# Patient Record
Sex: Female | Born: 1940
Health system: Southern US, Community
[De-identification: ages and names within clinical notes are randomized; demographics above are authoritative.]

## PROBLEM LIST (undated history)

## (undated) DIAGNOSIS — K635 Polyp of colon: Secondary | ICD-10-CM

## (undated) DIAGNOSIS — Z9289 Personal history of other medical treatment: Secondary | ICD-10-CM

## (undated) DIAGNOSIS — K649 Unspecified hemorrhoids: Secondary | ICD-10-CM

## (undated) DIAGNOSIS — M810 Age-related osteoporosis without current pathological fracture: Secondary | ICD-10-CM

## (undated) DIAGNOSIS — K219 Gastro-esophageal reflux disease without esophagitis: Secondary | ICD-10-CM

## (undated) HISTORY — DX: Polyp of colon: K63.5

## (undated) HISTORY — DX: Unspecified hemorrhoids: K64.9

## (undated) HISTORY — PX: FRACTURE SURGERY: SHX138

## (undated) HISTORY — DX: Gastro-esophageal reflux disease without esophagitis: K21.9

## (undated) HISTORY — PX: EYE SURGERY: SHX253

## (undated) HISTORY — DX: Personal history of other medical treatment: Z92.89

---

## 1998-11-25 ENCOUNTER — Other Ambulatory Visit: Admission: RE | Admit: 1998-11-25 | Discharge: 1998-11-25 | Payer: Self-pay | Admitting: Gynecology

## 2000-01-06 ENCOUNTER — Encounter: Payer: Self-pay | Admitting: Gynecology

## 2000-01-06 ENCOUNTER — Encounter: Admission: RE | Admit: 2000-01-06 | Discharge: 2000-01-06 | Payer: Self-pay | Admitting: Gynecology

## 2000-01-25 ENCOUNTER — Other Ambulatory Visit: Admission: RE | Admit: 2000-01-25 | Discharge: 2000-01-25 | Payer: Self-pay | Admitting: Gynecology

## 2001-05-11 ENCOUNTER — Encounter: Admission: RE | Admit: 2001-05-11 | Discharge: 2001-05-11 | Payer: Self-pay | Admitting: Gynecology

## 2001-05-11 ENCOUNTER — Encounter: Payer: Self-pay | Admitting: Gynecology

## 2001-05-11 ENCOUNTER — Other Ambulatory Visit: Admission: RE | Admit: 2001-05-11 | Discharge: 2001-05-11 | Payer: Self-pay | Admitting: Gynecology

## 2002-05-14 ENCOUNTER — Encounter: Payer: Self-pay | Admitting: Gynecology

## 2002-05-14 ENCOUNTER — Encounter: Admission: RE | Admit: 2002-05-14 | Discharge: 2002-05-14 | Payer: Self-pay | Admitting: Gynecology

## 2002-05-15 ENCOUNTER — Other Ambulatory Visit: Admission: RE | Admit: 2002-05-15 | Discharge: 2002-05-15 | Payer: Self-pay | Admitting: Gynecology

## 2003-05-20 ENCOUNTER — Encounter: Admission: RE | Admit: 2003-05-20 | Discharge: 2003-05-20 | Payer: Self-pay | Admitting: Gynecology

## 2003-06-05 ENCOUNTER — Other Ambulatory Visit: Admission: RE | Admit: 2003-06-05 | Discharge: 2003-06-05 | Payer: Self-pay | Admitting: Gynecology

## 2004-04-15 HISTORY — PX: ORIF TIBIA & FIBULA FRACTURES: SHX2131

## 2004-04-15 HISTORY — PX: ORIF ANKLE FRACTURE BIMALLEOLAR: SUR920

## 2004-04-25 ENCOUNTER — Emergency Department: Payer: Self-pay | Admitting: General Practice

## 2004-05-19 ENCOUNTER — Encounter: Payer: Self-pay | Admitting: Internal Medicine

## 2004-06-15 ENCOUNTER — Ambulatory Visit: Payer: Self-pay | Admitting: Internal Medicine

## 2004-06-17 ENCOUNTER — Encounter: Payer: Self-pay | Admitting: Internal Medicine

## 2004-06-18 ENCOUNTER — Ambulatory Visit: Payer: Self-pay | Admitting: Internal Medicine

## 2004-09-22 ENCOUNTER — Ambulatory Visit: Payer: Self-pay | Admitting: Internal Medicine

## 2004-11-27 ENCOUNTER — Encounter: Admission: RE | Admit: 2004-11-27 | Discharge: 2004-11-27 | Payer: Self-pay | Admitting: Gynecology

## 2004-11-27 ENCOUNTER — Other Ambulatory Visit: Admission: RE | Admit: 2004-11-27 | Discharge: 2004-11-27 | Payer: Self-pay | Admitting: Gynecology

## 2005-12-28 ENCOUNTER — Encounter: Admission: RE | Admit: 2005-12-28 | Discharge: 2005-12-28 | Payer: Self-pay | Admitting: Gynecology

## 2006-02-15 HISTORY — PX: CHOLECYSTECTOMY: SHX55

## 2006-07-15 ENCOUNTER — Ambulatory Visit: Payer: Self-pay | Admitting: Internal Medicine

## 2006-11-11 ENCOUNTER — Ambulatory Visit: Payer: Self-pay | Admitting: Surgery

## 2006-12-19 ENCOUNTER — Ambulatory Visit: Payer: Self-pay | Admitting: Urology

## 2007-01-04 ENCOUNTER — Encounter: Admission: RE | Admit: 2007-01-04 | Discharge: 2007-01-04 | Payer: Self-pay | Admitting: Gynecology

## 2007-01-05 ENCOUNTER — Other Ambulatory Visit: Admission: RE | Admit: 2007-01-05 | Discharge: 2007-01-05 | Payer: Self-pay | Admitting: Gynecology

## 2007-02-16 HISTORY — PX: COLONOSCOPY: SHX174

## 2007-05-16 ENCOUNTER — Ambulatory Visit: Payer: Self-pay | Admitting: Gastroenterology

## 2007-09-04 LAB — HM COLONOSCOPY

## 2008-01-08 ENCOUNTER — Encounter: Admission: RE | Admit: 2008-01-08 | Discharge: 2008-01-08 | Payer: Self-pay | Admitting: Gynecology

## 2009-01-15 ENCOUNTER — Encounter: Admission: RE | Admit: 2009-01-15 | Discharge: 2009-01-15 | Payer: Self-pay | Admitting: Gynecology

## 2010-03-07 ENCOUNTER — Encounter: Payer: Self-pay | Admitting: Gynecology

## 2010-03-08 ENCOUNTER — Encounter: Payer: Self-pay | Admitting: Gynecology

## 2010-05-13 ENCOUNTER — Other Ambulatory Visit: Payer: Self-pay | Admitting: Gynecology

## 2010-05-13 DIAGNOSIS — Z1231 Encounter for screening mammogram for malignant neoplasm of breast: Secondary | ICD-10-CM

## 2010-05-20 ENCOUNTER — Ambulatory Visit
Admission: RE | Admit: 2010-05-20 | Discharge: 2010-05-20 | Disposition: A | Discharge status: Home / Self Care | Payer: BC Managed Care – PPO | Source: Ambulatory Visit | Attending: Gynecology | Admitting: Gynecology

## 2010-05-20 DIAGNOSIS — Z1231 Encounter for screening mammogram for malignant neoplasm of breast: Secondary | ICD-10-CM

## 2011-03-12 ENCOUNTER — Telehealth: Payer: Self-pay | Admitting: Internal Medicine

## 2011-03-12 MED ORDER — CEPHALEXIN 500 MG PO CAPS: 500.0000 mg | ORAL_CAPSULE | Freq: Three times a day (TID) | ORAL | Status: AC

## 2011-03-12 NOTE — Telephone Encounter (Signed)
Pt has a painful swollen bump on her right leg, where she had her surgery years ago.  This has been going on for one week.  Feels hot to the touch. No fever.  Advised her she needs to have this checked but she wants to wait until she can see you, she doesn't want to go out anywhere today.  Uses [facility] [address].

## 2011-03-12 NOTE — Telephone Encounter (Signed)
She needs to be on an antibiotic,  I will send on ein to  her pharmacy

## 2011-03-12 NOTE — Telephone Encounter (Signed)
Since

## 2011-03-12 NOTE — Telephone Encounter (Signed)
Advised pt, she states she can take pcn.  Keflex called to [facility].

## 2011-03-12 NOTE — Telephone Encounter (Signed)
Actually, we have none of her old records,  So please find out if she can take peniciliin,  If yes,  Keflex   500 mg three times daioy  X 7 days,  #21 no refills.   If she has a PCN allergy,  confrim that she can take sulfa  And call in Septra DS one tablet twice daily   X 7 days  #14 no refills.

## 2011-03-12 NOTE — Telephone Encounter (Signed)
Patient thinks she has an infection in her leg from her operation.

## 2011-04-13 ENCOUNTER — Other Ambulatory Visit: Payer: Self-pay | Admitting: Internal Medicine

## 2011-04-29 ENCOUNTER — Other Ambulatory Visit: Payer: Self-pay | Admitting: Gynecology

## 2011-04-29 DIAGNOSIS — Z1231 Encounter for screening mammogram for malignant neoplasm of breast: Secondary | ICD-10-CM

## 2011-05-12 DIAGNOSIS — S82899A Other fracture of unspecified lower leg, initial encounter for closed fracture: Secondary | ICD-10-CM | POA: Insufficient documentation

## 2011-05-21 ENCOUNTER — Ambulatory Visit
Admission: RE | Admit: 2011-05-21 | Discharge: 2011-05-21 | Disposition: A | Discharge status: Home / Self Care | Payer: BC Managed Care – PPO | Source: Ambulatory Visit | Attending: Gynecology | Admitting: Gynecology

## 2011-05-21 DIAGNOSIS — Z1231 Encounter for screening mammogram for malignant neoplasm of breast: Secondary | ICD-10-CM

## 2011-06-03 LAB — HM MAMMOGRAPHY: HM Mammogram: NORMAL

## 2011-06-15 LAB — HM PAP SMEAR: HM Pap smear: NORMAL

## 2011-09-01 ENCOUNTER — Ambulatory Visit (INDEPENDENT_AMBULATORY_CARE_PROVIDER_SITE_OTHER): Payer: Medicare Other | Admitting: Internal Medicine

## 2011-09-01 ENCOUNTER — Encounter: Payer: Self-pay | Admitting: Internal Medicine

## 2011-09-01 VITALS — BP 110/62 | HR 70 | Temp 97.9°F | Ht <= 58 in | Wt 108.0 lb

## 2011-09-01 DIAGNOSIS — Z1239 Encounter for other screening for malignant neoplasm of breast: Secondary | ICD-10-CM

## 2011-09-01 DIAGNOSIS — M542 Cervicalgia: Secondary | ICD-10-CM

## 2011-09-01 DIAGNOSIS — Z860100 Personal history of colon polyps, unspecified: Secondary | ICD-10-CM

## 2011-09-01 DIAGNOSIS — K219 Gastro-esophageal reflux disease without esophagitis: Secondary | ICD-10-CM

## 2011-09-01 DIAGNOSIS — R030 Elevated blood-pressure reading, without diagnosis of hypertension: Secondary | ICD-10-CM | POA: Insufficient documentation

## 2011-09-01 NOTE — Patient Instructions (Addendum)
Your neck pain could be from arthrits, a bulging disk, or a bone spur   You can take alleve and tylenol together to relieve your pain (tylenol maximum 2000 daily )  We will probably end up getting an MRI after your x rays, but we will see.    You do not have blood pressure  Sign a medical release form for [employer], Trauma Surgeon ,
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
## 2011-09-03 ENCOUNTER — Ambulatory Visit (INDEPENDENT_AMBULATORY_CARE_PROVIDER_SITE_OTHER)
Admission: RE | Admit: 2011-09-03 | Discharge: 2011-09-03 | Disposition: A | Discharge status: Home / Self Care | Payer: Medicare Other | Source: Ambulatory Visit | Attending: Internal Medicine | Admitting: Internal Medicine

## 2011-09-03 DIAGNOSIS — M542 Cervicalgia: Secondary | ICD-10-CM
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
## 2011-09-04 ENCOUNTER — Encounter: Payer: Self-pay | Admitting: Internal Medicine

## 2011-09-04 DIAGNOSIS — K219 Gastro-esophageal reflux disease without esophagitis: Secondary | ICD-10-CM | POA: Insufficient documentation

## 2011-09-04 DIAGNOSIS — Z1239 Encounter for other screening for malignant neoplasm of breast: Secondary | ICD-10-CM | POA: Insufficient documentation

## 2011-09-04 DIAGNOSIS — Z86010 Personal history of colonic polyps: Secondary | ICD-10-CM | POA: Insufficient documentation

## 2011-09-04 NOTE — Assessment & Plan Note (Signed)
Reassurance provided that she does not have hypertension or need for treatment at this time.

## 2011-09-04 NOTE — Assessment & Plan Note (Signed)
She was once daily Nexium. No symptoms currently.

## 2011-09-04 NOTE — Assessment & Plan Note (Signed)
Managed with annual mammograms. Last one normal April 2013

## 2011-09-04 NOTE — Assessment & Plan Note (Signed)
Her last colonoscopy with visit was in 2009 by Drs. Gwenlyn Fudge and was incomplete as he could not traverse past the splenic flexure. She did a sigmoid polyp removed at that time she was referred for double contrast barium enema which he did not do. Warm and her to have colonoscopy again next year which would be periodic of 5 years.

## 2011-09-10 ENCOUNTER — Telehealth: Payer: Self-pay | Admitting: Internal Medicine

## 2011-09-10 NOTE — Telephone Encounter (Signed)
Patient spoke to [place] regarding this and has already been taken care of.

## 2011-09-10 NOTE — Telephone Encounter (Signed)
Caller: Hiedi/Patient; PCP: Duncan Dull; CB#: [phone]; ; ; Call regarding Wants To Speak With Dr. Darrick Huntsman Regarding Her XRAY. States That She Has Missed Several Calls Fannie Knee To [employer] "It is very important that I speak with someone, my phone keeps losing signal and I have gone throught this several times."  Spoke to [place] from office conferenced in with her per pt request.

## 2011-09-15 ENCOUNTER — Encounter: Payer: Self-pay | Admitting: Internal Medicine

## 2011-09-15 DIAGNOSIS — K219 Gastro-esophageal reflux disease without esophagitis: Secondary | ICD-10-CM

## 2011-09-15 DIAGNOSIS — M810 Age-related osteoporosis without current pathological fracture: Secondary | ICD-10-CM

## 2011-09-15 DIAGNOSIS — I10 Essential (primary) hypertension: Secondary | ICD-10-CM

## 2011-09-15 DIAGNOSIS — E785 Hyperlipidemia, unspecified: Secondary | ICD-10-CM | POA: Insufficient documentation

## 2011-09-15 DIAGNOSIS — Z8781 Personal history of (healed) traumatic fracture: Secondary | ICD-10-CM

## 2011-09-15 NOTE — Assessment & Plan Note (Signed)
Controlled only with Nexium.  Prior treatment failures with protonix, prevacid and omeprazole.

## 2011-09-28 ENCOUNTER — Encounter: Payer: Self-pay | Admitting: Internal Medicine

## 2011-10-06 ENCOUNTER — Encounter: Payer: Self-pay | Admitting: Internal Medicine

## 2011-10-06 ENCOUNTER — Ambulatory Visit (INDEPENDENT_AMBULATORY_CARE_PROVIDER_SITE_OTHER): Payer: Medicare Other | Admitting: Internal Medicine

## 2011-10-06 VITALS — BP 112/78 | HR 82 | Temp 98.3°F | Wt 110.0 lb

## 2011-10-06 DIAGNOSIS — R5383 Other fatigue: Secondary | ICD-10-CM

## 2011-10-06 DIAGNOSIS — M542 Cervicalgia: Secondary | ICD-10-CM

## 2011-10-06 DIAGNOSIS — R5381 Other malaise: Secondary | ICD-10-CM

## 2011-10-06 DIAGNOSIS — E785 Hyperlipidemia, unspecified: Secondary | ICD-10-CM

## 2011-10-06 DIAGNOSIS — Z86010 Personal history of colonic polyps: Secondary | ICD-10-CM

## 2011-10-06 NOTE — Patient Instructions (Addendum)
We will schedule you for an MRI of your neck to be done at [facility]  Return for fasting labs (no food since midnight,  But please drink water)

## 2011-10-06 NOTE — Progress Notes (Signed)
Patient ID: DEVONE BONILLA, female   DOB: 02/21/1940, 71 y.o.   MRN: [id number]  Patient Active Problem List  Diagnosis  . Neck pain on left side  . GERD (gastroesophageal reflux disease)  . History of colonic polyps  . Screening for breast cancer  . History of pelvic fracture  . Hyperlipidemia LDL goal < 130  . Hypertension  . Osteoporosis, postmenopausal    Subjective:  CC:   Chief Complaint  Patient presents with  . Neck Pain    HPI:   Calise Dunckel Bittmannis a 71 y.o. female who presents for follow up on Persistent left sided neck pain evaluated one month ago with plain films of cervical spine which indicated moderate degenerative changes.  Her pain has not improved with time. He continues to radiate to the left occipital area and left shoulder. It has become worse over the last several days. She was referred for physical therapy at last visit on July 17 but never got a call from our office or from [facility] Has moments where she is pain-free followed by flashes of pain that occur both with sudden movements of her head and without seeming provocation.   Sleeps supine, has a pillow with neck support,  Occasionally aggravated by vertigo.    Past Medical History  Diagnosis Date  . GERD (gastroesophageal reflux disease)     Past Surgical History  Procedure Date  . Fracture surgery     right tibia  . Cholecystectomy 2008  . Orif tibia & fibula fractures March 2006  . Orif ankle fracture bimalleolar March 2006         The following portions of the patient's history were reviewed and updated as appropriate: Allergies, current medications, and problem list.    Review of Systems:   A comprehensive ROS was done and positive for neck and shoulder pain, and axiety without insomnia or panic attacks.   The rest was negative.    History   Social History  . Marital Status: Divorced    Spouse Name: N/A    Number of Children: N/A  . Years of Education: N/A   Occupational  History  . Not on file.   Social History Main Topics  . Smoking status: Never Smoker   . Smokeless tobacco: Not on file  . Alcohol Use: Not on file  . Drug Use: Not on file  . Sexually Active: Not on file   Other Topics Concern  . Not on file   Social History Narrative  . No narrative on file    Objective:  BP 112/78  Pulse 82  Temp 98.3 F (36.8 C) (Oral)  Wt 110 lb (49.896 kg)  SpO2 98%  General appearance: alert, cooperative and appears stated age Ears: normal TM's and external ear canals both ears Throat: lips, mucosa, and tongue normal; teeth and gums normal Neck: no adenopathy, no carotid bruit, supple, symmetrical, trachea midline and thyroid not enlarged, symmetric, no tenderness/mass/nodules Back: symmetric, no curvature. ROM normal. No CVA tenderness. Lungs: clear to auscultation bilaterally Heart: regular rate and rhythm, S1, S2 normal, no murmur, click, rub or gallop Abdomen: soft, non-tender; bowel sounds normal; no masses,  no organomegaly Pulses: 2+ and symmetric Skin: Skin color, texture, turgor normal. No rashes or lesions Lymph nodes: Cervical, supraclavicular, and axillary nodes normal.  Assessment and Plan:  Neck pain on left side Secondary to cervical disc disease. We discussed obtaining an MRI of her cervical spine since she does appear to be having radiculopathy.  Continue nonsteroidal anti-inflammatories and recommended a cervical pillow to support neck during sleep. Referral for physical therapy was canceled.  History of colonic polyps She is due for some type for repeat screening. Her last colonoscopy was incomplete and she did not followup with the double contrasted barium enema which was ordered for her. She does not want to consider this at this time. We will resume screening with annual fecal occult blood tests.   Updated Medication List Outpatient Encounter Prescriptions as of 10/06/2011  Medication Sig Dispense Refill  . aspirin 81 MG  tablet Take 81 mg by mouth daily.      . Cholecalciferol (VITAMIN D3) 1000 UNITS CAPS Take by mouth.      Marland Kitchen GARLIC PO Take 1 capsule by mouth daily.      . Melatonin 5 MG TABS Take 1 tablet by mouth as needed.      . Misc Natural Products (OSTEO BI-FLEX TRIPLE STRENGTH) TABS Take 1 tablet by mouth daily.      . Multiple Vitamins-Minerals (CENTRUM SILVER PO) Take 1 tablet by mouth daily.      Marland Kitchen NEXIUM 40 MG capsule TAKE ONE CAPSULE BY MOUTH EVERY DAY  90 each  3  . VALERIAN ROOT PO Take 1 capsule by mouth as needed.      . vitamin B-12 (CYANOCOBALAMIN) 1000 MCG tablet Take 1,000 mcg by mouth daily.      . vitamin C (ASCORBIC ACID) 500 MG tablet Take 500 mg by mouth daily.         Orders Placed This Encounter  Procedures  . MR Cervical Spine Wo Contrast  . Lipid panel  . Comprehensive metabolic panel  . TSH  . CBC with Differential    No Follow-up on file.

## 2011-10-09 ENCOUNTER — Encounter: Payer: Self-pay | Admitting: Internal Medicine

## 2011-10-09 NOTE — Assessment & Plan Note (Signed)
She is due for some type for repeat screening. Her last colonoscopy was incomplete and she did not followup with the double contrasted barium enema which was ordered for her. She does not want to consider this at this time. We will resume screening with annual fecal occult blood tests.

## 2011-10-09 NOTE — Assessment & Plan Note (Addendum)
Secondary to cervical disc disease. We discussed obtaining an MRI of her cervical spine since she does appear to be having radiculopathy. Continue nonsteroidal anti-inflammatories and recommended a cervical pillow to support neck during sleep. Referral for physical therapy was canceled.

## 2011-10-11 ENCOUNTER — Ambulatory Visit (HOSPITAL_COMMUNITY)
Admission: RE | Admit: 2011-10-11 | Discharge: 2011-10-11 | Disposition: A | Discharge status: Home / Self Care | Payer: Medicare Other | Source: Ambulatory Visit | Attending: Internal Medicine | Admitting: Internal Medicine

## 2011-10-11 DIAGNOSIS — M542 Cervicalgia: Secondary | ICD-10-CM | POA: Insufficient documentation

## 2011-10-11 DIAGNOSIS — M5020 Other cervical disc displacement, unspecified cervical region: Secondary | ICD-10-CM | POA: Insufficient documentation

## 2011-10-11 DIAGNOSIS — M5030 Other cervical disc degeneration, unspecified cervical region: Secondary | ICD-10-CM | POA: Insufficient documentation

## 2011-10-12 ENCOUNTER — Other Ambulatory Visit: Payer: Self-pay | Admitting: Internal Medicine

## 2011-10-12 DIAGNOSIS — M4802 Spinal stenosis, cervical region: Secondary | ICD-10-CM

## 2011-10-14 ENCOUNTER — Telehealth: Payer: Self-pay | Admitting: Internal Medicine

## 2011-10-14 NOTE — Telephone Encounter (Signed)
Patient anxious to know her MRI results. Please call.

## 2011-10-14 NOTE — Telephone Encounter (Signed)
She has been called two different times and messages have been left for her.  I will try getting in touch with her again.

## 2011-10-15 ENCOUNTER — Other Ambulatory Visit (INDEPENDENT_AMBULATORY_CARE_PROVIDER_SITE_OTHER): Payer: Medicare Other | Admitting: *Deleted

## 2011-10-15 DIAGNOSIS — R5381 Other malaise: Secondary | ICD-10-CM

## 2011-10-15 DIAGNOSIS — R5383 Other fatigue: Secondary | ICD-10-CM

## 2011-10-15 DIAGNOSIS — E785 Hyperlipidemia, unspecified: Secondary | ICD-10-CM

## 2011-10-15 LAB — COMPREHENSIVE METABOLIC PANEL
Albumin: 4.1 g/dL (ref 3.5–5.2)
BUN: 27 mg/dL — ABNORMAL HIGH (ref 6–23)
CO2: 26 mEq/L (ref 19–32)
Chloride: 106 mEq/L (ref 96–112)
Creatinine, Ser: 0.8 mg/dL (ref 0.4–1.2)
Glucose, Bld: 79 mg/dL (ref 70–99)
Potassium: 3.8 mEq/L (ref 3.5–5.1)
Sodium: 139 mEq/L (ref 135–145)

## 2011-10-15 LAB — CBC WITH DIFFERENTIAL/PLATELET
Basophils Absolute: 0 10*3/uL (ref 0.0–0.1)
Basophils Relative: 0.4 % (ref 0.0–3.0)
Eosinophils Absolute: 0.4 10*3/uL (ref 0.0–0.7)
Eosinophils Relative: 5.2 % — ABNORMAL HIGH (ref 0.0–5.0)
HCT: 37.9 % (ref 36.0–46.0)
Lymphocytes Relative: 28.1 % (ref 12.0–46.0)
Monocytes Absolute: 0.7 10*3/uL (ref 0.1–1.0)
Monocytes Relative: 9.7 % (ref 3.0–12.0)
Neutro Abs: 4.2 10*3/uL (ref 1.4–7.7)
Neutrophils Relative %: 56.6 % (ref 43.0–77.0)

## 2011-10-15 LAB — LIPID PANEL
Cholesterol: 184 mg/dL (ref 0–200)
LDL Cholesterol: 106 mg/dL — ABNORMAL HIGH (ref 0–99)
Total CHOL/HDL Ratio: 4
Triglycerides: 136 mg/dL (ref 0.0–149.0)

## 2012-07-27 ENCOUNTER — Other Ambulatory Visit: Payer: Self-pay | Admitting: *Deleted

## 2012-07-27 NOTE — Telephone Encounter (Signed)
Another doctor had prescribed this but he has retired, was not on her med list. Ok to refill?

## 2012-07-28 MED ORDER — IBANDRONATE SODIUM 150 MG PO TABS: 150.0000 mg | ORAL_TABLET | ORAL | Status: DC

## 2012-07-28 NOTE — Telephone Encounter (Signed)
Pt has appointment already scheduled 08/15/12.

## 2012-07-28 NOTE — Telephone Encounter (Signed)
Yes,  Please get her to schedule her annual wellness exam.  She will need 45 minutes. fastinjg labs same day if she can have a morning appt.  Otherwise labs to be scheduled after the day of the visit.

## 2012-08-15 ENCOUNTER — Encounter: Payer: Self-pay | Admitting: Internal Medicine

## 2012-08-15 ENCOUNTER — Ambulatory Visit (INDEPENDENT_AMBULATORY_CARE_PROVIDER_SITE_OTHER): Payer: Medicare Other | Admitting: Internal Medicine

## 2012-08-15 VITALS — BP 116/78 | HR 67 | Temp 97.6°F | Resp 14 | Ht <= 58 in | Wt 107.8 lb

## 2012-08-15 DIAGNOSIS — Z860100 Personal history of colon polyps, unspecified: Secondary | ICD-10-CM

## 2012-08-15 DIAGNOSIS — I10 Essential (primary) hypertension: Secondary | ICD-10-CM

## 2012-08-15 DIAGNOSIS — E785 Hyperlipidemia, unspecified: Secondary | ICD-10-CM

## 2012-08-15 DIAGNOSIS — Z0000 Encounter for general adult medical examination without abnormal findings: Secondary | ICD-10-CM

## 2012-08-15 DIAGNOSIS — R5381 Other malaise: Secondary | ICD-10-CM

## 2012-08-15 DIAGNOSIS — K219 Gastro-esophageal reflux disease without esophagitis: Secondary | ICD-10-CM

## 2012-08-15 DIAGNOSIS — M542 Cervicalgia: Secondary | ICD-10-CM

## 2012-08-15 DIAGNOSIS — Z1211 Encounter for screening for malignant neoplasm of colon: Secondary | ICD-10-CM

## 2012-08-15 DIAGNOSIS — Z23 Encounter for immunization: Secondary | ICD-10-CM

## 2012-08-15 DIAGNOSIS — E559 Vitamin D deficiency, unspecified: Secondary | ICD-10-CM

## 2012-08-15 DIAGNOSIS — R5383 Other fatigue: Secondary | ICD-10-CM

## 2012-08-15 DIAGNOSIS — M810 Age-related osteoporosis without current pathological fracture: Secondary | ICD-10-CM

## 2012-08-15 DIAGNOSIS — E538 Deficiency of other specified B group vitamins: Secondary | ICD-10-CM

## 2012-08-15 DIAGNOSIS — Z1239 Encounter for other screening for malignant neoplasm of breast: Secondary | ICD-10-CM

## 2012-08-15 LAB — VITAMIN B12: Vitamin B-12: 1476 pg/mL — ABNORMAL HIGH (ref 211–911)

## 2012-08-15 LAB — COMPREHENSIVE METABOLIC PANEL
Albumin: 4.5 g/dL (ref 3.5–5.2)
BUN: 29 mg/dL — ABNORMAL HIGH (ref 6–23)
CO2: 30 mEq/L (ref 19–32)
Calcium: 10.2 mg/dL (ref 8.4–10.5)
Chloride: 102 mEq/L (ref 96–112)
GFR: 85.57 mL/min (ref >60.00)
Glucose, Bld: 92 mg/dL (ref 70–99)
Potassium: 4.5 mEq/L (ref 3.5–5.1)
Sodium: 137 mEq/L (ref 135–145)
Total Protein: 7.7 g/dL (ref 6.0–8.3)

## 2012-08-15 LAB — CBC WITH DIFFERENTIAL/PLATELET
Basophils Absolute: 0.1 10*3/uL (ref 0.0–0.1)
Basophils Relative: 0.9 % (ref 0.0–3.0)
Eosinophils Absolute: 0.3 10*3/uL (ref 0.0–0.7)
Lymphocytes Relative: 24.1 % (ref 12.0–46.0)
MCHC: 33.8 g/dL (ref 30.0–36.0)
MCV: 92.7 fl (ref 78.0–100.0)
Monocytes Absolute: 0.6 10*3/uL (ref 0.1–1.0)
Neutrophils Relative %: 65.6 % (ref 43.0–77.0)
Platelets: 273 10*3/uL (ref 150.0–400.0)
RBC: 4.3 Mil/uL (ref 3.87–5.11)
RDW: 13.5 % (ref 11.5–14.6)

## 2012-08-15 MED ORDER — TETANUS-DIPHTH-ACELL PERTUSSIS 5-2.5-18.5 LF-MCG/0.5 IM SUSP: 0.5000 mL | Freq: Once | INTRAMUSCULAR | Status: DC

## 2012-08-15 MED ORDER — ZOSTER VACCINE LIVE 19400 UNT/0.65ML ~~LOC~~ SOLR: 0.6500 mL | Freq: Once | SUBCUTANEOUS | Status: DC

## 2012-08-15 NOTE — Assessment & Plan Note (Addendum)
taking boniva generic ,  Prior dexa scans have been donen in the office of her former gynecologist  Dr Nicholas Lose.  There has been no major improvement in bone density but she has had no history of nontraumatic fractures.  She is not exercising regularly  and is curious about Silver Sneakers.  She does some exercise at home with low weights and dancing.  Dr. Nicholas Lose is retiring and she wants to continue getting her DEXA scans at his office for continuity, not at [facility] . She has stopped taking daily Nexium  Because of her concern about its effects on her bone density and is taking a spoonful of apple cider vinegar occasionally and one nexium per week

## 2012-08-15 NOTE — Patient Instructions (Addendum)
You do not have a pelvic exam every year. I will do one next year We no longer need to do PAP smears because of your age.   We will try to get your Bone Density test done this year by Dr Johnn Hai office.  If no, e will get his records and try to find a similar machine so the reports look  similar.   We will schedule your mammogram at [facility]   You had your annual Medicare wellness exam today  You need to have a TDaP vaccine and a Shingles vaccine.  I have given you prescriptions for thses because they will be cheaper at the health Dept or at your local pharmacy because Medicare will not reimburse for them.   You received the pneumonia vaccine today.  We will contact you with the bloodwork results

## 2012-08-15 NOTE — Progress Notes (Signed)
Patient ID: Natalie Coleman, female   DOB: 17-May-1940, 72 y.o.   MRN: [id number] The patient is here for annual Medicare wellness examination and management of other chronic and acute problems.  Had pelvic last year by [facility].  Not sexually active.,     The risk factors are reflected in the social history.  The roster of all physicians providing medical care to patient - is listed in the Snapshot section of the chart.  Activities of daily living:  The patient is 100% independent in all ADLs: dressing, toileting, feeding as well as independent mobility  Home safety : The patient has smoke detectors in the home. They wear seatbelts.  There are no firearms at home. There is no violence in the home.   There is no risks for hepatitis, STDs or HIV. There is no   history of blood transfusion. They have no travel history to infectious disease endemic areas of the world.  The patient has seen their dentist in the last six month. They have seen their eye doctor in the last year. They admit to slight hearing difficulty with regard to whispered voices and some television programs.  They have deferred audiologic testing in the last year.  They do not  have excessive sun exposure. Discussed the need for sun protection: hats, long sleeves and use of sunscreen if there is significant sun exposure.   Diet: the importance of a healthy diet is discussed. They do have a healthy diet.  The benefits of regular aerobic exercise were discussed. She walks 4 times per week ,  20 minutes.   Depression screen: there are no signs or vegative symptoms of depression- irritability, change in appetite, anhedonia, sadness/tearfullness.  Cognitive assessment: the patient manages all their financial and personal affairs and is actively engaged. They could relate day,date,year and events; recalled 2/3 objects at 3 minutes; performed clock-face test normally.  The following portions of the patient's history were reviewed and  updated as appropriate: allergies, current medications, past family history, past medical history,  past surgical history, past social history  and problem list.  Visual acuity was not assessed per patient preference since she has regular follow up with her ophthalmologist. Hearing and body mass index were assessed and reviewed.   During the course of the visit the patient was educated and counseled about appropriate screening and preventive services including : fall prevention , diabetes screening, nutrition counseling, colorectal cancer screening, and recommended immunizations.    Objective:  BP 116/78  Pulse 67  Temp(Src) 97.6 F (36.4 C) (Oral)  Resp 14  Ht 4\' 6"  (1.372 m)  Wt 107 lb 12 oz (48.875 kg)  BMI 25.96 kg/m2  SpO2 98%  General appearance: alert, cooperative and appears stated age Ears: normal TM's and external ear canals both ears Throat: lips, mucosa, and tongue normal; teeth and gums normal Neck: no adenopathy, no carotid bruit, supple, symmetrical, trachea midline and thyroid not enlarged, symmetric, no tenderness/mass/nodules Back: symmetric, no curvature. ROM normal. No CVA tenderness. Lungs: clear to auscultation bilaterally Heart: regular rate and rhythm, S1, S2 normal, no murmur, click, rub or gallop Abdomen: soft, non-tender; bowel sounds normal; no masses,  no organomegaly Pulses: 2+ and symmetric Skin: Skin color, texture, turgor normal. No rashes or lesions Lymph nodes: Cervical, supraclavicular, and axillary nodes normal.  Assessment and Plan:  Osteoporosis, postmenopausal taking boniva generic ,  Prior dexa scans have been donen in the office of her former gynecologist  Dr Nicholas Lose.  There has been  no major improvement in bone density but she has had no history of nontraumatic fractures.  She is not exercising regularly  and is curious about Silver Sneakers.  She does some exercise at home with low weights and dancing.  Dr. Nicholas Lose is retiring and she wants to  continue getting her DEXA scans at his office for continuity, not at [facility] . She has stopped taking daily Nexium  Because of her concern about its effects on her bone density and is taking a spoonful of apple cider vinegar occasionally and one nexium per week   History of colonic polyps She is due for some type for repeat screening. Her last colonoscopy was incomplete and she did not followup with the double contrasted barium enema which was ordered for her. She does not want to consider this at this time.  We have resumed screening with annual fecal occult blood tests.    Neck pain on left side Secondary to DDD of cervical spine without spinal cord or nerve root impingement symptoms. She does not want intervention. Reassurance provided  Hypertension     GERD (gastroesophageal reflux disease) Despite prior treatment failures with protonix, prevacid and omeprazole, she is asymptomatic on reduced dose of nexium to weekly dosing an dis using cider vinegar prn due to concerns about Nexium's potential effect on her bone density  Screening for breast cancer Continue annual mammograms  Hyperlipidemia LDL goal < 130 LDL and triglycerides have been acceptable on current diet, she is exercising regularly and liver enzymes are normal. No changes today   Routine general medical examination at a health care facility Annual comprehensive exam was done including breast exam .  Pelvic was deferred, as she had it done last year  By [facility] and is not sexually active. All screenings have been addressed .    Updated Medication List Outpatient Encounter Prescriptions as of 08/15/2012  Medication Sig Dispense Refill  . aspirin 81 MG tablet Take 81 mg by mouth daily.      . Cholecalciferol (VITAMIN D3) 1000 UNITS CAPS Take by mouth.      Marland Kitchen GARLIC PO Take 1 capsule by mouth daily.      Marland Kitchen ibandronate (BONIVA) 150 MG tablet Take 1 tablet (150 mg total) by mouth every 30 (thirty) days.  12 tablet  1  .  Melatonin 5 MG TABS Take 1 tablet by mouth as needed.      . Misc Natural Products (OSTEO BI-FLEX TRIPLE STRENGTH) TABS Take 1 tablet by mouth daily.      . Multiple Vitamins-Minerals (CENTRUM SILVER PO) Take 1 tablet by mouth daily.      Marland Kitchen VALERIAN ROOT PO Take 1 capsule by mouth as needed.      . vitamin B-12 (CYANOCOBALAMIN) 1000 MCG tablet Take 1,000 mcg by mouth daily.      . vitamin C (ASCORBIC ACID) 500 MG tablet Take 500 mg by mouth daily.      Marland Kitchen NEXIUM 40 MG capsule TAKE ONE CAPSULE BY MOUTH EVERY DAY  90 each  3  . TDaP (BOOSTRIX) 5-2.5-18.5 LF-MCG/0.5 injection Inject 0.5 mLs into the muscle once.  0.5 mL  0  . zoster vaccine live, PF, (ZOSTAVAX) 16109 UNT/0.65ML injection Inject 19,400 Units into the skin once.  1 each  0   No facility-administered encounter medications on file as of 08/15/2012.

## 2012-08-16 ENCOUNTER — Encounter: Payer: Self-pay | Admitting: *Deleted

## 2012-08-16 LAB — VITAMIN D 25 HYDROXY (VIT D DEFICIENCY, FRACTURES): Vit D, 25-Hydroxy: 63 ng/mL (ref 30–89)

## 2012-08-18 DIAGNOSIS — Z0000 Encounter for general adult medical examination without abnormal findings: Secondary | ICD-10-CM | POA: Insufficient documentation

## 2012-08-18 NOTE — Assessment & Plan Note (Signed)
Annual comprehensive exam was done including breast exam .  Pelvic was deferred, as she had it done last year  By [facility] and is not sexually active. All screenings have been addressed .

## 2012-08-18 NOTE — Assessment & Plan Note (Addendum)
Secondary to DDD of cervical spine without spinal cord or nerve root impingement symptoms. She does not want intervention. Reassurance provided

## 2012-08-18 NOTE — Assessment & Plan Note (Signed)
Continue annual mammograms  

## 2012-08-18 NOTE — Assessment & Plan Note (Signed)
She is due for some type for repeat screening. Her last colonoscopy was incomplete and she did not followup with the double contrasted barium enema which was ordered for her. She does not want to consider this at this time.  We have resumed screening with annual fecal occult blood tests.

## 2012-08-18 NOTE — Assessment & Plan Note (Signed)
Despite prior treatment failures with protonix, prevacid and omeprazole, she is asymptomatic on reduced dose of nexium to weekly dosing an dis using cider vinegar prn due to concerns about Nexium's potential effect on her bone density

## 2012-08-18 NOTE — Assessment & Plan Note (Signed)
LDL and triglycerides have been acceptable on current diet, she is exercising regularly and liver enzymes are normal. No changes today

## 2012-09-04 ENCOUNTER — Other Ambulatory Visit: Payer: Self-pay | Admitting: Internal Medicine

## 2012-09-04 DIAGNOSIS — Z1231 Encounter for screening mammogram for malignant neoplasm of breast: Secondary | ICD-10-CM

## 2012-09-27 ENCOUNTER — Ambulatory Visit
Admission: RE | Admit: 2012-09-27 | Discharge: 2012-09-27 | Disposition: A | Discharge status: Home / Self Care | Payer: Medicare Other | Source: Ambulatory Visit | Attending: Internal Medicine | Admitting: Internal Medicine

## 2012-09-27 DIAGNOSIS — Z1231 Encounter for screening mammogram for malignant neoplasm of breast: Secondary | ICD-10-CM

## 2013-02-20 ENCOUNTER — Ambulatory Visit (INDEPENDENT_AMBULATORY_CARE_PROVIDER_SITE_OTHER): Payer: Medicare Other | Admitting: Internal Medicine

## 2013-02-20 ENCOUNTER — Encounter: Payer: Self-pay | Admitting: Internal Medicine

## 2013-02-20 VITALS — BP 118/72 | HR 82 | Temp 98.1°F | Resp 14 | Ht <= 58 in | Wt 110.5 lb

## 2013-02-20 DIAGNOSIS — K649 Unspecified hemorrhoids: Secondary | ICD-10-CM

## 2013-02-20 DIAGNOSIS — Z86010 Personal history of colonic polyps: Secondary | ICD-10-CM

## 2013-02-20 DIAGNOSIS — I10 Essential (primary) hypertension: Secondary | ICD-10-CM

## 2013-02-20 DIAGNOSIS — M810 Age-related osteoporosis without current pathological fracture: Secondary | ICD-10-CM

## 2013-02-20 NOTE — Progress Notes (Signed)
Patient ID: Natalie Coleman, female   DOB: Dec 11, 1940, 73 y.o.   MRN: [id number]  Patient Active Problem List   Diagnosis Date Noted  . Bleeding hemorrhoid 02/21/2013  . Routine general medical examination at a health care facility 08/18/2012  . History of pelvic fracture 09/15/2011  . Hyperlipidemia LDL goal < 130 09/15/2011  . Osteoporosis, postmenopausal 09/15/2011  . History of colonic polyps 09/04/2011  . Screening for breast cancer 09/04/2011  . GERD (gastroesophageal reflux disease)   . Neck pain on left side 09/01/2011    Subjective:  CC:   Chief Complaint  Patient presents with  . Follow-up    Last OV 08/15/2012    HPI:   Natalie Ahn Bittmannis a 73 y.o. female who presents for f Follow up on chronic coniditions and new onset hemorrhoidal bleeding.  She has been  Having episodes of minimal rectal bleeding when she wipesafter having a bowel movement.  Feels a slight bulge at the rectum but denies rectal pain.  Occasional constipation when diet is not full of vegetbale but has not had to use laxatives.    Natalie Coleman called her recently  to set up colonoscopy. She has not called them back yet bc she want to talk to a friend about the doctors there.  She had a horrible experience during one of her colonoscopies due to incomplete prep due to recurrent vomiting during prep, pre was incomplete and she woke up soiled.  Review of [facility] records indicate Natalie Coleman did a colonoscopy in 2009 which was incompletel due to difficulty advancing past the splenic flexure.  Air contrast barium enema was advised.  Prior to that her last colonoscopy was 2004 by Natalie Coleman.   Past Medical History  Diagnosis Date  . GERD (gastroesophageal reflux disease)     Past Surgical History  Procedure Laterality Date  . Fracture surgery      right tibia  . Cholecystectomy  2008  . Orif tibia & fibula fractures  March 2006  . Orif ankle fracture bimalleolar  March 2006       The  following portions of the patient's history were reviewed and updated as appropriate: Allergies, current medications, and problem list.    Review of Systems:  Patient denies headache, fevers, malaise, unintentional weight loss, skin rash, eye pain, sinus congestion and sinus pain, sore throat, dysphagia,  hemoptysis , cough, dyspnea, wheezing, chest pain, palpitations, orthopnea, edema, abdominal pain, nausea, melena, diarrhea, constipation, flank pain, dysuria, hematuria, urinary  Frequency, nocturia, numbness, tingling, seizures,  Focal weakness, Loss of consciousness,  Tremor, insomnia, depression, anxiety, and suicidal ideation.     History   Social History  . Marital Status: Divorced    Spouse Name: N/A    Number of Children: N/A  . Years of Education: N/A   Occupational History  . Not on file.   Social History Main Topics  . Smoking status: Never Smoker   . Smokeless tobacco: Not on file  . Alcohol Use: Not on file  . Drug Use: Not on file  . Sexual Activity: Not on file   Other Topics Concern  . Not on file   Social History Narrative  . No narrative on file    Objective:  Filed Vitals:   02/20/13 1352  BP: 118/72  Pulse: 82  Temp: 98.1 F (36.7 C)  Resp: 14     General appearance: alert, cooperative and appears stated age Ears: normal TM's and external ear canals both ears Throat:  lips, mucosa, and tongue normal; teeth and gums normal Neck: no adenopathy, no carotid bruit, supple, symmetrical, trachea midline and thyroid not enlarged, symmetric, no tenderness/mass/nodules Back: symmetric, no curvature. ROM normal. No CVA tenderness. Lungs: clear to auscultation bilaterally Heart: regular rate and rhythm, S1, S2 normal, no murmur, click, rub or gallop Abdomen: soft, non-tender; bowel sounds normal; no masses,  no organomegaly Pulses: 2+ and symmetric Skin: Skin color, texture, turgor normal. No rashes or lesions Lymph nodes: Cervical, supraclavicular, and  axillary nodes normal. Rectal small nonthrombosed external hemorrhoid, rectal exam normal.   Stool brown   ssessment and Plan:  History of colonic polyps Last colonoscopy [id number] by Natalie Coleman.  Diverticulosis and 5 mm sessile polyp.  Could not advance past splenic flexure.   Bleeding hemorrhoid She has a small nonthrombosed external hemorrhoid which is not currently bleeding or causing pain.  continue prn prep H and increase fiber in diet.   Hypertension Well controlled on current regimen. Renal function stable, no changes today.  Lab Results  Component Value Date   CREATININE 0.7 08/15/2012     Osteoporosis, postmenopausal Continue boniva for now.  DEXA ordered but not done in July. Will reassess in July    Updated Medication List Outpatient Encounter Prescriptions as of 02/20/2013  Medication Sig  . aspirin 81 MG tablet Take 81 mg by mouth daily.  . Cholecalciferol (VITAMIN D3) 1000 UNITS CAPS Take by mouth.  Marland Kitchen. GARLIC PO Take 1 capsule by mouth daily.  Marland Kitchen. ibandronate (BONIVA) 150 MG tablet Take 1 tablet (150 mg total) by mouth every 30 (thirty) days.  . Melatonin 5 MG TABS Take 1 tablet by mouth as needed.  . Misc Natural Products (OSTEO BI-FLEX TRIPLE STRENGTH) TABS Take 1 tablet by mouth daily.  . Multiple Vitamins-Minerals (CENTRUM SILVER PO) Take 1 tablet by mouth daily.  Marland Kitchen. NEXIUM 40 MG capsule TAKE ONE CAPSULE BY MOUTH EVERY DAY  . VALERIAN ROOT PO Take 1 capsule by mouth as needed.  . vitamin B-12 (CYANOCOBALAMIN) 1000 MCG tablet Take 1,000 mcg by mouth daily.  . vitamin C (ASCORBIC ACID) 500 MG tablet Take 500 mg by mouth daily.  . TDaP (BOOSTRIX) 5-2.5-18.5 LF-MCG/0.5 injection Inject 0.5 mLs into the muscle once.  . zoster vaccine live, PF, (ZOSTAVAX) [id number] UNT/0.65ML injection Inject 19,400 Units into the skin once.     No orders of the defined types were placed in this encounter.    No Follow-up on file.

## 2013-02-20 NOTE — Progress Notes (Signed)
Pre-visit discussion using our clinic review tool. No additional management support is needed unless otherwise documented below in the visit note.  

## 2013-02-20 NOTE — Patient Instructions (Signed)
You have a tiny external hemorrhoid.  It is not enflamed or thrombosed so it does not require any treatment now.  You can continue to use preparation H as needed for irritation .  Avoid becoming constipated .

## 2013-02-21 DIAGNOSIS — K649 Unspecified hemorrhoids: Secondary | ICD-10-CM | POA: Insufficient documentation

## 2013-02-21 NOTE — Assessment & Plan Note (Signed)
She has a small nonthrombosed external hemorrhoid which is not currently bleeding or causing pain.  continue prn prep H and increase fiber in diet.

## 2013-02-21 NOTE — Assessment & Plan Note (Signed)
Last colonoscopy [id number] by Dimas AguasMartin [place].  Diverticulosis and 5 mm sessile polyp.  Could not advance past splenic flexure.

## 2013-02-21 NOTE — Assessment & Plan Note (Addendum)
Continue boniva for now.  DEXA ordered but not done in July. Will reassess in July

## 2013-02-21 NOTE — Assessment & Plan Note (Signed)
Well controlled on current regimen. Renal function stable, no changes today.  Lab Results  Component Value Date   CREATININE 0.7 08/15/2012

## 2013-03-20 ENCOUNTER — Telehealth: Payer: Self-pay | Admitting: Internal Medicine

## 2013-03-20 NOTE — Telephone Encounter (Signed)
Relevant patient education assigned to patient using Emmi. ° °

## 2013-06-14 ENCOUNTER — Ambulatory Visit (INDEPENDENT_AMBULATORY_CARE_PROVIDER_SITE_OTHER): Payer: Medicare Other | Admitting: Internal Medicine

## 2013-06-14 ENCOUNTER — Telehealth: Payer: Self-pay | Admitting: Internal Medicine

## 2013-06-14 ENCOUNTER — Encounter: Payer: Self-pay | Admitting: Internal Medicine

## 2013-06-14 VITALS — BP 110/78 | HR 79 | Temp 98.6°F | Resp 16 | Wt 111.5 lb

## 2013-06-14 DIAGNOSIS — J209 Acute bronchitis, unspecified: Secondary | ICD-10-CM | POA: Insufficient documentation

## 2013-06-14 MED ORDER — AZITHROMYCIN 500 MG PO TABS: 500.0000 mg | ORAL_TABLET | Freq: Every day | ORAL | Status: DC

## 2013-06-14 MED ORDER — GUAIFENESIN-CODEINE 100-10 MG/5ML PO SYRP: 10.0000 mL | ORAL_SOLUTION | Freq: Every evening | ORAL | Status: DC | PRN

## 2013-06-14 MED ORDER — BENZONATATE 100 MG PO CAPS: 100.0000 mg | ORAL_CAPSULE | Freq: Three times a day (TID) | ORAL | Status: DC | PRN

## 2013-06-14 MED ORDER — PREDNISONE (PAK) 10 MG PO TABS: ORAL_TABLET | ORAL | Status: DC

## 2013-06-14 MED ORDER — CULTURELLE DIGESTIVE HEALTH PO CAPS: 1.0000 | ORAL_CAPSULE | Freq: Every day | ORAL | Status: AC

## 2013-06-14 MED ORDER — METHYLPREDNISOLONE ACETATE 40 MG/ML IJ SUSP
40.0000 mg | Freq: Once | INTRAMUSCULAR | Status: AC
Start: 2013-06-14 — End: 2013-06-14
  Administered 2013-06-14: 40 mg via INTRAMUSCULAR

## 2013-06-14 NOTE — Telephone Encounter (Signed)
Dr. Darrick Huntsmanullo notified. Appt today with Dr. Darrick Huntsmanullo at 10:45 for acute symptoms only. Pt aware.

## 2013-06-14 NOTE — Telephone Encounter (Signed)
Patient Information:  Caller Name: Crosby Oysterievelis  Phone: [phone]  Patient: Cletus GashBittmann, Calyn  Gender: Female  DOB: 03/08/1940  Age: 73 Years  PCP: Duncan Dullullo, Teresa (Adults only)  Office Follow Up:  Does the office need to follow up with this patient?: Yes  Instructions For The Office: Patient needs to be seen today.  No appts available.  Offered appt. at another office but she is elderly and is unfamiliar with those offices/towns.  "I am really scared."   Symptoms  Reason For Call & Symptoms: Patient calling with cold and cough x 2 weeks, 4/16.  It has continued to worsen.  The cough is productive, green in color.  Reviewed Health History In EMR: Yes  Reviewed Medications In EMR: Yes  Reviewed Allergies In EMR: Yes  Reviewed Surgeries / Procedures: Yes  Date of Onset of Symptoms: 05/31/2013  Guideline(s) Used:  Cough  Disposition Per Guideline:   See Today or Tomorrow in Office  Reason For Disposition Reached:   Continuous (nonstop) coughing interferes with work or school and no improvement using cough treatment per Care Advice  Advice Given:  Reassurance  Coughing is the way that our lungs remove irritants and mucus. It helps protect our lungs from getting pneumonia.  Patient Will Follow Care Advice:  YES

## 2013-06-14 NOTE — Patient Instructions (Signed)
I am prescribing you a prednisone taper, a 7 day course of azithromycin,  And two cough suppressants to use for your bronchitis  Start the azithromycin today Start the prednisone taper tomorrow.   We gave you a shot of prednisone today Take 6 tablets (a whole row) all at the same time tomorrow,  Then 5 tablets the following day, etc until each row is gone Use the cough capsules for daytime cough and add the liquid medicine for nighttime cough   You can use sudafed PE/afrin for nasal congestion or Afrin nasal spray twice daily for 5 days for the ear and sinus congestion   I also recommending using Simply Saline nasal spray twice daily to flush your sinuses out. (available OTC)  Please take a probiotic ( Align, Floraque or Culturelle) for two weeks to prevent a serious antibiotic associated diarrhea  Called clostridium dificile colitis and a vaginal yeast infection .  I sent one to your pharmacy

## 2013-06-14 NOTE — Progress Notes (Signed)
Patient ID: Natalie Coleman, female   DOB: 07/31/1940, 73 y.o.   MRN: [id number]  Patient Active Problem List   Diagnosis Date Noted  . Acute bronchitis 06/14/2013  . Bleeding hemorrhoid 02/21/2013  . Routine general medical examination at a health care facility 08/18/2012  . History of pelvic fracture 09/15/2011  . Hyperlipidemia LDL goal < 130 09/15/2011  . Osteoporosis, postmenopausal 09/15/2011  . History of colonic polyps 09/04/2011  . Screening for breast cancer 09/04/2011  . GERD (gastroesophageal reflux disease)   . Neck pain on left side 09/01/2011    Subjective:  CC:   Chief Complaint  Patient presents with  . Acute Visit    Sore in abdomen from coughing , worse at night  . Cough    productive coughing up green mucus    HPI:   Natalie Coleman is a 73 y.o. female who presents for  Evaluation of Cough, productive for two weeks,  Tried allergy medication,  Did not help,  Then mucinex.  No help,.  Robitussin. Not helping.  No recent travel, no contact with flu   Has been coughing .  Not sneezing ,  Some rhinitis. With occasional blood streaks but  Denies facial pain or ear pain s. Chest feels tight with cough, but not wheezing or short of breath .    Past Medical History  Diagnosis Date  . GERD (gastroesophageal reflux disease)     Past Surgical History  Procedure Laterality Date  . Fracture surgery      right tibia  . Cholecystectomy  2008  . Orif tibia & fibula fractures  March 2006  . Orif ankle fracture bimalleolar  March 2006       The following portions of the patient's history were reviewed and updated as appropriate: Allergies, current medications, and problem list.    Review of Systems:   Patient denies headache, fevers, malaise, unintentional weight loss, skin rash, eye pain, sinus congestion and sinus pain, sore throat, dysphagia,  hemoptysis , cough, dyspnea, wheezing, chest pain, palpitations, orthopnea, edema, abdominal pain, nausea,  melena, diarrhea, constipation, flank pain, dysuria, hematuria, urinary  Frequency, nocturia, numbness, tingling, seizures,  Focal weakness, Loss of consciousness,  Tremor, insomnia, depression, anxiety, and suicidal ideation.     History   Social History  . Marital Status: Divorced    Spouse Name: N/A    Number of Children: N/A  . Years of Education: N/A   Occupational History  . Not on file.   Social History Main Topics  . Smoking status: Never Smoker   . Smokeless tobacco: Not on file  . Alcohol Use: Not on file  . Drug Use: Not on file  . Sexual Activity: Not on file   Other Topics Concern  . Not on file   Social History Narrative  . No narrative on file    Objective:  Filed Vitals:   06/14/13 1054  BP: 110/78  Pulse: 79  Temp: 98.6 F (37 C)  Resp: 16     General appearance: alert, cooperative and appears stated age Ears: normal TM's and external ear canals both ears Throat: lips, mucosa, and tongue normal; teeth and gums normal Neck: no adenopathy, no carotid bruit, supple, symmetrical, trachea midline and thyroid not enlarged, symmetric, no tenderness/mass/nodules Back: symmetric, no curvature. ROM normal. No CVA tenderness. Lungs: clear to auscultation bilaterally Heart: regular rate and rhythm, S1, S2 normal, no murmur, click, rub or gallop Abdomen: soft, non-tender; bowel sounds normal; no masses,  no organomegaly Pulses: 2+ and symmetric Skin: Skin color, texture, turgor normal. No rashes or lesions Lymph nodes: Cervical, supraclavicular, and axillary nodes normal.  Assessment and Plan:  Acute bronchitis Azithromycin,  Steroid taper,  tesssalon for daytime cough and cheratussin for nighttime cough  . Patient was given an IM dose of Depo medrol and advised to start steroid taper tomorrow morning   Updated Medication List Outpatient Encounter Prescriptions as of 06/14/2013  Medication Sig  . aspirin 81 MG tablet Take 81 mg by mouth daily.  .  Cholecalciferol (VITAMIN D3) 1000 UNITS CAPS Take by mouth.  Marland Kitchen. GARLIC PO Take 1 capsule by mouth daily.  Marland Kitchen. ibandronate (BONIVA) 150 MG tablet Take 1 tablet (150 mg total) by mouth every 30 (thirty) days.  . Melatonin 5 MG TABS Take 1 tablet by mouth as needed.  . Misc Natural Products (OSTEO BI-FLEX TRIPLE STRENGTH) TABS Take 1 tablet by mouth daily.  . Multiple Vitamins-Minerals (CENTRUM SILVER PO) Take 1 tablet by mouth daily.  . TDaP (BOOSTRIX) 5-2.5-18.5 LF-MCG/0.5 injection Inject 0.5 mLs into the muscle once.  Marland Kitchen. VALERIAN ROOT PO Take 1 capsule by mouth as needed.  . vitamin B-12 (CYANOCOBALAMIN) 1000 MCG tablet Take 1,000 mcg by mouth daily.  . vitamin C (ASCORBIC ACID) 500 MG tablet Take 500 mg by mouth daily.  Marland Kitchen. zoster vaccine live, PF, (ZOSTAVAX) [id number] UNT/0.65ML injection Inject 19,400 Units into the skin once.  Marland Kitchen. azithromycin (ZITHROMAX) 500 MG tablet Take 1 tablet (500 mg total) by mouth daily.  . benzonatate (TESSALON) 100 MG capsule Take 1 capsule (100 mg total) by mouth 3 (three) times daily as needed for cough.  Marland Kitchen. guaiFENesin-codeine (ROBITUSSIN AC) 100-10 MG/5ML syrup Take 10 mLs by mouth at bedtime as needed for cough.  . Lactobacillus-Inulin (CULTURELLE DIGESTIVE HEALTH) CAPS Take 1 capsule by mouth daily.  Marland Kitchen. NEXIUM 40 MG capsule TAKE ONE CAPSULE BY MOUTH EVERY DAY  . predniSONE (STERAPRED UNI-PAK) 10 MG tablet 6 tablets on Day 1 , then reduce by 1 tablet daily until gone (start on May 1)  . [EXPIRED] methylPREDNISolone acetate (DEPO-MEDROL) injection 40 mg      No orders of the defined types were placed in this encounter.    No Follow-up on file.

## 2013-06-14 NOTE — Progress Notes (Signed)
Pre-visit discussion using our clinic review tool. No additional management support is needed unless otherwise documented below in the visit note.  

## 2013-06-15 ENCOUNTER — Ambulatory Visit: Payer: Medicare Other | Admitting: Adult Health

## 2013-06-16 NOTE — Assessment & Plan Note (Addendum)
Azithromycin,  Steroid taper,  tesssalon for daytime cough and cheratussin for nighttime cough  . Patient was given an IM dose of Depo medrol and advised to start steroid taper tomorrow morning

## 2013-07-05 ENCOUNTER — Telehealth: Payer: Self-pay | Admitting: Internal Medicine

## 2013-07-05 NOTE — Telephone Encounter (Signed)
Made patient an appointment for 07/18/13 patient C/O still not feeling well from visit on 06/14/13 patient stated the ABX was to strong and her small body cannot take that. Tried to schedule patient sooner but patient no not a good time I am going out of town . Advised patient the medication was not affecting her at this time highly unlikely patient still insisted it is medication. FYI Symptoms nervous, anxious, general malaise. FYI

## 2013-07-18 ENCOUNTER — Encounter: Payer: Self-pay | Admitting: *Deleted

## 2013-07-18 ENCOUNTER — Ambulatory Visit (INDEPENDENT_AMBULATORY_CARE_PROVIDER_SITE_OTHER): Payer: Medicare Other | Admitting: Internal Medicine

## 2013-07-18 ENCOUNTER — Encounter: Payer: Self-pay | Admitting: Internal Medicine

## 2013-07-18 VITALS — BP 112/78 | HR 65 | Temp 97.9°F | Resp 16 | Ht <= 58 in | Wt 110.0 lb

## 2013-07-18 DIAGNOSIS — E559 Vitamin D deficiency, unspecified: Secondary | ICD-10-CM

## 2013-07-18 DIAGNOSIS — J209 Acute bronchitis, unspecified: Secondary | ICD-10-CM

## 2013-07-18 DIAGNOSIS — Z86010 Personal history of colonic polyps: Secondary | ICD-10-CM

## 2013-07-18 DIAGNOSIS — M810 Age-related osteoporosis without current pathological fracture: Secondary | ICD-10-CM

## 2013-07-18 DIAGNOSIS — E785 Hyperlipidemia, unspecified: Secondary | ICD-10-CM

## 2013-07-18 DIAGNOSIS — T50905A Adverse effect of unspecified drugs, medicaments and biological substances, initial encounter: Secondary | ICD-10-CM

## 2013-07-18 DIAGNOSIS — T887XXA Unspecified adverse effect of drug or medicament, initial encounter: Secondary | ICD-10-CM

## 2013-07-18 DIAGNOSIS — R5383 Other fatigue: Secondary | ICD-10-CM

## 2013-07-18 DIAGNOSIS — Z1211 Encounter for screening for malignant neoplasm of colon: Secondary | ICD-10-CM

## 2013-07-18 DIAGNOSIS — K649 Unspecified hemorrhoids: Secondary | ICD-10-CM

## 2013-07-18 DIAGNOSIS — R5381 Other malaise: Secondary | ICD-10-CM

## 2013-07-18 MED ORDER — ZOSTER VACCINE LIVE 19400 UNT/0.65ML ~~LOC~~ SOLR: 0.6500 mL | Freq: Once | SUBCUTANEOUS | Status: DC

## 2013-07-18 NOTE — Patient Instructions (Signed)
Your referral to Dr Lemar Livings is in process for your colonoscopy  Your Bone Density test will be scheduled at [facility]  Your shingles vaccine will cost you less $$$ at your pharmacy  Return for fasting lipids prior to your next annual physical in July   I have added dexamethasone to your list of drug intolerances

## 2013-07-18 NOTE — Progress Notes (Signed)
Patient ID: Natalie Coleman, female   DOB: 02/14/1941, 73 y.o.   MRN: [id number]   Patient Active Problem List   Diagnosis Date Noted  . Adverse drug effect 07/21/2013  . Acute bronchitis 06/14/2013  . Bleeding hemorrhoid 02/21/2013  . Routine general medical examination at a health care facility 08/18/2012  . History of pelvic fracture 09/15/2011  . Hyperlipidemia LDL goal < 130 09/15/2011  . Osteoporosis, postmenopausal 09/15/2011  . History of colonic polyps 09/04/2011  . Screening for breast cancer 09/04/2011  . GERD (gastroesophageal reflux disease)   . Neck pain on left side 09/01/2011    Subjective:  CC:   Chief Complaint  Patient presents with  . Follow-up    bronchitis    HPI:   Natalie Flamingievelis C Mccullum is a 73 y.o. female who presents for Follow up on bronchitis treated  Last month with azithromycin, steroid injection/prednisone taper and tessalon perles for cough.   Did not complete abx therapy due to increased jitteriness and malaise  which she now attributes to the dexamethasone injection she was given in the office.  States that the medication madew her confused and jittery. Symptoms have resolved after several weeks.   Still bothered by occasional mucoid discharge from bowels,  Feels like she needs to move her bowels but only small amounts,  .  Taking preparation H for hemorrhoids. Doesn't like fiber supplements,  Has tried them before .  Prefers to add Bran Buds to her cereal,.   Wants the shingles vaccine but concerned it will give her the shingles.    Past Medical History  Diagnosis Date  . GERD (gastroesophageal reflux disease)     Past Surgical History  Procedure Laterality Date  . Fracture surgery      right tibia  . Cholecystectomy  2008  . Orif tibia & fibula fractures  March 2006  . Orif ankle fracture bimalleolar  March 2006       The following portions of the patient's history were reviewed and updated as appropriate: Allergies, current  medications, and problem list.    Review of Systems:   Patient denies headache, fevers, malaise, unintentional weight loss, skin rash, eye pain, sinus congestion and sinus pain, sore throat, dysphagia,  hemoptysis , cough, dyspnea, wheezing, chest pain, palpitations, orthopnea, edema, abdominal pain, nausea, melena, diarrhea, constipation, flank pain, dysuria, hematuria, urinary  Frequency, nocturia, numbness, tingling, seizures,  Focal weakness, Loss of consciousness,  Tremor, insomnia, depression, anxiety, and suicidal ideation.     History   Social History  . Marital Status: Divorced    Spouse Name: N/A    Number of Children: N/A  . Years of Education: N/A   Occupational History  . Not on file.   Social History Main Topics  . Smoking status: Never Smoker   . Smokeless tobacco: Not on file  . Alcohol Use: Not on file  . Drug Use: Not on file  . Sexual Activity: Not on file   Other Topics Concern  . Not on file   Social History Narrative  . No narrative on file    Objective:  Filed Vitals:   07/18/13 1130  BP: 112/78  Pulse: 65  Temp: 97.9 F (36.6 C)  Resp: 16     General appearance: alert, cooperative and appears stated age Ears: normal TM's and external ear canals both ears Throat: lips, mucosa, and tongue normal; teeth and gums normal Neck: no adenopathy, no carotid bruit, supple, symmetrical, trachea midline and thyroid not enlarged,  symmetric, no tenderness/mass/nodules Back: symmetric, no curvature. ROM normal. No CVA tenderness. Lungs: clear to auscultation bilaterally Heart: regular rate and rhythm, S1, S2 normal, no murmur, click, rub or gallop Abdomen: soft, non-tender; bowel sounds normal; no masses,  no organomegaly Pulses: 2+ and symmetric Skin: Skin color, texture, turgor normal. No rashes or lesions Lymph nodes: Cervical, supraclavicular, and axillary nodes normal.  Assessment and Plan:  Adverse drug effect Spent 15 of a 25 minute visit  discussing her reaction to dexamethasone .  Reassurance provided .    Acute bronchitis symptoms resolved without antibiotic course and were likely viral in etiology  Bleeding hemorrhoid Referral to Dr Lemar Livings for colonoscopy and management of hemorrhoids.   History of colonic polyps Last colonoscopy 2009 by Dimas Aguas.  Diverticulosis and 5 mm sessile polyp. He was unable to advance past splenic flexure. Referral to Dr Lemar Livings for follow up colonoscopy     Hyperlipidemia LDL goal < 130 LDL and triglycerides have been acceptable on current diet, she is exercising regularly and liver enzymes are normal.Repeat prior to her annual exam in July     Updated Medication List Outpatient Encounter Prescriptions as of 07/18/2013  Medication Sig  . aspirin 81 MG tablet Take 81 mg by mouth daily.  . Biotin 5000 MCG CAPS Take 1 capsule by mouth daily.  . calcium carbonate 200 MG capsule Take 250 mg by mouth 2 (two) times daily with a meal.  . Cholecalciferol (VITAMIN D3) 1000 UNITS CAPS Take 2,000 Units by mouth daily.   Marland Kitchen GARLIC PO Take 1 capsule by mouth daily.  Marland Kitchen ibandronate (BONIVA) 150 MG tablet Take 1 tablet (150 mg total) by mouth every 30 (thirty) days.  . Lactobacillus-Inulin (CULTURELLE DIGESTIVE HEALTH) CAPS Take 1 capsule by mouth daily.  . Magnesium 250 MG TABS Take 1 tablet by mouth daily.  . Melatonin 5 MG TABS Take 1 tablet by mouth as needed.  . Misc Natural Products (OSTEO BI-FLEX TRIPLE STRENGTH) TABS Take 1 tablet by mouth daily.  . Multiple Vitamins-Minerals (CENTRUM SILVER PO) Take 1 tablet by mouth daily.  Marland Kitchen VALERIAN ROOT PO Take 1 capsule by mouth as needed.  . vitamin B-12 (CYANOCOBALAMIN) 1000 MCG tablet Take 5,000 mcg by mouth daily.   . vitamin C (ASCORBIC ACID) 500 MG tablet Take 500 mg by mouth daily.  Marland Kitchen zinc gluconate 50 MG tablet Take 50 mg by mouth daily.  Marland Kitchen azithromycin (ZITHROMAX) 500 MG tablet Take 1 tablet (500 mg total) by mouth daily.  . benzonatate  (TESSALON) 100 MG capsule Take 1 capsule (100 mg total) by mouth 3 (three) times daily as needed for cough.  Marland Kitchen guaiFENesin-codeine (ROBITUSSIN AC) 100-10 MG/5ML syrup Take 10 mLs by mouth at bedtime as needed for cough.  Marland Kitchen NEXIUM 40 MG capsule TAKE ONE CAPSULE BY MOUTH EVERY DAY  . predniSONE (STERAPRED UNI-PAK) 10 MG tablet 6 tablets on Day 1 , then reduce by 1 tablet daily until gone (start on May 1)  . TDaP (BOOSTRIX) 5-2.5-18.5 LF-MCG/0.5 injection Inject 0.5 mLs into the muscle once.  . zoster vaccine live, PF, (ZOSTAVAX) 37943 UNT/0.65ML injection Inject 19,400 Units into the skin once.  . zoster vaccine live, PF, (ZOSTAVAX) 27614 UNT/0.65ML injection Inject 19,400 Units into the skin once.     Orders Placed This Encounter  Procedures  . DG Bone Density  . Ambulatory referral to General Surgery    No Follow-up on file.

## 2013-07-18 NOTE — Progress Notes (Signed)
Pre-visit discussion using our clinic review tool. No additional management support is needed unless otherwise documented below in the visit note.  

## 2013-07-21 DIAGNOSIS — T50905A Adverse effect of unspecified drugs, medicaments and biological substances, initial encounter: Secondary | ICD-10-CM | POA: Insufficient documentation

## 2013-07-21 NOTE — Assessment & Plan Note (Signed)
symptoms resolved without antibiotic course and were likely viral in etiology

## 2013-07-21 NOTE — Assessment & Plan Note (Signed)
Last colonoscopy 2009 by Dimas Aguas.  Diverticulosis and 5 mm sessile polyp. He was unable to advance past splenic flexure. Referral to Dr Lemar Livings for follow up colonoscopy

## 2013-07-21 NOTE — Assessment & Plan Note (Signed)
Referral to Dr Lemar Livings for colonoscopy and management of hemorrhoids.

## 2013-07-21 NOTE — Assessment & Plan Note (Signed)
LDL and triglycerides have been acceptable on current diet, she is exercising regularly and liver enzymes are normal.Repeat prior to her annual exam in July

## 2013-07-21 NOTE — Assessment & Plan Note (Signed)
Spent 15 of a 25 minute visit discussing her reaction to dexamethasone .  Reassurance provided .

## 2013-08-14 ENCOUNTER — Ambulatory Visit (INDEPENDENT_AMBULATORY_CARE_PROVIDER_SITE_OTHER): Payer: Medicare Other | Admitting: General Surgery

## 2013-08-14 ENCOUNTER — Encounter: Payer: Self-pay | Admitting: General Surgery

## 2013-08-14 VITALS — BP 110/80 | HR 76 | Resp 12 | Ht <= 58 in | Wt 109.0 lb

## 2013-08-14 DIAGNOSIS — Z1211 Encounter for screening for malignant neoplasm of colon: Secondary | ICD-10-CM

## 2013-08-14 NOTE — Patient Instructions (Signed)
Colonoscopy  A colonoscopy is an exam to look at the entire large intestine (colon). This exam can help find problems such as tumors, polyps, inflammation, and areas of bleeding. The exam takes about 1 hour.   LET YOUR HEALTH CARE Blasa Raisch KNOW ABOUT:   · Any allergies you have.  · All medicines you are taking, including vitamins, herbs, eye drops, creams, and over-the-counter medicines.  · Previous problems you or members of your family have had with the use of anesthetics.  · Any blood disorders you have.  · Previous surgeries you have had.  · Medical conditions you have.  RISKS AND COMPLICATIONS   Generally, this is a safe procedure. However, as with any procedure, complications can occur. Possible complications include:  · Bleeding.  · Tearing or rupture of the colon wall.  · Reaction to medicines given during the exam.  · Infection (rare).  BEFORE THE PROCEDURE   · Ask your health care Gerica Koble about changing or stopping your regular medicines.  · You may be prescribed an oral bowel prep. This involves drinking a large amount of medicated liquid, starting the day before your procedure. The liquid will cause you to have multiple loose stools until your stool is almost clear or light green. This cleans out your colon in preparation for the procedure.  · Do not eat or drink anything else once you have started the bowel prep, unless your health care Laiden Milles tells you it is safe to do so.  · Arrange for someone to drive you home after the procedure.  PROCEDURE   · You will be given medicine to help you relax (sedative).  · You will lie on your side with your knees bent.  · A long, flexible tube with a light and camera on the end (colonoscope) will be inserted through the rectum and into the colon. The camera sends video back to a computer screen as it moves through the colon. The colonoscope also releases carbon dioxide gas to inflate the colon. This helps your health care Nannette Zill see the area better.  · During  the exam, your health care Karigan Cloninger may take a small tissue sample (biopsy) to be examined under a microscope if any abnormalities are found.  · The exam is finished when the entire colon has been viewed.  AFTER THE PROCEDURE   · Do not drive for 24 hours after the exam.  · You may have a small amount of blood in your stool.  · You may pass moderate amounts of gas and have mild abdominal cramping or bloating. This is caused by the gas used to inflate your colon during the exam.  · Ask when your test results will be ready and how you will get your results. Make sure you get your test results.  Document Released: 01/30/2000 Document Revised: 11/22/2012 Document Reviewed: 10/09/2012  Exit[employer]® Patient Information ©2015 [employer]. This information is not intended to replace advice given to you by your health care Khrystal Jeanmarie. Make sure you discuss any questions you have with your health care Brayley Mackowiak.

## 2013-08-14 NOTE — Progress Notes (Signed)
Patient ID: Natalie Coleman, female   DOB: 08/01/1940, 73 y.o.   MRN: [id number]  Chief Complaint  Patient presents with  . Other    colonoscopy    HPI Natalie Coleman is a 73 y.o. female here today for an evaluation of a screening colonoscopy . Last colonoscopy completed in [place] was done in 2009 by Barnetta ChapelMartin Skulskie, M.D..  She reported that a followup study had been completed in [place], although she had no immediate recollection of when, where or by who. Review of the available he MR records showed no additional studies since 2009.  After 2009 study, completed for a history of polyps, a solitary tubular adenoma was identified in the distal sigmoid colon. Diverticuli were also appreciated.  The patient denies any bowel problems at this time.   HPI  Past Medical History  Diagnosis Date  . GERD (gastroesophageal reflux disease)   . Colon polyps   . History of blood transfusion   . Hemorrhoids     Past Surgical History  Procedure Laterality Date  . Fracture surgery      right tibia  . Cholecystectomy  2008  . Orif tibia & fibula fractures  March 2006  . Orif ankle fracture bimalleolar  March 2006  . Colonoscopy  2009  . Eye surgery Right   . Cesarean section  [id number]    Family History  Problem Relation Age of Onset  . Hypertension Father   . Cancer Neg Hx   . Hypertension Mother     Social History History  Substance Use Topics  . Smoking status: Never Smoker   . Smokeless tobacco: Never Used  . Alcohol Use: Yes    Allergies  Allergen Reactions  . Dexamethasone     Excessive jitteriness , confusion     Current Outpatient Prescriptions  Medication Sig Dispense Refill  . aspirin 81 MG tablet Take 81 mg by mouth daily.      . Biotin 5000 MCG CAPS Take 1 capsule by mouth daily.      . calcium carbonate 200 MG capsule Take 250 mg by mouth 2 (two) times daily with a meal.      . Cholecalciferol (VITAMIN D3) 1000 UNITS CAPS Take 2,000 Units by mouth  daily.       . Coenzyme Q10 100 MG TABS Take 1 tablet by mouth daily.      Marland Kitchen. GARLIC PO Take 1 capsule by mouth daily.      Marland Kitchen. ibandronate (BONIVA) 150 MG tablet Take 1 tablet (150 mg total) by mouth every 30 (thirty) days.  12 tablet  1  . Lactobacillus-Inulin (CULTURELLE DIGESTIVE HEALTH) CAPS Take 1 capsule by mouth daily.  14 capsule  0  . Magnesium 250 MG TABS Take 1 tablet by mouth daily.      . Melatonin 5 MG TABS Take 1 tablet by mouth as needed.      . Misc Natural Products (OSTEO BI-FLEX TRIPLE STRENGTH) TABS Take 1 tablet by mouth daily.      . Multiple Vitamins-Minerals (CENTRUM SILVER PO) Take 1 tablet by mouth daily.      Marland Kitchen. OVER THE COUNTER MEDICATION Hair Volume - 1 tab by mouth daily.      Marland Kitchen. VALERIAN ROOT PO Take 1 capsule by mouth as needed.      . vitamin B-12 (CYANOCOBALAMIN) 1000 MCG tablet Take 5,000 mcg by mouth daily.       . vitamin C (ASCORBIC ACID) 500 MG tablet Take 500 mg  by mouth daily.      Marland Kitchen. zinc gluconate 50 MG tablet Take 50 mg by mouth daily.       No current facility-administered medications for this visit.    Review of Systems Review of Systems  Constitutional: Negative.   Respiratory: Negative.   Cardiovascular: Negative.   Gastrointestinal: Negative.     Blood pressure 110/80, pulse 76, resp. rate 12, height 4\' 8"  (1.422 m), weight 109 lb (49.442 kg).  Physical Exam Physical Exam  Constitutional: She is oriented to person, place, and time. She appears well-developed and well-nourished.  Neck: Neck supple. No thyromegaly present.  Cardiovascular: Normal rate, regular rhythm and normal heart sounds.   No murmur heard. Pulmonary/Chest: Effort normal and breath sounds normal.  Lymphadenopathy:    She has no cervical adenopathy.  Neurological: She is alert and oriented to person, place, and time.  Skin: Skin is warm.    Data Reviewed 2009 endoscopy report and pathology.  Assessment    Possible candidate for screening colonoscopy.     Plan    The patient reports that she has records of her Brandon experienced at home, and will bring the contact information. We'll send a fax request to the [place] facility, and if she has had a study within 5 years, unless a high-grade dysplastic lesion was identified, she may be able to postpone this study.  The patient reports she has significant nausea vomiting with the prep. She also reports that she was "crazy" after sedation.  When she is a candidate for endoscopy preprocedure Reglan may be of benefit to minimize nausea.     PCP: Staci Acostaullo, Teresa    Byrnett, Jeffrey W 08/15/2013, 7:02 AM

## 2013-08-15 ENCOUNTER — Telehealth: Payer: Self-pay | Admitting: General Surgery

## 2013-08-15 DIAGNOSIS — Z1211 Encounter for screening for malignant neoplasm of colon: Secondary | ICD-10-CM | POA: Insufficient documentation

## 2013-08-15 NOTE — Telephone Encounter (Signed)
I LEFT A MESSAGE FOR PATIENT TO CALL BACK & SPEAK TO ME(Natalie Coleman).WHILE LEAVING MESSAGE SHE CALLED ON THE OTHER LINE.

## 2013-08-20 ENCOUNTER — Other Ambulatory Visit: Payer: Self-pay | Admitting: Internal Medicine

## 2013-08-31 ENCOUNTER — Other Ambulatory Visit: Payer: Self-pay

## 2013-08-31 ENCOUNTER — Other Ambulatory Visit: Payer: Self-pay | Admitting: Internal Medicine

## 2013-08-31 DIAGNOSIS — Z1231 Encounter for screening mammogram for malignant neoplasm of breast: Secondary | ICD-10-CM

## 2013-10-02 ENCOUNTER — Other Ambulatory Visit: Payer: Medicare Other

## 2013-10-02 ENCOUNTER — Ambulatory Visit: Payer: Medicare Other

## 2013-10-09 ENCOUNTER — Ambulatory Visit
Admission: RE | Admit: 2013-10-09 | Discharge: 2013-10-09 | Disposition: A | Discharge status: Home / Self Care | Payer: Medicare Other | Source: Ambulatory Visit

## 2013-10-09 ENCOUNTER — Ambulatory Visit
Admission: RE | Admit: 2013-10-09 | Discharge: 2013-10-09 | Disposition: A | Discharge status: Home / Self Care | Payer: Medicare Other | Source: Ambulatory Visit | Attending: Internal Medicine | Admitting: Internal Medicine

## 2013-10-09 DIAGNOSIS — M810 Age-related osteoporosis without current pathological fracture: Secondary | ICD-10-CM

## 2013-10-09 DIAGNOSIS — Z1231 Encounter for screening mammogram for malignant neoplasm of breast: Secondary | ICD-10-CM

## 2013-10-17 ENCOUNTER — Telehealth: Payer: Self-pay | Admitting: Internal Medicine

## 2013-10-17 DIAGNOSIS — M810 Age-related osteoporosis without current pathological fracture: Secondary | ICD-10-CM

## 2013-10-17 LAB — HM DEXA SCAN

## 2013-12-17 ENCOUNTER — Encounter: Payer: Self-pay | Admitting: General Surgery

## 2013-12-18 ENCOUNTER — Telehealth: Payer: Self-pay | Admitting: Internal Medicine

## 2013-12-18 NOTE — Telephone Encounter (Signed)
Pt scheduled tomorrow at 2:30pm. 

## 2013-12-18 NOTE — Telephone Encounter (Signed)
Patient Information:  Caller Name: Crosby Oysterievelis  Phone: [phone]  Patient: Natalie Coleman, Natalie Coleman  Gender: Female  DOB: 05/24/1940  Age: 73 Years  PCP: Duncan Dullullo, Teresa (Adults only)  Office Follow Up:  Does the office need to follow up with this patient?: Yes  Instructions For The Office: Please call for an appointment. Pt declines an appointment at another office as she's 73 and gets lost easily in [place].  RN Note:  Pt reports a cough that began last PM. Her face seems swollen to her. A friend told her she needed to be seen for her cough. Otherwise she wouldn't have called.  Symptoms  Reason For Call & Symptoms: Cough  Reviewed Health History In EMR: Yes  Reviewed Medications In EMR: Yes  Reviewed Allergies In EMR: Yes  Reviewed Surgeries / Procedures: Yes  Date of Onset of Symptoms: 12/17/2013  Guideline(s) Used:  Cough  Disposition Per Guideline:   See Today or Tomorrow in Office  Reason For Disposition Reached:   Patient wants to be seen  Advice Given:  Sip on warm fluids, run a humidifier. May take Robitussin [facility] as prescribed at an OV on 06/14/13.   Patient Will Follow Care Advice:  YES

## 2013-12-19 ENCOUNTER — Ambulatory Visit (INDEPENDENT_AMBULATORY_CARE_PROVIDER_SITE_OTHER): Payer: Medicare Other | Admitting: Internal Medicine

## 2013-12-19 ENCOUNTER — Encounter: Payer: Self-pay | Admitting: Internal Medicine

## 2013-12-19 VITALS — BP 112/74 | HR 75 | Temp 98.4°F | Resp 16 | Ht <= 58 in | Wt 109.8 lb

## 2013-12-19 DIAGNOSIS — J069 Acute upper respiratory infection, unspecified: Secondary | ICD-10-CM

## 2013-12-19 NOTE — Patient Instructions (Signed)
You have a viral syndrome which is causing your cough .    The post nasal drip may alsoe be contributing to  your  Cough.  Iadvise use of the following OTC meds to help with your other symptoms.   Take generic OTC benadryl 25 mg  At bedtime for the drainage,,   flush your sinuses twice daily with Simply Saline    Use Sudafed PE  10 mg every 8 hours if you develop sinus pain or congestion  Avoid crowds and open food parties during the flu season  When you are feeling better ,  Consider volunteering at the hospital

## 2013-12-19 NOTE — Progress Notes (Signed)
Pre-visit discussion using our clinic review tool. No additional management support is needed unless otherwise documented below in the visit note.  

## 2013-12-19 NOTE — Progress Notes (Signed)
Patient ID: Patient Active Problem List   Diagnosis Date Noted  . Upper respiratory infection, viral 12/22/2013  . Encounter for screening colonoscopy 08/15/2013  . Adverse drug effect 07/21/2013  . Bleeding hemorrhoid 02/21/2013  . Routine general medical examination at a health care facility 08/18/2012  . History of pelvic fracture 09/15/2011  . Hyperlipidemia LDL goal < 130 09/15/2011  . Osteoporosis, postmenopausal 09/15/2011  . History of colonic polyps 09/04/2011  . Screening for breast cancer 09/04/2011  . GERD (gastroesophageal reflux disease)   . Neck pain on left side 09/01/2011    Subjective:  CC:   Chief Complaint  Patient presents with  . Cough    facial swelling stated by patient, afebrile,     HPI:   Natalie Coleman is a 73 y.o. female who presents for    2 DAYS OF COUGH,  STARTED WITH A SORE THROAT   NECK NODES ON LEFT WERE TENDERE AND THROAT ITCH ON LEFT .  NO FEVERS,  COUGH HAS IMPROVED   Past Medical History  Diagnosis Date  . GERD (gastroesophageal reflux disease)   . Colon polyps   . History of blood transfusion   . Hemorrhoids     Past Surgical History  Procedure Laterality Date  . Fracture surgery      right tibia  . Cholecystectomy  2008  . Orif tibia & fibula fractures  March 2006  . Orif ankle fracture bimalleolar  March 2006  . Colonoscopy  2009  . Eye surgery Right   . Cesarean section  [id number]       The following portions of the patient's history were reviewed and updated as appropriate: Allergies, current medications, and problem list.    Review of Systems:   Patient denies headache, fevers, malaise, unintentional weight loss, skin rash, eye pain, sinus congestion and sinus pain, sore throat, dysphagia,  hemoptysis , cough, dyspnea, wheezing, chest pain, palpitations, orthopnea, edema, abdominal pain, nausea, melena, diarrhea, constipation, flank pain, dysuria, hematuria, urinary  Frequency, nocturia, numbness,  tingling, seizures,  Focal weakness, Loss of consciousness,  Tremor, insomnia, depression, anxiety, and suicidal ideation.     History   Social History  . Marital Status: Divorced    Spouse Name: N/A    Number of Children: N/A  . Years of Education: N/A   Occupational History  . Not on file.   Social History Main Topics  . Smoking status: Never Smoker   . Smokeless tobacco: Never Used  . Alcohol Use: Yes  . Drug Use: No  . Sexual Activity: Not on file   Other Topics Concern  . Not on file   Social History Narrative    Objective:  Filed Vitals:   12/19/13 1435  BP: 112/74  Pulse: 75  Temp: 98.4 F (36.9 C)  Resp: 16     General appearance: alert, cooperative and appears stated age Ears: normal TM's and external ear canals both ears Throat: lips, mucosa, and tongue normal; teeth and gums normal Neck: no adenopathy, no carotid bruit, supple, symmetrical, trachea midline and thyroid not enlarged, symmetric, no tenderness/mass/nodules Back: symmetric, no curvature. ROM normal. No CVA tenderness. Lungs: clear to auscultation bilaterally Heart: regular rate and rhythm, S1, S2 normal, no murmur, click, rub or gallop Abdomen: soft, non-tender; bowel sounds normal; no masses,  no organomegaly Pulses: 2+ and symmetric Skin: Skin color, texture, turgor normal. No rashes or lesions Lymph nodes: Cervical, supraclavicular, and axillary nodes normal.  Assessment and Plan:  Upper respiratory infection,  viral I do not routine call in antibiotics to treat  upper respiratory infections reported by patients via phone for several reasons:  1) generally the symptoms are from a viral infection which will run its course with or without  antibiotics. 2) ue of antibiotics increases a patient's risk of a very serious antibiotic associated colitis called "C . dificile " which often requires hospitalization to eradicate. If patient is unwilling to make appt and feels they  need assistance in  treating current symptoms, recommend the following:   Lavage your sinuses twice daily with Simply saline nasal spray.  Use generic benadryl 25 mg every 8 hours for the post nasal drip Sudafed PE 10 to 30 mg every 6 hours for sinus and Eustachian tube congestion If the sudafed is not tolerated,  Use  Afrin nasal spray every 12 hours as needed for the congestion Not more than 5 days.   Gargle with salt water as needed for the sore throat.  Delsym is available OTC as a cough syrup      Updated Medication List Outpatient Encounter Prescriptions as of 12/19/2013  Medication Sig  . aspirin 81 MG tablet Take 81 mg by mouth daily.  . Biotin 5000 MCG CAPS Take 1 capsule by mouth daily.  . calcium carbonate 200 MG capsule Take 250 mg by mouth 2 (two) times daily with a meal.  . Cholecalciferol (VITAMIN D3) 1000 UNITS CAPS Take 2,000 Units by mouth daily.   . Coenzyme Q10 100 MG TABS Take 1 tablet by mouth daily.  Marland Kitchen. GARLIC PO Take 1 capsule by mouth daily.  Marland Kitchen. ibandronate (BONIVA) 150 MG tablet TAKE 1 TABLET BY MOUTH EVERY 30 DAYS  . Lactobacillus-Inulin (CULTURELLE DIGESTIVE HEALTH) CAPS Take 1 capsule by mouth daily.  . Magnesium 250 MG TABS Take 1 tablet by mouth daily.  . Melatonin 5 MG TABS Take 1 tablet by mouth as needed.  . Misc Natural Products (OSTEO BI-FLEX TRIPLE STRENGTH) TABS Take 1 tablet by mouth daily.  . Multiple Vitamins-Minerals (CENTRUM SILVER PO) Take 1 tablet by mouth daily.  Marland Kitchen. OVER THE COUNTER MEDICATION Hair Volume - 1 tab by mouth daily.  Marland Kitchen. VALERIAN ROOT PO Take 1 capsule by mouth as needed.  . vitamin B-12 (CYANOCOBALAMIN) 1000 MCG tablet Take 5,000 mcg by mouth daily.   . vitamin C (ASCORBIC ACID) 500 MG tablet Take 500 mg by mouth daily.  Marland Kitchen. zinc gluconate 50 MG tablet Take 50 mg by mouth daily.     No orders of the defined types were placed in this encounter.    No Follow-up on file.       Natalie FlamingNievelis C Falin, female   DOB: 06/07/1940, 73 y.o.   MRN:  [id number].

## 2013-12-22 DIAGNOSIS — J069 Acute upper respiratory infection, unspecified: Secondary | ICD-10-CM | POA: Insufficient documentation

## 2013-12-22 NOTE — Assessment & Plan Note (Signed)
I do not routine call in antibiotics to treat  upper respiratory infections reported by patients via phone for several reasons:  1) generally the symptoms are from a viral infection which will run its course with or without  antibiotics. 2) ue of antibiotics increases a patient's risk of a very serious antibiotic associated colitis called "C . dificile " which often requires hospitalization to eradicate. If patient is unwilling to make appt and feels they  need assistance in treating current symptoms, recommend the following:   Lavage your sinuses twice daily with Simply saline nasal spray.  Use generic benadryl 25 mg every 8 hours for the post nasal drip Sudafed PE 10 to 30 mg every 6 hours for sinus and Eustachian tube congestion If the sudafed is not tolerated,  Use  Afrin nasal spray every 12 hours as needed for the congestion Not more than 5 days.   Gargle with salt water as needed for the sore throat.  Delsym is available OTC as a cough syrup

## 2013-12-28 ENCOUNTER — Encounter: Payer: Self-pay | Admitting: Internal Medicine

## 2013-12-28 ENCOUNTER — Ambulatory Visit (INDEPENDENT_AMBULATORY_CARE_PROVIDER_SITE_OTHER): Payer: Medicare Other | Admitting: Internal Medicine

## 2013-12-28 VITALS — BP 118/76 | HR 74 | Temp 98.2°F | Resp 14 | Ht <= 58 in | Wt 110.5 lb

## 2013-12-28 DIAGNOSIS — L0889 Other specified local infections of the skin and subcutaneous tissue: Secondary | ICD-10-CM

## 2013-12-28 DIAGNOSIS — R5381 Other malaise: Secondary | ICD-10-CM

## 2013-12-28 DIAGNOSIS — L03314 Cellulitis of groin: Secondary | ICD-10-CM

## 2013-12-28 DIAGNOSIS — L089 Local infection of the skin and subcutaneous tissue, unspecified: Secondary | ICD-10-CM

## 2013-12-28 DIAGNOSIS — E785 Hyperlipidemia, unspecified: Secondary | ICD-10-CM

## 2013-12-28 DIAGNOSIS — Z23 Encounter for immunization: Secondary | ICD-10-CM

## 2013-12-28 DIAGNOSIS — E559 Vitamin D deficiency, unspecified: Secondary | ICD-10-CM

## 2013-12-28 DIAGNOSIS — M810 Age-related osteoporosis without current pathological fracture: Secondary | ICD-10-CM

## 2013-12-28 DIAGNOSIS — R5383 Other fatigue: Secondary | ICD-10-CM

## 2013-12-28 LAB — CBC WITH DIFFERENTIAL/PLATELET
BASOS ABS: 0 10*3/uL (ref 0.0–0.1)
BASOS PCT: 0.5 % (ref 0.0–3.0)
Eosinophils Absolute: 0.2 10*3/uL (ref 0.0–0.7)
Eosinophils Relative: 2.5 % (ref 0.0–5.0)
HCT: 38.6 % (ref 36.0–46.0)
HEMOGLOBIN: 13 g/dL (ref 12.0–15.0)
LYMPHS PCT: 30.8 % (ref 12.0–46.0)
Lymphs Abs: 2.8 10*3/uL (ref 0.7–4.0)
MCHC: 33.7 g/dL (ref 30.0–36.0)
MCV: 89.9 fl (ref 78.0–100.0)
MONOS PCT: 7.4 % (ref 3.0–12.0)
Monocytes Absolute: 0.7 10*3/uL (ref 0.1–1.0)
NEUTROS ABS: 5.4 10*3/uL (ref 1.4–7.7)
Neutrophils Relative %: 58.8 % (ref 43.0–77.0)
Platelets: 296 10*3/uL (ref 150.0–400.0)
RBC: 4.29 Mil/uL (ref 3.87–5.11)
RDW: 13 % (ref 11.5–15.5)
WBC: 9.1 10*3/uL (ref 4.0–10.5)

## 2013-12-28 LAB — COMPREHENSIVE METABOLIC PANEL
ALBUMIN: 3.8 g/dL (ref 3.5–5.2)
ALT: 32 U/L (ref 0–35)
AST: 27 U/L (ref 0–37)
Alkaline Phosphatase: 59 U/L (ref 39–117)
BUN: 29 mg/dL — ABNORMAL HIGH (ref 6–23)
CALCIUM: 10 mg/dL (ref 8.4–10.5)
CHLORIDE: 105 meq/L (ref 96–112)
CO2: 24 meq/L (ref 19–32)
CREATININE: 0.8 mg/dL (ref 0.4–1.2)
GFR: 77.64 mL/min (ref >60.00)
Glucose, Bld: 94 mg/dL (ref 70–99)
POTASSIUM: 4 meq/L (ref 3.5–5.1)
Sodium: 138 mEq/L (ref 135–145)
Total Bilirubin: 0.5 mg/dL (ref 0.2–1.2)
Total Protein: 7.1 g/dL (ref 6.0–8.3)

## 2013-12-28 LAB — LIPID PANEL
Cholesterol: 211 mg/dL — ABNORMAL HIGH (ref 0–200)
HDL: 41 mg/dL (ref >39.00)
NONHDL: 170
TRIGLYCERIDES: 222 mg/dL — AB (ref 0.0–149.0)
Total CHOL/HDL Ratio: 5
VLDL: 44.4 mg/dL — ABNORMAL HIGH (ref 0.0–40.0)

## 2013-12-28 LAB — LDL CHOLESTEROL, DIRECT: LDL DIRECT: 133.6 mg/dL

## 2013-12-28 MED ORDER — TETANUS-DIPHTH-ACELL PERTUSSIS 5-2.5-18.5 LF-MCG/0.5 IM SUSP: 0.5000 mL | Freq: Once | INTRAMUSCULAR | Status: DC

## 2013-12-28 MED ORDER — CEPHALEXIN 500 MG PO CAPS: 500.0000 mg | ORAL_CAPSULE | Freq: Three times a day (TID) | ORAL | Status: DC

## 2013-12-28 NOTE — Assessment & Plan Note (Addendum)
T scores have been stable for over 5 years with o history of fragility fractures on Boniva.  She does not want to consider change in therapy with  Prolia.

## 2013-12-28 NOTE — Progress Notes (Signed)
Patient ID: Natalie Coleman, female   DOB: 06/28/1940, 73 y.o.   MRN: [id number]  Patient Active Problem List   Diagnosis Date Noted  . Skin pustule 12/30/2013  . Upper respiratory infection, viral 12/22/2013  . Encounter for screening colonoscopy 08/15/2013  . Adverse drug effect 07/21/2013  . Bleeding hemorrhoid 02/21/2013  . Routine general medical examination at a health care facility 08/18/2012  . History of pelvic fracture 09/15/2011  . Hyperlipidemia LDL goal < 130 09/15/2011  . Osteoporosis, postmenopausal 09/15/2011  . History of colonic polyps 09/04/2011  . Screening for breast cancer 09/04/2011  . GERD (gastroesophageal reflux disease)   . Neck pain on left side 09/01/2011    Subjective:  CC:   Chief Complaint  Patient presents with  . Rash    under bilateral arm and in vaginal area.Patient has had rash for 2 month.    HPI:   Natalie Coleman is a 73 y.o. female who presents for  1) Papular skin rash.  Patient reports history of papular rash occurring in both axilla in the recent past, which improved with use of OTC creams and change in soaps but became concerned  When she developed a vesicle on her mons pubis in her pubic area, which she noticed while showering.  the are was not itchy but was irritated and red,  She has not been  sexually active and lives alone .  She recently recovered from a viral URI.   2) Wants to review the bone density test ,  Did not like the new facility that she had it done at recently     Past Medical History  Diagnosis Date  . GERD (gastroesophageal reflux disease)   . Colon polyps   . History of blood transfusion   . Hemorrhoids     Past Surgical History  Procedure Laterality Date  . Fracture surgery      right tibia  . Cholecystectomy  2008  . Orif tibia & fibula fractures  March 2006  . Orif ankle fracture bimalleolar  March 2006  . Colonoscopy  2009  . Eye surgery Right   . Cesarean section  [id number]        The following portions of the patient's history were reviewed and updated as appropriate: Allergies, current medications, and problem list.    Review of Systems:   Patient denies headache, fevers, malaise, unintentional weight loss, skin rash, eye pain, sinus congestion and sinus pain, sore throat, dysphagia,  hemoptysis , cough, dyspnea, wheezing, chest pain, palpitations, orthopnea, edema, abdominal pain, nausea, melena, diarrhea, constipation, flank pain, dysuria, hematuria, urinary  Frequency, nocturia, numbness, tingling, seizures,  Focal weakness, Loss of consciousness,  Tremor, insomnia, depression, anxiety, and suicidal ideation.     History   Social History  . Marital Status: Divorced    Spouse Name: N/A    Number of Children: N/A  . Years of Education: N/A   Occupational History  . Not on file.   Social History Main Topics  . Smoking status: Never Smoker   . Smokeless tobacco: Never Used  . Alcohol Use: Yes  . Drug Use: No  . Sexual Activity: Not on file   Other Topics Concern  . Not on file   Social History Narrative    Objective:  Filed Vitals:   12/28/13 1007  BP: 118/76  Pulse: 74  Temp: 98.2 F (36.8 C)  Resp: 14     General appearance: alert, cooperative and appears stated age Lungs: clear  to auscultation bilaterally Heart: regular rate and rhythm, S1, S2 normal, no murmur, click, rub or gallop Abdomen: soft, non-tender; bowel sounds normal; no masses,  no organomegaly Pulses: 2+ and symmetric Skin:  multiple flesh colored skin tags in both axillary areas. Small group of effaced vesicles on erythmatous base on mons pubis  Lymph:  Cervical, supraclavicular, inguinal and axillary nodes normal.  Assessment and Plan:  Osteoporosis, postmenopausal T scores have been stable for over 5 years with o history of fragility fractures on Boniva.  She does not want to consider change in therapy with  Prolia.   Skin pustule She is overly  concerned about the pubic lesion and has been applying multiple OTC astringents including rubbing alcohol to it.  Will treat for staph infection .  The axillary papules appear to be skin tags.  A total of 30 minutes of face to face time was spent with patient more than half of which was spent in counselling and coordination of care    Updated Medication List Outpatient Encounter Prescriptions as of 12/28/2013  Medication Sig  . aspirin 81 MG tablet Take 81 mg by mouth daily.  . Biotin 5000 MCG CAPS Take 1 capsule by mouth daily.  . calcium carbonate 200 MG capsule Take 250 mg by mouth 2 (two) times daily with a meal.  . Cholecalciferol (VITAMIN D3) 1000 UNITS CAPS Take 2,000 Units by mouth daily.   . Coenzyme Q10 100 MG TABS Take 1 tablet by mouth daily.  Marland Kitchen. GARLIC PO Take 1 capsule by mouth daily.  Marland Kitchen. ibandronate (BONIVA) 150 MG tablet TAKE 1 TABLET BY MOUTH EVERY 30 DAYS  . Lactobacillus-Inulin (CULTURELLE DIGESTIVE HEALTH) CAPS Take 1 capsule by mouth daily.  . Magnesium 250 MG TABS Take 1 tablet by mouth daily.  . Melatonin 5 MG TABS Take 1 tablet by mouth as needed.  . Misc Natural Products (OSTEO BI-FLEX TRIPLE STRENGTH) TABS Take 1 tablet by mouth daily.  . Multiple Vitamins-Minerals (CENTRUM SILVER PO) Take 1 tablet by mouth daily.  Marland Kitchen. OVER THE COUNTER MEDICATION Hair Volume - 1 tab by mouth daily.  Marland Kitchen. VALERIAN ROOT PO Take 1 capsule by mouth as needed.  . vitamin B-12 (CYANOCOBALAMIN) 1000 MCG tablet Take 5,000 mcg by mouth daily.   . vitamin C (ASCORBIC ACID) 500 MG tablet Take 500 mg by mouth daily.  Marland Kitchen. zinc gluconate 50 MG tablet Take 50 mg by mouth daily.  . cephALEXin (KEFLEX) 500 MG capsule Take 1 capsule (500 mg total) by mouth 3 (three) times daily. With food  . Tdap (BOOSTRIX) 5-2.5-18.5 LF-MCG/0.5 injection Inject 0.5 mLs into the muscle once.     Orders Placed This Encounter  Procedures  . Varicella Zoster Abs, IgG/IgM  . CBC with Differential    No Follow-up on  file.

## 2013-12-28 NOTE — Patient Instructions (Addendum)
I am treating you for a bacterial infection that might be causing your rash.  The medication is called Keflex ,  Take it 3 times daily with food  Plesae start using lever 2000 soap on your body  Please eat a serving of yougurt daily to prevent diarrhea   Your still need your tetanus-diptheria-pertussis vaccine (TDaP) but it may not be covered by your insurance,    you can get it for less $$$ at a local pharmacy with the script I have provided you.

## 2013-12-30 DIAGNOSIS — L089 Local infection of the skin and subcutaneous tissue, unspecified: Secondary | ICD-10-CM | POA: Insufficient documentation

## 2013-12-30 NOTE — Assessment & Plan Note (Signed)
She is overly concerned about the pubic lesion and has been applying multiple OTC astringents including rubbing alcohol to it.  Will treat for staph infection .  The axillary papules appear to be skin tags.

## 2013-12-31 LAB — VARICELLA ZOSTER ABS, IGG/IGM: VARICELLA: 638 {index} (ref >165)

## 2014-01-01 ENCOUNTER — Telehealth: Payer: Self-pay | Admitting: Internal Medicine

## 2014-01-02 LAB — VITAMIN D 25 HYDROXY (VIT D DEFICIENCY, FRACTURES): VITD: 49.63 ng/mL (ref 30.00–100.00)

## 2014-01-02 LAB — TSH: TSH: 1.12 u[IU]/mL (ref 0.35–4.50)

## 2014-01-02 NOTE — Telephone Encounter (Signed)
Left patient detailed message per DPR ,

## 2014-01-02 NOTE — Telephone Encounter (Signed)
Your cholesterol, liver and kidney function are normal.  You have had prior exposure to chicken pox and can therefore receive the Shingles vaccine S,   Dr. Darrick Huntsmanullo

## 2014-01-02 NOTE — Telephone Encounter (Signed)
Patient calling for results on labs.

## 2014-01-04 ENCOUNTER — Encounter: Payer: Self-pay | Admitting: *Deleted

## 2014-01-07 ENCOUNTER — Telehealth: Payer: Self-pay | Admitting: Internal Medicine

## 2014-01-07 DIAGNOSIS — R21 Rash and other nonspecific skin eruption: Secondary | ICD-10-CM

## 2014-01-07 NOTE — Telephone Encounter (Signed)
Yes, I have initiated referral to Dr Encarnacion SlatesIsenstein [facility]

## 2014-01-07 NOTE — Telephone Encounter (Signed)
Ms. Natalie Coleman came in very concerned saying the medication she took for her skin irritation has not worked and she's taken it in full. She's wondering if a referral needs to be sent on her behalf to a dermatologist as she discussed with Dr. Darrick Huntsmanullo. Please call the pt. Pt ph# [phone] Thank you.

## 2014-01-08 NOTE — Telephone Encounter (Signed)
Left detailed message on VM of referral

## 2014-01-21 ENCOUNTER — Telehealth: Payer: Self-pay | Admitting: *Deleted

## 2014-01-21 MED ORDER — DENOSUMAB 60 MG/ML ~~LOC~~ SOLN: 60.0000 mg | SUBCUTANEOUS | Status: DC

## 2014-01-21 NOTE — Telephone Encounter (Signed)
Spoke with pt, advised of MDs message.  Pt verbalized understanding 

## 2014-01-21 NOTE — Telephone Encounter (Signed)
Pt came in to clinic requesting an order for Prolia injection.  Please advise

## 2014-01-21 NOTE — Telephone Encounter (Signed)
Please tell her to continue boniva until we can confirm that Prolia will be paid for by her insurance.  prolia uthorization is in process

## 2014-01-22 ENCOUNTER — Encounter: Payer: Self-pay | Admitting: Internal Medicine

## 2014-01-24 ENCOUNTER — Telehealth: Payer: Self-pay | Admitting: Internal Medicine

## 2014-01-24 NOTE — Telephone Encounter (Signed)
I have sent pt's info for Prolia insurance verification and will notify you once I have a response. Thank you. °

## 2014-01-25 NOTE — Telephone Encounter (Signed)
Just FYI.

## 2014-01-31 NOTE — Telephone Encounter (Signed)
Pt left VM, checking on Prolia. Called, advised we were still awaiting approval from her insurance company, that it make take a while before we get a response and that we would contact her once we hear back.  verbalized understanding

## 2014-02-06 NOTE — Telephone Encounter (Signed)
Pt left VM, stating she wanted to proceed with Prolia. Prolia ordered. When I called back, left message that she needs to schedule a nurse visit.

## 2014-02-06 NOTE — Telephone Encounter (Signed)
I have rec'd pt's insurance verification for Prolia.  Pt's estimated responsibility will be a $50 co-pay for Prolia and whether an OV is billed or not there will also be a $10 co-pay to cover the admin and if applicable the OV.  This means pt's total estimated responsibility will be $60.  Please make pt aware this an estimate and we will not know an exact amt until her insurance pays. I have sent a copy of the summary of benefits to be scanned into pt's chart. If you have any questions, please let me know. Thank you.

## 2014-02-06 NOTE — Telephone Encounter (Signed)
Left message, notifying patient. Advised to call office if she wants to proceed and I can order Prolia.

## 2014-02-07 ENCOUNTER — Telehealth: Payer: Self-pay

## 2014-02-07 NOTE — Telephone Encounter (Signed)
The patient called and is wondering if her proliea shot has been ordered and if it is in the office.   Thanks!

## 2014-02-07 NOTE — Telephone Encounter (Signed)
Spoke to patient to let her know that when the injection is available she will be notified and a nurse visit will be scheduled

## 2014-02-26 ENCOUNTER — Ambulatory Visit (INDEPENDENT_AMBULATORY_CARE_PROVIDER_SITE_OTHER): Payer: Medicare Other | Admitting: *Deleted

## 2014-02-26 DIAGNOSIS — M810 Age-related osteoporosis without current pathological fracture: Secondary | ICD-10-CM

## 2014-02-26 MED ORDER — DENOSUMAB 60 MG/ML ~~LOC~~ SOLN
60.0000 mg | Freq: Once | SUBCUTANEOUS | Status: AC
  Administered 2014-02-26: 60 mg via SUBCUTANEOUS

## 2014-08-26 ENCOUNTER — Telehealth: Payer: Self-pay

## 2014-08-26 NOTE — Telephone Encounter (Signed)
Yes the medication has to be ordered and preapproved from a insurance standpoint.  Can you check when her last shot was? And I will get the process started.

## 2014-08-26 NOTE — Telephone Encounter (Signed)
Rose,  Can we get the process started for this patient to get his Prolia.  It was due on 7/1. Thanks

## 2014-08-26 NOTE — Telephone Encounter (Signed)
12/18/14

## 2014-08-26 NOTE — Telephone Encounter (Signed)
The patient called and is hoping to get a prolia shot sometime soon. Does this med need to be ordered? (before scheduling the injection apt)

## 2014-08-30 NOTE — Telephone Encounter (Signed)
Patient called frantic because she had not heard anything on Prolia injection called and advised patient that PA was in process and as soon as insurance approved Prolia would be ordered and patient would be scheduled for injection.

## 2014-09-02 NOTE — Telephone Encounter (Signed)
Pt's info was submitted electronically for [employer] insurance verification and I will notify you once I receive a response. Thank you.

## 2014-09-02 NOTE — Telephone Encounter (Signed)
FYI

## 2014-09-16 ENCOUNTER — Telehealth: Payer: Self-pay | Admitting: Internal Medicine

## 2014-09-16 NOTE — Telephone Encounter (Signed)
Pt wanted to check the status of getting prolia shot. Please advise/msn

## 2014-09-17 ENCOUNTER — Ambulatory Visit: Payer: Medicare Other

## 2014-09-17 ENCOUNTER — Telehealth: Payer: Self-pay | Admitting: Internal Medicine

## 2014-09-17 ENCOUNTER — Telehealth: Payer: Self-pay

## 2014-09-17 NOTE — Telephone Encounter (Signed)
Originally scheduled for AWV.  Spent 45 minutes discussing issues surrounding her awaited Prolia injection, medication changes and labs.  Deferred to Dr. Darrick Huntsman for medication changes. Rescheduled initial AWV when annual labs are due in November.

## 2014-09-17 NOTE — Telephone Encounter (Signed)
Patient wants to confirm its okay for her to stop taking BONIVA  (1 tablet by mouth every 30 days).  She is expecting to have the second Prolia injection soon.  Please advise.

## 2014-09-17 NOTE — Telephone Encounter (Signed)
Yes, I have rec'd the insurance verification and you are never a bother.  Pt will have a $50 co-pay which will cover the cost of Prolia and a $20 co-pay which will cover the admin and if billed the OV for a total estimated responsibility of $70.  Please make pt aware this is an estimate and we will not know an exact amt until the insurance has paid.  I have sent a copy of the summary of benefits to be scanned into her chart.  If Natalie Coleman cannot afford $70 for her injection, please advise her to contact Prolia at [phone] and select option #1 to see if she qualifies for one of their assistance programs.  If she qualifies they will instruct her how to proceed.  If you have any questions, please let me know. Thank you.

## 2014-09-17 NOTE — Telephone Encounter (Signed)
No,  She does not need both,  Can stop boniva

## 2014-09-17 NOTE — Telephone Encounter (Signed)
Please advise on Boniva, should patient continue?

## 2014-09-17 NOTE — Telephone Encounter (Signed)
This nurse talked with patient today in clinic concerning her approval for Prolia sent to [facility] concerning orderind patient prolia.  Please advise if patient should continue Boniva with Prolia.

## 2014-09-17 NOTE — Telephone Encounter (Signed)
Patient coming into office to see Health Coach today have printed out information for health coach to advise patient.

## 2014-09-17 NOTE — Telephone Encounter (Signed)
Prolia ordered today 09/17/14

## 2014-09-17 NOTE — Telephone Encounter (Signed)
Called and made patient aware ok to stop BONIVA.  Patient verbalized understanding.

## 2014-09-17 NOTE — Telephone Encounter (Signed)
Have you heard anything on this patient Prolia? Sorry to bother you.

## 2014-09-17 NOTE — Telephone Encounter (Signed)
As requested by patient, I called to let her know the Prolia injection had not yet been approved by her insurance. Patient understands and will continue to wait to hear back from our office.

## 2014-09-17 NOTE — Telephone Encounter (Signed)
I just sent a telephone note to Olegario Messier about this.  Please see telephone note labeled Prolia, dated 08/26/2014. Thank you.

## 2014-09-17 NOTE — Telephone Encounter (Signed)
Have talked with patient ad explained the cost associated with prolia injection patient is willing to pay and would like prolia ordered.

## 2014-09-26 ENCOUNTER — Ambulatory Visit (INDEPENDENT_AMBULATORY_CARE_PROVIDER_SITE_OTHER): Payer: Medicare Other

## 2014-09-26 DIAGNOSIS — M810 Age-related osteoporosis without current pathological fracture: Secondary | ICD-10-CM

## 2014-09-26 MED ORDER — DENOSUMAB 60 MG/ML ~~LOC~~ SOLN
60.0000 mg | Freq: Once | SUBCUTANEOUS | Status: AC
  Administered 2014-09-26: 60 mg via SUBCUTANEOUS

## 2014-10-09 ENCOUNTER — Other Ambulatory Visit: Payer: Self-pay

## 2014-10-09 DIAGNOSIS — Z1231 Encounter for screening mammogram for malignant neoplasm of breast: Secondary | ICD-10-CM

## 2014-10-16 ENCOUNTER — Ambulatory Visit: Payer: Medicare Other

## 2014-10-24 ENCOUNTER — Ambulatory Visit
Admission: RE | Admit: 2014-10-24 | Discharge: 2014-10-24 | Disposition: A | Discharge status: Home / Self Care | Payer: Medicare Other | Source: Ambulatory Visit

## 2014-10-24 DIAGNOSIS — Z1231 Encounter for screening mammogram for malignant neoplasm of breast: Secondary | ICD-10-CM

## 2014-10-28 ENCOUNTER — Other Ambulatory Visit: Payer: Self-pay | Admitting: Internal Medicine

## 2014-10-28 DIAGNOSIS — R928 Other abnormal and inconclusive findings on diagnostic imaging of breast: Secondary | ICD-10-CM

## 2014-11-01 ENCOUNTER — Ambulatory Visit
Admission: RE | Admit: 2014-11-01 | Discharge: 2014-11-01 | Disposition: A | Discharge status: Home / Self Care | Payer: Medicare Other | Source: Ambulatory Visit | Attending: Internal Medicine | Admitting: Internal Medicine

## 2014-11-01 DIAGNOSIS — R928 Other abnormal and inconclusive findings on diagnostic imaging of breast: Secondary | ICD-10-CM

## 2014-12-30 ENCOUNTER — Ambulatory Visit (INDEPENDENT_AMBULATORY_CARE_PROVIDER_SITE_OTHER): Payer: Medicare Other

## 2014-12-30 VITALS — BP 116/72 | HR 71 | Temp 98.1°F | Resp 12 | Ht <= 58 in | Wt 109.8 lb

## 2014-12-30 DIAGNOSIS — Z0000 Encounter for general adult medical examination without abnormal findings: Secondary | ICD-10-CM | POA: Diagnosis not present

## 2014-12-30 DIAGNOSIS — E785 Hyperlipidemia, unspecified: Secondary | ICD-10-CM

## 2014-12-30 LAB — CBC WITH DIFFERENTIAL/PLATELET
BASOS ABS: 0 10*3/uL (ref 0.0–0.1)
Basophils Relative: 0.5 % (ref 0.0–3.0)
EOS ABS: 0.2 10*3/uL (ref 0.0–0.7)
Eosinophils Relative: 3.9 % (ref 0.0–5.0)
HCT: 37.5 % (ref 36.0–46.0)
Hemoglobin: 12.6 g/dL (ref 12.0–15.0)
LYMPHS ABS: 2.1 10*3/uL (ref 0.7–4.0)
LYMPHS PCT: 32 % (ref 12.0–46.0)
MCHC: 33.5 g/dL (ref 30.0–36.0)
MCV: 91 fl (ref 78.0–100.0)
MONOS PCT: 8 % (ref 3.0–12.0)
Monocytes Absolute: 0.5 10*3/uL (ref 0.1–1.0)
NEUTROS PCT: 55.6 % (ref 43.0–77.0)
Neutro Abs: 3.6 10*3/uL (ref 1.4–7.7)
Platelets: 233 10*3/uL (ref 150.0–400.0)
RBC: 4.12 Mil/uL (ref 3.87–5.11)
RDW: 13 % (ref 11.5–15.5)
WBC: 6.4 10*3/uL (ref 4.0–10.5)

## 2014-12-30 LAB — COMPREHENSIVE METABOLIC PANEL
ALK PHOS: 40 U/L (ref 39–117)
ALT: 15 U/L (ref 0–35)
AST: 19 U/L (ref 0–37)
Albumin: 4.2 g/dL (ref 3.5–5.2)
BILIRUBIN TOTAL: 0.5 mg/dL (ref 0.2–1.2)
BUN: 24 mg/dL — AB (ref 6–23)
CO2: 29 mEq/L (ref 19–32)
CREATININE: 0.7 mg/dL (ref 0.40–1.20)
Calcium: 9.8 mg/dL (ref 8.4–10.5)
Chloride: 109 mEq/L (ref 96–112)
GFR: 86.43 mL/min (ref >60.00)
GLUCOSE: 85 mg/dL (ref 70–99)
Potassium: 4.4 mEq/L (ref 3.5–5.1)
Sodium: 144 mEq/L (ref 135–145)
TOTAL PROTEIN: 6.8 g/dL (ref 6.0–8.3)

## 2014-12-30 LAB — LIPID PANEL
CHOL/HDL RATIO: 3
Cholesterol: 184 mg/dL (ref 0–200)
HDL: 57.9 mg/dL (ref >39.00)
LDL Cholesterol: 108 mg/dL — ABNORMAL HIGH (ref 0–99)
NONHDL: 126.28
Triglycerides: 92 mg/dL (ref 0.0–149.0)
VLDL: 18.4 mg/dL (ref 0.0–40.0)

## 2014-12-30 NOTE — Patient Instructions (Addendum)
Natalie Coleman,  Thank you for taking time to come for your Medicare Wellness Visit.  I appreciate your ongoing commitment to your health goals. Please review the following plan we discussed and let me know if I can assist you in the future.  Return in 2 weeks for complete physical exam    Fall Prevention in the Home  Falls can cause injuries. They can happen to people of all ages. There are many things you can do to make your home safe and to help prevent falls.  WHAT CAN I DO ON THE OUTSIDE OF MY HOME?  Regularly fix the edges of walkways and driveways and fix any cracks.  Remove anything that might make you trip as you walk through a door, such as a raised step or threshold.  Trim any bushes or trees on the path to your home.  Use bright outdoor lighting.  Clear any walking paths of anything that might make someone trip, such as rocks or tools.  Regularly check to see if handrails are loose or broken. Make sure that both sides of any steps have handrails.  Any raised decks and porches should have guardrails on the edges.  Have any leaves, snow, or ice cleared regularly.  Use sand or salt on walking paths during winter.  Clean up any spills in your garage right away. This includes oil or grease spills. WHAT CAN I DO IN THE BATHROOM?   Use night lights.  Install grab bars by the toilet and in the tub and shower. Do not use towel bars as grab bars.  Use non-skid mats or decals in the tub or shower.  If you need to sit down in the shower, use a plastic, non-slip stool.  Keep the floor dry. Clean up any water that spills on the floor as soon as it happens.  Remove soap buildup in the tub or shower regularly.  Attach bath mats securely with double-sided non-slip rug tape.  Do not have throw rugs and other things on the floor that can make you trip. WHAT CAN I DO IN THE BEDROOM?  Use night lights.  Make sure that you have a light by your bed that is easy to reach.  Do  not use any sheets or blankets that are too big for your bed. They should not hang down onto the floor.  Have a firm chair that has side arms. You can use this for support while you get dressed.  Do not have throw rugs and other things on the floor that can make you trip. WHAT CAN I DO IN THE KITCHEN?  Clean up any spills right away.  Avoid walking on wet floors.  Keep items that you use a lot in easy-to-reach places.  If you need to reach something above you, use a strong step stool that has a grab bar.  Keep electrical cords out of the way.  Do not use floor polish or wax that makes floors slippery. If you must use wax, use non-skid floor wax.  Do not have throw rugs and other things on the floor that can make you trip. WHAT CAN I DO WITH MY STAIRS?  Do not leave any items on the stairs.  Make sure that there are handrails on both sides of the stairs and use them. Fix handrails that are broken or loose. Make sure that handrails are as long as the stairways.  Check any carpeting to make sure that it is firmly attached to the stairs.  Fix any carpet that is loose or worn.  Avoid having throw rugs at the top or bottom of the stairs. If you do have throw rugs, attach them to the floor with carpet tape.  Make sure that you have a light switch at the top of the stairs and the bottom of the stairs. If you do not have them, ask someone to add them for you. WHAT ELSE CAN I DO TO HELP PREVENT FALLS?  Wear shoes that:  Do not have high heels.  Have rubber bottoms.  Are comfortable and fit you well.  Are closed at the toe. Do not wear sandals.  If you use a stepladder:  Make sure that it is fully opened. Do not climb a closed stepladder.  Make sure that both sides of the stepladder are locked into place.  Ask someone to hold it for you, if possible.  Clearly mark and make sure that you can see:  Any grab bars or handrails.  First and last steps.  Where the edge of each  step is.  Use tools that help you move around (mobility aids) if they are needed. These include:  Canes.  Walkers.  Scooters.  Crutches.  Turn on the lights when you go into a dark area. Replace any light bulbs as soon as they burn out.  Set up your furniture so you have a clear path. Avoid moving your furniture around.  If any of your floors are uneven, fix them.  If there are any pets around you, be aware of where they are.  Review your medicines with your doctor. Some medicines can make you feel dizzy. This can increase your chance of falling. Ask your doctor what other things that you can do to help prevent falls.   This information is not intended to replace advice given to you by your health care provider. Make sure you discuss any questions you have with your health care provider.   Document Released: 11/28/2008 Document Revised: 06/18/2014 Document Reviewed: 03/08/2014 Elsevier Interactive Patient Education [employer]

## 2014-12-30 NOTE — Progress Notes (Signed)
Subjective:   Natalie Coleman is a 74 y.o. female who presents for Medicare Annual (Subsequent) preventive examination.  Review of Systems:  No ROS.  Medicare Wellness Visit. Cardiac Risk Factors include: none;advanced age (>4755men, 35>65 women)     Objective:     Vitals: BP 116/72 mmHg  Pulse 71  Temp(Src) 98.1 F (36.7 C) (Oral)  Resp 12  Ht 4' 6.5" (1.384 m)  Wt 109 lb 12.8 oz (49.805 kg)  BMI 26.00 kg/m2  SpO2 98%  Tobacco History  Smoking status  . Never Smoker   Smokeless tobacco  . Never Used     Counseling given: Not Answered   Past Medical History  Diagnosis Date  . GERD (gastroesophageal reflux disease)   . Colon polyps   . History of blood transfusion   . Hemorrhoids    Past Surgical History  Procedure Laterality Date  . Fracture surgery      right tibia  . Cholecystectomy  2008  . Orif tibia & fibula fractures  March 2006  . Orif ankle fracture bimalleolar  March 2006  . Colonoscopy  2009  . Eye surgery Right   . Cesarean section  [id number]   Family History  Problem Relation Age of Onset  . Hypertension Father   . Cancer Neg Hx   . Hypertension Mother   . Hypertension Sister    History  Sexual Activity  . Sexual Activity: No    Outpatient Encounter Prescriptions as of 12/30/2014  Medication Sig  . aspirin 81 MG tablet Take 81 mg by mouth daily.  . Biotin 5000 MCG CAPS Take 1 capsule by mouth daily.  . calcium carbonate 200 MG capsule Take 250 mg by mouth 2 (two) times daily with a meal.  . Cholecalciferol (VITAMIN D3) 1000 UNITS CAPS Take 2,000 Units by mouth daily.   . Coenzyme Q10 100 MG TABS Take 1 tablet by mouth daily.  Marland Kitchen. denosumab (PROLIA) 60 MG/ML SOLN injection Inject 60 mg into the skin every 6 (six) months. Administer in upper arm, thigh, or abdomen  . GARLIC PO Take 1 capsule by mouth daily.  . Lactobacillus-Inulin (CULTURELLE DIGESTIVE HEALTH) CAPS Take 1 capsule by mouth daily.  . Magnesium 250 MG TABS Take 1 tablet  by mouth daily.  . Melatonin 5 MG TABS Take 1 tablet by mouth as needed.  . Misc Natural Products (OSTEO BI-FLEX TRIPLE STRENGTH) TABS Take 1 tablet by mouth daily.  . Multiple Vitamins-Minerals (CENTRUM SILVER PO) Take 1 tablet by mouth daily.  Marland Kitchen. OVER THE COUNTER MEDICATION Hair Volume - 1 tab by mouth daily.  Marland Kitchen. VALERIAN ROOT PO Take 1 capsule by mouth as needed.  . vitamin B-12 (CYANOCOBALAMIN) 1000 MCG tablet Take 5,000 mcg by mouth daily.   . vitamin C (ASCORBIC ACID) 500 MG tablet Take 500 mg by mouth daily.  Marland Kitchen. zinc gluconate 50 MG tablet Take 50 mg by mouth daily.  . [DISCONTINUED] cephALEXin (KEFLEX) 500 MG capsule Take 1 capsule (500 mg total) by mouth 3 (three) times daily. With food  . [DISCONTINUED] ibandronate (BONIVA) 150 MG tablet TAKE 1 TABLET BY MOUTH EVERY 30 DAYS  . Tdap (BOOSTRIX) 5-2.5-18.5 LF-MCG/0.5 injection Inject 0.5 mLs into the muscle once. (Patient not taking: Reported on 12/30/2014)   No facility-administered encounter medications on file as of 12/30/2014.    Activities of Daily Living In your present state of health, do you have any difficulty performing the following activities: 12/30/2014  Hearing? N  Vision? N  Walking or climbing stairs? Y  Dressing or bathing? N  Doing errands, shopping? N  Preparing Food and eating ? N  Using the Toilet? N  In the past six months, have you accidently leaked urine? N  Do you have problems with loss of bowel control? N  Managing your Medications? N  Managing your Finances? N  Housekeeping or managing your Housekeeping? N    Patient Care Team: Sherlene Shams, MD as PCP - General (Internal Medicine) Earline Mayotte, MD (General Surgery) Sherlene Shams, MD (Internal Medicine)    Assessment:    This is a routine wellness examination for Natalie Coleman. The goal of the wellness visit is to assist the patient how to close the gaps in care and create a preventative care plan for the patient.   Medications reviewed;  taking as prescribed and without barriers.  Calcium VIT D as appropriate/Osteoporosis discussed.  Labs completed today:  CBC, CMP, LIPID  Influenza vaccine declined.  TDAP postponed per patient request.  Follow up with PCP.  Safety issues reviewed; smoke detectors in the home. No firearms in the home. Wears seatbelts when driving or riding with others. No violence in the home.  The patient was oriented x 3; appropriate in dress and manner and no objective failures at ADL's or IADL's.   Patient concerns: Not at this time.  Follow up with PCP as needed.    Exercise Activities and Dietary recommendations Current Exercise Habits:: Home exercise routine (Arm raises in the chair 2-3 minutes), Intensity: Mild  Goals    . Increase physical activity     Continue doing arm raises at home.  Incorporate leg lifts as demonstrated, as tolerated. Education provided.      Fall Risk Fall Risk  12/30/2014 07/18/2013  Falls in the past year? No No   Depression Screen PHQ 2/9 Scores 12/30/2014 07/18/2013  PHQ - 2 Score 0 0     Cognitive Testing MMSE - Mini Mental State Exam 12/30/2014  Orientation to time 5  Orientation to Place 5  Registration 3  Attention/ Calculation 5  Recall 3  Language- name 2 objects 2  Language- repeat 1  Language- follow 3 step command 3  Language- read & follow direction 1  Write a sentence 1  Copy design 1  Total score 30    Immunization History  Administered Date(s) Administered  . Pneumococcal Conjugate-13 08/15/2012  . Pneumococcal Polysaccharide-23 08/17/2004  . Td 08/15/2004  . Zoster 01/18/2014   Screening Tests Health Maintenance  Topic Date Due  . TETANUS/TDAP  08/16/2014  . INFLUENZA VACCINE  01/03/2015 (Originally 09/16/2014)  . DEXA SCAN  Completed  . ZOSTAVAX  Completed  . PNA vac Low Risk Adult  Completed      Plan:   End of life planning; Advance aging; Advanced directives discussed. Copy requested of HCPOA/LivingWill short  forms once completed.    Return in 2 weeks for scheduled CPE with PCP.  Return in 1 year for annual wellness visit with Health Coach.  During the course of the visit the patient was educated and counseled about the following appropriate screening and preventive services:   Vaccines to include Pneumoccal, Influenza, Hepatitis B, Td, Zostavax, HCV  Electrocardiogram  Cardiovascular Disease  Colorectal cancer screening  Bone density screening  Diabetes screening  Glaucoma screening  Mammography/PAP  Nutrition counseling   Patient Instructions (the written plan) was given to the patient.   Ashok Pall, LPN  [id number]

## 2014-12-30 NOTE — Progress Notes (Signed)
  I have reviewed the above information and agree with above.   Jacquita Mulhearn, MD 

## 2014-12-31 ENCOUNTER — Encounter: Payer: Self-pay | Admitting: Surgical

## 2015-01-13 ENCOUNTER — Encounter: Payer: Self-pay | Admitting: Internal Medicine

## 2015-01-13 ENCOUNTER — Ambulatory Visit (INDEPENDENT_AMBULATORY_CARE_PROVIDER_SITE_OTHER): Payer: Medicare Other | Admitting: Internal Medicine

## 2015-01-13 VITALS — BP 126/78 | HR 78 | Temp 97.7°F | Resp 12 | Ht <= 58 in | Wt 108.5 lb

## 2015-01-13 DIAGNOSIS — E785 Hyperlipidemia, unspecified: Secondary | ICD-10-CM

## 2015-01-13 DIAGNOSIS — Z0000 Encounter for general adult medical examination without abnormal findings: Secondary | ICD-10-CM | POA: Diagnosis not present

## 2015-01-13 DIAGNOSIS — M810 Age-related osteoporosis without current pathological fracture: Secondary | ICD-10-CM

## 2015-01-13 DIAGNOSIS — Z23 Encounter for immunization: Secondary | ICD-10-CM

## 2015-01-13 DIAGNOSIS — Z86010 Personal history of colonic polyps: Secondary | ICD-10-CM

## 2015-01-13 NOTE — Patient Instructions (Signed)

## 2015-01-13 NOTE — Progress Notes (Signed)
Patient ID: Natalie Coleman, female    DOB: [birth year]  Age: 74 y.o. MRN: [id number]  The patient is here for annual physicall  examination and management of other chronic and acute problems.  She is requesting a PAP smear .  She has no history of abnormal PAP smears, is not sexually active and endorses no recurrent or chronic urogyn  Complaints despite being 70 rs old.  Colon CA screening:  Tubular adenoma 2009 colonoscopy by Barnetta Chapel.  Sent to [facility] for follow up.  Patient deferred screening until she could confirm most recent evaluation not available to medical personnel.  She has consulted her home records which apparently agree that she is due this year.   She is seeing Dermatology for small skin tags on her chest wall,  Treatment is some cream.  Dr Roseanne Kaufman      The risk factors are reflected in the social history.  The roster of all physicians providing medical care to patient - is listed in the Snapshot section of the chart.  Activities of daily living:  The patient is 100% independent in all ADLs: dressing, toileting, feeding as well as independent mobility  Home safety : The patient has smoke detectors in the home. They wear seatbelts.  There are no firearms at home. There is no violence in the home.   There is no risks for hepatitis, STDs or HIV. There is no   history of blood transfusion. They have no travel history to infectious disease endemic areas of the world.  The patient has seen their dentist in the last six month. They have seen their eye doctor in the last year. They admit to slight hearing difficulty with regard to whispered voices and some television programs.  They have deferred audiologic testing in the last year.  They do not  have excessive sun exposure. Discussed the need for sun protection: hats, long sleeves and use of sunscreen if there is significant sun exposure.   Diet: the importance of a healthy diet is discussed. They do have a healthy  diet.  The benefits of regular aerobic exercise were discussed. She walks 4 times per week ,  20 minutes.   Depression screen: there are no signs or vegative symptoms of depression- irritability, change in appetite, anhedonia, sadness/tearfullness.  Cognitive assessment: the patient manages all their financial and personal affairs and is actively engaged. They could relate day,date,year and events; recalled 2/3 objects at 3 minutes; performed clock-face test normally.  The following portions of the patient's history were reviewed and updated as appropriate: allergies, current medications, past family history, past medical history,  past surgical history, past social history  and problem list.  Visual acuity was not assessed per patient preference since she has regular follow up with her ophthalmologist. Hearing and body mass index were assessed and reviewed.   During the course of the visit the patient was educated and counseled about appropriate screening and preventive services including : fall prevention , diabetes screening, nutrition counseling, colorectal cancer screening, and recommended immunizations.    CC: The primary encounter diagnosis was Encounter for preventive health examination. Diagnoses of Need for prophylactic vaccination against Streptococcus pneumoniae (pneumococcus), Osteoporosis, postmenopausal, Hyperlipidemia with target LDL less than 130, and History of colonic polyps were also pertinent to this visit.  History Elizibeth has a past medical history of GERD (gastroesophageal reflux disease); Colon polyps; History of blood transfusion; and Hemorrhoids.   She has past surgical history that includes Fracture surgery; Cholecystectomy (2008); ORIF  tibia & fibula fractures (March 2006); ORIF ankle fracture bimalleolar (March 2006); Colonoscopy (2009); Eye surgery (Right); and Cesarean section ([id number]).   Her family history includes Hypertension in her father, mother, and  sister. There is no history of Cancer.She reports that she has never smoked. She has never used smokeless tobacco. She reports that she drinks about 0.6 oz of alcohol per week. She reports that she does not use illicit drugs.  Outpatient Prescriptions Prior to Visit  Medication Sig Dispense Refill  . aspirin 81 MG tablet Take 81 mg by mouth daily.    . Biotin 5000 MCG CAPS Take 1 capsule by mouth daily.    . calcium carbonate 200 MG capsule Take 250 mg by mouth 2 (two) times daily with a meal. Patient taking 2 tablet three times daily    . Cholecalciferol (VITAMIN D3) 1000 UNITS CAPS Take 2,000 Units by mouth daily.     . Coenzyme Q10 100 MG TABS Take 1 tablet by mouth daily.    Marland Kitchen denosumab (PROLIA) 60 MG/ML SOLN injection Inject 60 mg into the skin every 6 (six) months. Administer in upper arm, thigh, or abdomen 180 mL 0  . GARLIC PO Take 1 capsule by mouth daily.    . Lactobacillus-Inulin (CULTURELLE DIGESTIVE HEALTH) CAPS Take 1 capsule by mouth daily. 14 capsule 0  . Magnesium 250 MG TABS Take 1 tablet by mouth daily.    . Melatonin 5 MG TABS Take 1 tablet by mouth as needed.    . Misc Natural Products (OSTEO BI-FLEX TRIPLE STRENGTH) TABS Take 1 tablet by mouth daily.    . Multiple Vitamins-Minerals (CENTRUM SILVER PO) Take 1 tablet by mouth daily.    Marland Kitchen OVER THE COUNTER MEDICATION Hair Volume - 1 tab by mouth daily.    Marland Kitchen VALERIAN ROOT PO Take 1 capsule by mouth as needed.    . vitamin B-12 (CYANOCOBALAMIN) 1000 MCG tablet Take 5,000 mcg by mouth daily.     . vitamin C (ASCORBIC ACID) 500 MG tablet Take 500 mg by mouth daily.    Marland Kitchen zinc gluconate 50 MG tablet Take 50 mg by mouth daily.    . Tdap (BOOSTRIX) 5-2.5-18.5 LF-MCG/0.5 injection Inject 0.5 mLs into the muscle once. (Patient not taking: Reported on 12/30/2014) 0.5 mL 0   No facility-administered medications prior to visit.    Review of Systems   Patient denies headache, fevers, malaise, unintentional weight loss, skin rash, eye  pain, sinus congestion and sinus pain, sore throat, dysphagia,  hemoptysis , cough, dyspnea, wheezing, chest pain, palpitations, orthopnea, edema, abdominal pain, nausea, melena, diarrhea, constipation, flank pain, dysuria, hematuria, urinary  Frequency, nocturia, numbness, tingling, seizures,  Focal weakness, Loss of consciousness,  Tremor, insomnia, depression, anxiety, and suicidal ideation.      Objective:  BP 126/78 mmHg  Pulse 78  Temp(Src) 97.7 F (36.5 C) (Oral)  Resp 12  Ht  (1.397 m)  Wt 108 lb 8 oz (49.215 kg)  BMI 25.22 kg/m2  SpO2 99%  Physical Exam  General appearance: alert, cooperative and appears stated age Head: Normocephalic, without obvious abnormality, atraumatic Eyes: conjunctivae/corneas clear. PERRL, EOM's intact. Fundi benign. Ears: normal TM's and external ear canals both ears Nose: Nares normal. Septum midline. Mucosa normal. No drainage or sinus tenderness. Throat: lips, mucosa, and tongue normal; teeth and gums normal Neck: no adenopathy, no carotid bruit, no JVD, supple, symmetrical, trachea midline and thyroid not enlarged, symmetric, no tenderness/mass/nodules Lungs: clear to auscultation bilaterally Breasts: normal appearance,  no masses or tenderness Heart: regular rate and rhythm, S1, S2 normal, no murmur, click, rub or gallop Abdomen: soft, non-tender; bowel sounds normal; no masses,  no organomegaly Extremities: extremities normal, atraumatic, no cyanosis or edema Pulses: 2+ and symmetric Skin: Skin color, texture, turgor normal. No rashes or lesions Neurologic: Alert and oriented X 3, normal strength and tone. Normal symmetric reflexes. Normal coordination and gait.    Assessment & Plan:   Problem List Items Addressed This Visit    History of colonic polyps    She is due for follow up colonoscopy.  Advised to see Dr Lemar LivingsByrnett to set this up.       Hyperlipidemia with target LDL less than 130    LDL and triglycerides have been  acceptable on current diet, she is exercising regularly and liver enzymes are normal.  Lab Results  Component Value Date   CHOL 184 12/30/2014   HDL 57.90 12/30/2014   LDLCALC 108* 12/30/2014   LDLDIRECT 133.6 12/28/2013   TRIG 92.0 12/30/2014   CHOLHDL 3 12/30/2014  '        Osteoporosis, postmenopausal    T scores have been stable for over 5 years with no history of fragility fractures on Boniva.  She does not want to consider change in therapy with  Prolia.   Lab Results  Component Value Date   CREATININE 0.70 12/30/2014  '        Encounter for preventive health examination - Primary    Annual comprehensive preventive exam was done as well as an evaluation and management of chronic conditions .  During the course of the visit the patient was educated and counseled about appropriate screening and preventive services including :  diabetes screening, lipid analysis with projected  10 year  risk for CAD  using the Framingham risk calculator for women, , nutrition counseling, colorectal cancer screening, and recommended immunizations.  Printed recommendations for health maintenance screenings was given.        Other Visit Diagnoses    Need for prophylactic vaccination against Streptococcus pneumoniae (pneumococcus)        Relevant Orders    Pneumococcal polysaccharide vaccine 23-valent greater than or equal to 2yo subcutaneous/IM (Completed)       I am having Ms. Homesley maintain her Multiple Vitamins-Minerals (CENTRUM SILVER PO), GARLIC PO, OSTEO BI-FLEX TRIPLE STRENGTH, Vitamin D3, vitamin C, vitamin B-12, aspirin, VALERIAN ROOT PO, Melatonin, CULTURELLE DIGESTIVE HEALTH, calcium carbonate, Magnesium, zinc gluconate, Biotin, Coenzyme Q10, OVER THE COUNTER MEDICATION, Tdap, and denosumab.  No orders of the defined types were placed in this encounter.    There are no discontinued medications.  Follow-up: No Follow-up on file.   Sherlene ShamsULLO, Heidi Maclin L, MD

## 2015-01-13 NOTE — Progress Notes (Signed)
Pre-visit discussion using our clinic review tool. No additional management support is needed unless otherwise documented below in the visit note.  

## 2015-01-13 NOTE — Assessment & Plan Note (Signed)

## 2015-01-14 ENCOUNTER — Encounter: Payer: Self-pay | Admitting: Internal Medicine

## 2015-01-14 NOTE — Assessment & Plan Note (Signed)
LDL and triglycerides have been acceptable on current diet, she is exercising regularly and liver enzymes are normal.  Lab Results  Component Value Date   CHOL 184 12/30/2014   HDL 57.90 12/30/2014   LDLCALC 108* 12/30/2014   LDLDIRECT 133.6 12/28/2013   TRIG 92.0 12/30/2014   CHOLHDL 3 12/30/2014  '

## 2015-01-14 NOTE — Assessment & Plan Note (Addendum)
T scores have been stable for over 5 years with no history of fragility fractures on Boniva.  She does not want to consider change in therapy with  Prolia.   Lab Results  Component Value Date   CREATININE 0.70 12/30/2014  '   

## 2015-01-14 NOTE — Assessment & Plan Note (Signed)
She is due for follow up colonoscopy.  Advised to see Dr Lemar LivingsByrnett to set this up.

## 2015-01-24 ENCOUNTER — Telehealth: Payer: Self-pay

## 2015-01-24 NOTE — Telephone Encounter (Signed)
Message left for patient to call back to see if she wants to be seen for a Colonoscopy discussion with Dr Lemar LivingsByrnett.

## 2015-01-24 NOTE — Telephone Encounter (Signed)
-----   Message from Earline MayotteJeffrey W Byrnett, MD sent at 01/14/2015 11:30 AM EST ----- Patient was seen in June 2015 Re: A follow-up colonoscopy. Just received a note from Dr. to low that the patient is now interested in proceeding. Please contact her and see if she's going to have this completed through our office or elsewhere. Thank you

## 2015-01-24 NOTE — Telephone Encounter (Signed)
01-24-15 PT CALLED BACK.APPOINTMENT WAS MADE FOR 02-24-2015 @ 1:00 FOR DR Darron DoomBYRNETT/MTH

## 2015-02-24 ENCOUNTER — Ambulatory Visit: Payer: Self-pay | Admitting: General Surgery

## 2015-03-04 ENCOUNTER — Telehealth: Payer: Self-pay | Admitting: *Deleted

## 2015-03-04 NOTE — Telephone Encounter (Signed)
Patient was requesting to have her Prolia vaccine, due for next month. She would like to get the process for this started, so that she can receive it on time. Please contact patient [phone] with update.

## 2015-03-04 NOTE — Telephone Encounter (Signed)
Please advise on Prolia.  Thanks

## 2015-03-21 ENCOUNTER — Telehealth: Payer: Self-pay | Admitting: Internal Medicine

## 2015-03-21 NOTE — Telephone Encounter (Signed)
I have electronically submitted pt's info for Prolia insurance verification and will notify you once I have a response. Thank you. °

## 2015-03-24 NOTE — Telephone Encounter (Signed)
Opened in error

## 2015-04-02 NOTE — Telephone Encounter (Signed)
First, please offer the patient my sincerest apologies.  I had a family emergency w/my Mom and was out some which caused me to get behind in submitting the verification so some of it is my fault.  Please let her know I am so sorry or if you prefer I can call her and apologize. Even once I sent in the verification it takes a while to get the info back and unfortunately, I cannot speed up that process b/c it's on [employer]'s end.  As far as the increase in cost she will need to check w/her insurance on that.  I only went by what the verification said.  You could talk to [place] or the team lead and see if it would be ok to collect $70 at time of injection and let the Ms. Juncaj know that if insurance processes it so that $90 is her responsibility then she will still owe the $20.  Again, I do offer my sincerest apologies to her, you, and Dr. Darrick Huntsman for the inconvenience.  Thank you.

## 2015-04-02 NOTE — Telephone Encounter (Signed)
I tried to explain that each year the insurance may be different and the cost may have gone up, she was i think more mad that nothing seemed to be correct in her eyes.  I will see her next week at the appt so I will talk with her.  I explained that the insurance process can take weeks or even a month sometimes.

## 2015-04-02 NOTE — Telephone Encounter (Signed)
Spoke with the patient, she is not happy with the 90, she states she paid 70 last time and wants to know why it was increased.  She scheduled her appt for next week regardless as she is upset that it has taken so long to get the approval.  Please advise?

## 2015-04-02 NOTE — Telephone Encounter (Signed)
Attempted to reach the patient.  Left a VM to return my call.

## 2015-04-02 NOTE — Telephone Encounter (Signed)
I have rec'd Ms. Tirado's Engineer, manufacturing systems for Prolia.  Ms. Loura Pardon will have an estimated responsibility of a $14 co-pay whether an OV is billed or not.  Please make pt aware this is an estimate and we will not know an exact amt until insurance(s) has/have paid.  I have sent a copy of the summary of benefits to be scanned into pt's chart.    If she cannot afford $90 for her injection, please advise her to contact [employer] at [phone] and select option #1 to see if she qualifies for one of their assistance programs.  If she qualifies they will instruct her how to proceed.     Once pt recs injection, please let me know actual injection date so I can update the Prolia portal.  If you have any questions, please let me know.  Thank you.

## 2015-04-02 NOTE — Telephone Encounter (Signed)
Thank you :)

## 2015-04-03 NOTE — Telephone Encounter (Signed)
Injection in Fridge for appt on 2/20 at 10am

## 2015-04-07 ENCOUNTER — Ambulatory Visit (INDEPENDENT_AMBULATORY_CARE_PROVIDER_SITE_OTHER): Payer: Medicare Other

## 2015-04-07 DIAGNOSIS — M810 Age-related osteoporosis without current pathological fracture: Secondary | ICD-10-CM | POA: Diagnosis not present

## 2015-04-07 MED ORDER — DENOSUMAB 60 MG/ML ~~LOC~~ SOLN
60.0000 mg | Freq: Once | SUBCUTANEOUS | Status: AC
  Administered 2015-04-07: 60 mg via SUBCUTANEOUS

## 2015-04-07 NOTE — Telephone Encounter (Signed)
Patient received injection today at the office, thanks

## 2015-04-07 NOTE — Progress Notes (Signed)
Patient came in for Prolia injection.  Received in Left Arm Subcutaneous.  Patient tolerated well.

## 2015-05-02 ENCOUNTER — Encounter: Payer: Self-pay | Admitting: Family Medicine

## 2015-05-02 ENCOUNTER — Ambulatory Visit (INDEPENDENT_AMBULATORY_CARE_PROVIDER_SITE_OTHER): Payer: Medicare Other | Admitting: Family Medicine

## 2015-05-02 ENCOUNTER — Telehealth: Payer: Self-pay | Admitting: Internal Medicine

## 2015-05-02 VITALS — BP 122/88 | HR 74 | Temp 98.8°F | Ht <= 58 in | Wt 113.1 lb

## 2015-05-02 DIAGNOSIS — K0889 Other specified disorders of teeth and supporting structures: Secondary | ICD-10-CM

## 2015-05-02 NOTE — Telephone Encounter (Signed)
Patient Name: Natalie Coleman DOB: 01/12/1941 Initial Comment Caller States her gums have shrunken over the last few days only on the right side. she just had her 3rd injection for osteoporosis in feb. Nurse Assessment Nurse: Charna Elizabethrumbull, RN, Cathy Date/Time (Eastern Time): 05/02/2015 9:31:50 AM Confirm and document reason for call. If symptomatic, describe symptoms. You must click the next button to save text entered. ---Caller states that she developed receding gums a few days ago. No bleeding or pain. No fever. Has the patient traveled out of the country within the last 30 days? ---No Does the patient have any new or worsening symptoms? ---Yes Will a triage be completed? ---Yes Related visit to physician within the last 2 weeks? ---No Does the PT have any chronic conditions? (i.e. diabetes, asthma, etc.) ---Yes List chronic conditions. ---Osteoporosis Is this a behavioral health or substance abuse call? ---No Guidelines Guideline Title Affirmed Question Affirmed Notes Mouth Symptoms All other mouth symptoms (Exceptions: dry mouth from not drinking enough liquids, chapped lips) Final Disposition User See PCP When Office is Open (within 3 days) Charna Elizabethrumbull, RN, EMCORCathy Referrals REFERRED TO PCP OFFICE Disagree/Comply: Danella Maiersomply

## 2015-05-02 NOTE — Progress Notes (Signed)
Pre visit review using our clinic review tool, if applicable. No additional management support is needed unless otherwise documented below in the visit note. 

## 2015-05-02 NOTE — Patient Instructions (Signed)
Nice to see you. Please follow up with your dentist. Her gum irritation. If this worsens or he develop fever, drainage, redness, or jaw pain or any new or changing symptoms please seek medical attention.

## 2015-05-03 ENCOUNTER — Encounter: Payer: Self-pay | Admitting: Family Medicine

## 2015-05-03 DIAGNOSIS — K0889 Other specified disorders of teeth and supporting structures: Secondary | ICD-10-CM | POA: Insufficient documentation

## 2015-05-03 NOTE — Progress Notes (Signed)
Patient ID: Camillo Flamingievelis C Delaynee Alred, female   DOB: 12/04/1940, 75 y.o.   MRN: [id number]  Marikay AlarEric Gustavia Carie, MD Phone: [phone]  Camillo Flamingievelis C Dibbern is a 75 y.o. female who presents today for same-day visit.  Patient notes 3 days ago she developed gingival sensitivity on the right lower area of her mouth. She states that the gingiva in the area where she is missing 2 teeth sunk in at that time. This area became sensitive. The sensitivity has gone away. There is no pain or drainage from this area. There is no fever. She states she does get Prolene and thinks this may be a reaction to the Prolene. She feels well at this time.  PMH: nonsmoker.   ROS see history of present illness  Objective  Physical Exam Filed Vitals:   05/02/15 1619  BP: 122/88  Pulse: 74  Temp: 98.8 F (37.1 C)    BP Readings from Last 3 Encounters:  05/02/15 122/88  01/13/15 126/78  12/30/14 116/72   Wt Readings from Last 3 Encounters:  05/02/15 113 lb 2 oz (51.313 kg)  01/13/15 108 lb 8 oz (49.215 kg)  12/30/14 109 lb 12.8 oz (49.805 kg)    Physical Exam  Constitutional: She is well-developed, well-nourished, and in no distress.  HENT:  Head: Normocephalic.  Right Ear: External ear normal.  Left Ear: External ear normal.  Mouth/Throat: Oropharynx is clear and moist. No oropharyngeal exudate.    No tenderness of jaw, no swelling of jaw  Eyes: Conjunctivae are normal. Pupils are equal, round, and reactive to light.  Neck: Neck supple.  Cardiovascular: Normal rate, regular rhythm and normal heart sounds.   Pulmonary/Chest: Effort normal and breath sounds normal.  Lymphadenopathy:    She has no cervical adenopathy.  Neurological: She is alert. Gait normal.  Skin: Skin is warm and dry.     Assessment/Plan: Please see individual problem list.  Gingival pain Patient with overall benign exam today. There is no tenderness or fluctuance or erythema to indicate infection or abscess. No bony tenderness.  She is on Prolia and this could potentially cause osteonecrosis of the jaw though I would expect her to have continued discomfort and in the very least some abnormal exam findings. I discussed possible causes with patient. Also discussed that she had a benign exam today. Advised that she should follow-up with her dentist for further evaluation. She is given return precautions.    Marikay AlarEric Archibald Marchetta, MD [facility]

## 2015-05-03 NOTE — Assessment & Plan Note (Signed)
Patient with overall benign exam today. There is no tenderness or fluctuance or erythema to indicate infection or abscess. No bony tenderness. She is on Prolia and this could potentially cause osteonecrosis of the jaw though I would expect her to have continued discomfort and in the very least some abnormal exam findings. I discussed possible causes with patient. Also discussed that she had a benign exam today. Advised that she should follow-up with her dentist for further evaluation. She is given return precautions.

## 2015-08-28 ENCOUNTER — Encounter: Payer: Self-pay | Admitting: *Deleted

## 2015-09-09 ENCOUNTER — Ambulatory Visit: Payer: Medicare Other | Admitting: Internal Medicine

## 2015-09-15 ENCOUNTER — Ambulatory Visit: Payer: Medicare Other | Admitting: Internal Medicine

## 2015-09-17 ENCOUNTER — Telehealth: Payer: Self-pay | Admitting: *Deleted

## 2015-09-17 NOTE — Telephone Encounter (Signed)
Patient stated that her Prolia vaccine is coming up and would like to get the process going for vaccine. She would like to have the injection the day of her appt on 10/15/15.

## 2015-09-24 ENCOUNTER — Telehealth: Payer: Self-pay | Admitting: Internal Medicine

## 2015-09-24 NOTE — Telephone Encounter (Signed)
Patient is due for her Prolia, can you reverify it? She wants to come in on the 30th to receive it.  thanks

## 2015-09-24 NOTE — Telephone Encounter (Signed)
Ordered injection in Dimension 21. thanks

## 2015-09-24 NOTE — Telephone Encounter (Signed)
It has to be at least one day after the prior injection date 6 months prior, so it fits, thanks

## 2015-09-24 NOTE — Telephone Encounter (Signed)
Ok. Pt is scheduled. Thank you! °

## 2015-09-24 NOTE — Telephone Encounter (Signed)
Pt wants to know will 10/15/15 be to late for her to get the Prolia? Please advise?   Call pt @ [phone]. Thank you!

## 2015-09-25 NOTE — Telephone Encounter (Signed)
Injection in fridge on POD 1

## 2015-09-29 ENCOUNTER — Encounter (INDEPENDENT_AMBULATORY_CARE_PROVIDER_SITE_OTHER): Payer: Self-pay

## 2015-09-29 ENCOUNTER — Ambulatory Visit (INDEPENDENT_AMBULATORY_CARE_PROVIDER_SITE_OTHER): Payer: Medicare Other | Admitting: Internal Medicine

## 2015-09-29 DIAGNOSIS — E785 Hyperlipidemia, unspecified: Secondary | ICD-10-CM

## 2015-09-29 DIAGNOSIS — M810 Age-related osteoporosis without current pathological fracture: Secondary | ICD-10-CM

## 2015-09-29 DIAGNOSIS — F411 Generalized anxiety disorder: Secondary | ICD-10-CM | POA: Diagnosis not present

## 2015-09-29 DIAGNOSIS — K0889 Other specified disorders of teeth and supporting structures: Secondary | ICD-10-CM

## 2015-09-29 NOTE — Progress Notes (Signed)
Pre visit review using our clinic review tool, if applicable. No additional management support is needed unless otherwise documented below in the visit note. 

## 2015-09-29 NOTE — Progress Notes (Signed)
Subjective:  Patient ID: Natalie Coleman, female    DOB: 10/07/1940  Age: 75 y.o. MRN: [id number]  CC: Diagnoses of Gingival pain, Anxiety state, Hyperlipidemia with target LDL less than 130, and Osteoporosis, postmenopausal were pertinent to this visit.  HPI Natalie Coleman presents for follow up on multiple issues  CPE was Nov 2016  And also Saw Denisa for wellness visit  Had colonoscopy  y Marva PandaSkulskie hyperplastic polyp  Gingival pain :  Skin rash:  saw Dr Birdie SonsSonnenberg .  Thought she was having a Prolia reaction and gum receding,  Saw dentist,  everything was normal and to continue taking Prolia.  Started having itchy skin bumps in the axillae, upper arms, under breasts and inner thighs.  Saw dermatology  And was given a cream that helped and she needs a refill.  The rash is worse in the summer.  Not infection. Not sure who she saw.  (Isenstein )   Felt that a 4 lb weight  Gain resulted in increased pain in her right knee and increased feeling "down"  for two months.  Started a walking program  After buying a new pair of walking shoes and feels better. Lost a few lbs intentionally by following a restricted quantity  Diet   Mammogram due in Sept 16    colonscopy due this year   [facility]  Needs DEXA  Last one 2015.  On Prolia #4 Aug 21  Anxious  Started crying when discussed her fear of falling. ,  Has stopped dancing due to fear of falling and bvreaking somehting bc she lives indepenently      Outpatient Medications Prior to Visit  Medication Sig Dispense Refill  . aspirin 81 MG tablet Take 81 mg by mouth daily.    . Biotin 5000 MCG CAPS Take 1 capsule by mouth daily.    . calcium carbonate 200 MG capsule Take 250 mg by mouth 2 (two) times daily with a meal. Patient taking 2 tablet three times daily    . Cholecalciferol (VITAMIN D3) 1000 UNITS CAPS Take 2,000 Units by mouth daily.     . Coenzyme Q10 100 MG TABS Take 1 tablet by mouth daily.    Marland Kitchen. denosumab (PROLIA) 60 MG/ML SOLN  injection Inject 60 mg into the skin every 6 (six) months. Administer in upper arm, thigh, or abdomen 180 mL 0  . GARLIC PO Take 1 capsule by mouth daily.    . Lactobacillus-Inulin (CULTURELLE DIGESTIVE HEALTH) CAPS Take 1 capsule by mouth daily. 14 capsule 0  . Magnesium 250 MG TABS Take 1 tablet by mouth daily.    . Melatonin 5 MG TABS Take 1 tablet by mouth as needed.    . Misc Natural Products (OSTEO BI-FLEX TRIPLE STRENGTH) TABS Take 1 tablet by mouth daily.    . Multiple Vitamins-Minerals (CENTRUM SILVER PO) Take 1 tablet by mouth daily.    Marland Kitchen. OVER THE COUNTER MEDICATION Hair Volume - 1 tab by mouth daily.    Marland Kitchen. VALERIAN ROOT PO Take 1 capsule by mouth as needed.    . vitamin B-12 (CYANOCOBALAMIN) 1000 MCG tablet Take 5,000 mcg by mouth daily.     . vitamin C (ASCORBIC ACID) 500 MG tablet Take 500 mg by mouth daily.    Marland Kitchen. zinc gluconate 50 MG tablet Take 50 mg by mouth daily.    . Tdap (BOOSTRIX) 5-2.5-18.5 LF-MCG/0.5 injection Inject 0.5 mLs into the muscle once. 0.5 mL 0   No facility-administered medications prior to  visit.     Review of Systems;  Patient denies headache, fevers, malaise, unintentional weight loss, skin rash, eye pain, sinus congestion and sinus pain, sore throat, dysphagia,  hemoptysis , cough, dyspnea, wheezing, chest pain, palpitations, orthopnea, edema, abdominal pain, nausea, melena, diarrhea, constipation, flank pain, dysuria, hematuria, urinary  Frequency, nocturia, numbness, tingling, seizures,  Focal weakness, Loss of consciousness,  Tremor, insomnia, depression, anxiety, and suicidal ideation.      Objective:  BP 118/62   Pulse 73   Temp 98.4 F (36.9 C)   Resp 16   Wt 109 lb (49.4 kg)   BMI 24.44 kg/m   BP Readings from Last 3 Encounters:  09/29/15 118/62  05/02/15 122/88  01/13/15 126/78    Wt Readings from Last 3 Encounters:  09/29/15 109 lb (49.4 kg)  05/02/15 113 lb 2 oz (51.3 kg)  01/13/15 108 lb 8 oz (49.2 kg)    General appearance:  alert, cooperative and appears stated age Ears: normal TM's and external ear canals both ears Throat: lips, mucosa, and tongue normal; teeth and gums normal Neck: no adenopathy, no carotid bruit, supple, symmetrical, trachea midline and thyroid not enlarged, symmetric, no tenderness/mass/nodules Back: symmetric, no curvature. ROM normal. No CVA tenderness. Lungs: clear to auscultation bilaterally Heart: regular rate and rhythm, S1, S2 normal, no murmur, click, rub or gallop Abdomen: soft, non-tender; bowel sounds normal; no masses,  no organomegaly Pulses: 2+ and symmetric Skin: Skin color, texture, turgor normal. No rashes or lesions Lymph nodes: Cervical, supraclavicular, and axillary nodes normal.  No results found for: HGBA1C  Lab Results  Component Value Date   CREATININE 0.70 12/30/2014   CREATININE 0.8 12/28/2013   CREATININE 0.7 08/15/2012    Lab Results  Component Value Date   WBC 6.4 12/30/2014   HGB 12.6 12/30/2014   HCT 37.5 12/30/2014   PLT 233.0 12/30/2014   GLUCOSE 85 12/30/2014   CHOL 184 12/30/2014   TRIG 92.0 12/30/2014   HDL 57.90 12/30/2014   LDLDIRECT 133.6 12/28/2013   LDLCALC 108 (H) 12/30/2014   ALT 15 12/30/2014   AST 19 12/30/2014   NA 144 12/30/2014   K 4.4 12/30/2014   CL 109 12/30/2014   CREATININE 0.70 12/30/2014   BUN 24 (H) 12/30/2014   CO2 29 12/30/2014   TSH 1.12 12/28/2013    Mm Diag Breast Tomo Uni Left  Result Date: 11/01/2014 CLINICAL DATA:  Screening recall for a left breast asymmetry seen on the MLO view only. EXAM: DIGITAL DIAGNOSTIC LEFT MAMMOGRAM WITH 3D TOMOSYNTHESIS AND CAD COMPARISON:  Previous exam(s). ACR Breast Density Category c: The breast tissue is heterogeneously dense, which may obscure small masses. FINDINGS: Cc and MLO tomosynthesis was performed of the left breast. The initially questioned left breast asymmetry resolves on the additional imaging with findings compatible with overlapping tissue. There is no  mammographic evidence of malignancy in the left breast. Mammographic images were processed with CAD. IMPRESSION: Initially questioned left breast asymmetry resolves on the additional imaging compatible with overlapping tissue. There is no mammographic evidence of malignancy in the left breast. RECOMMENDATION: Screening mammogram in one year.(Code:SM-B-01Y) I have discussed the findings and recommendations with the patient. Results were also provided in writing at the conclusion of the visit. If applicable, a reminder letter will be sent to the patient regarding the next appointment. BI-RADS CATEGORY  1: Negative. Electronically Signed   By: Edwin CapJennifer  Jarosz M.D.   On: 11/01/2014 09:34    Assessment & Plan:   Problem  List Items Addressed This Visit    Hyperlipidemia with target LDL less than 130    LDL and triglycerides have been acceptable on current diet, she is exercising regularly and liver enzymes are normal.  Lab Results  Component Value Date   CHOL 184 12/30/2014   HDL 57.90 12/30/2014   LDLCALC 108 (H) 12/30/2014   LDLDIRECT 133.6 12/28/2013   TRIG 92.0 12/30/2014   CHOLHDL 3 12/30/2014  '        Osteoporosis, postmenopausal    T scores have been stable for over 5 years with no history of fragility fractures on Boniva.  She does not want to consider change in therapy with  Prolia.   Lab Results  Component Value Date   CREATININE 0.70 12/30/2014  '        Gingival pain    Exam is normal.  Reassurance provided      Anxiety state    Patient is generally very healthy but has a fear of falling that has become nearly obsessive.  Spent 15 minutes discussing her condition and anxiety and encouraged her to resume normal lifestyle.  She is not interested in taking medication or seeing Psychology        Other Visit Diagnoses   None.     I am having Ms. Fiser maintain her Multiple Vitamins-Minerals (CENTRUM SILVER PO), GARLIC PO, OSTEO BI-FLEX TRIPLE STRENGTH, Vitamin D3,  vitamin C, vitamin B-12, aspirin, VALERIAN ROOT PO, Melatonin, CULTURELLE DIGESTIVE HEALTH, calcium carbonate, Magnesium, zinc gluconate, Biotin, Coenzyme Q10, OVER THE COUNTER MEDICATION, Tdap, and denosumab.  No orders of the defined types were placed in this encounter.   There are no discontinued medications.  Follow-up: No Follow-up on file.   Sherlene Shams, MD

## 2015-09-29 NOTE — Patient Instructions (Addendum)
Please call us back with the name of  The cream that Dr Roseanne KaufmanIsenstein prescribed for your rash    We will get your   Mammogram Bone Density Test Colonoscopy  Set up in the new few months

## 2015-10-01 DIAGNOSIS — F411 Generalized anxiety disorder: Secondary | ICD-10-CM | POA: Insufficient documentation

## 2015-10-01 DIAGNOSIS — F32A Depression, unspecified: Secondary | ICD-10-CM | POA: Insufficient documentation

## 2015-10-01 NOTE — Assessment & Plan Note (Signed)
Patient is generally very healthy but has a fear of falling that has become nearly obsessive.  Spent 15 minutes discussing her condition and anxiety and encouraged her to resume normal lifestyle.  She is not interested in taking medication or seeing Psychology

## 2015-10-01 NOTE — Assessment & Plan Note (Signed)
Exam is normal.  Reassurance provided.  

## 2015-10-01 NOTE — Assessment & Plan Note (Signed)
LDL and triglycerides have been acceptable on current diet, she is exercising regularly and liver enzymes are normal.  Lab Results  Component Value Date   CHOL 184 12/30/2014   HDL 57.90 12/30/2014   LDLCALC 108 (H) 12/30/2014   LDLDIRECT 133.6 12/28/2013   TRIG 92.0 12/30/2014   CHOLHDL 3 12/30/2014  '

## 2015-10-01 NOTE — Assessment & Plan Note (Signed)
T scores have been stable for over 5 years with no history of fragility fractures on Boniva.  She does not want to consider change in therapy with  Prolia.   Lab Results  Component Value Date   CREATININE 0.70 12/30/2014  '

## 2015-10-06 ENCOUNTER — Ambulatory Visit: Payer: Medicare Other

## 2015-10-06 ENCOUNTER — Ambulatory Visit: Payer: Self-pay | Admitting: General Surgery

## 2015-10-07 ENCOUNTER — Ambulatory Visit (INDEPENDENT_AMBULATORY_CARE_PROVIDER_SITE_OTHER): Payer: Medicare Other | Admitting: Surgical

## 2015-10-07 DIAGNOSIS — M810 Age-related osteoporosis without current pathological fracture: Secondary | ICD-10-CM | POA: Diagnosis not present

## 2015-10-07 MED ORDER — DENOSUMAB 60 MG/ML ~~LOC~~ SOLN
60.0000 mg | Freq: Once | SUBCUTANEOUS | Status: AC
  Administered 2015-10-07: 60 mg via SUBCUTANEOUS

## 2015-10-07 NOTE — Progress Notes (Signed)
Patient comes in today for Prolia injection. Injection given in right arm subcutaneous. Patient tolerated well.

## 2015-10-07 NOTE — Telephone Encounter (Signed)
I have electronically submitted pt's info for Prolia insurance verification and will notify you once I have a response. Thank you. °

## 2015-10-07 NOTE — Telephone Encounter (Signed)
Shot received today, thanks

## 2015-10-10 NOTE — Progress Notes (Signed)
  I have reviewed the above information and agree with above.   Viraj Liby, MD 

## 2015-10-15 ENCOUNTER — Ambulatory Visit: Payer: Medicare Other | Admitting: Internal Medicine

## 2015-10-29 ENCOUNTER — Encounter: Payer: Self-pay | Admitting: *Deleted

## 2015-11-05 ENCOUNTER — Ambulatory Visit (INDEPENDENT_AMBULATORY_CARE_PROVIDER_SITE_OTHER): Payer: Medicare Other | Admitting: General Surgery

## 2015-11-05 ENCOUNTER — Encounter: Payer: Self-pay | Admitting: General Surgery

## 2015-11-05 VITALS — BP 130/70 | HR 74 | Resp 12 | Ht <= 58 in | Wt 109.0 lb

## 2015-11-05 DIAGNOSIS — Z86010 Personal history of colonic polyps: Secondary | ICD-10-CM

## 2015-11-05 MED ORDER — POLYETHYLENE GLYCOL 3350 17 GM/SCOOP PO POWD: 1.0000 | Freq: Once | ORAL | 0 refills | Status: AC

## 2015-11-05 MED ORDER — METOCLOPRAMIDE HCL 10 MG PO TABS: 10.0000 mg | ORAL_TABLET | ORAL | 0 refills | Status: DC

## 2015-11-05 NOTE — Progress Notes (Signed)
Patient ID: Natalie Coleman, female   DOB: 03/18/1940, 75 y.o.   MRN: [id number]  Chief Complaint  Patient presents with  . Colonoscopy    HPI Natalie Coleman is a 75 y.o. female here today for a colonoscopy . Last colonoscopy completed in [place] was done in 2009 by Barnetta ChapelMartin Skulskie, M.D.No GI problem at this time.  The best I can tell, the patient had a study at a [place] facility after 2009. At the time of her 2015 visit she reported she would bring in these rectors. This did not occur, nor did she notify us of which office she was seen and treated at. A new records request has been obtained and the patient again says that she'll notify us of where this last colonoscopy was completed.  The patient reports she has significant nausea vomiting with the prep. She also reports that she was "crazy" after sedation.  The patient also stated that she occasionally "leaks" clear liquid from her rectum.  HPI  Past Medical History:  Diagnosis Date  . Colon polyps   . GERD (gastroesophageal reflux disease)   . Hemorrhoids   . History of blood transfusion     Past Surgical History:  Procedure Laterality Date  . CESAREAN SECTION  [id number]  . CHOLECYSTECTOMY  2008  . COLONOSCOPY  2009  . EYE SURGERY Right   . FRACTURE SURGERY     right tibia  . ORIF ANKLE FRACTURE BIMALLEOLAR  March 2006  . ORIF TIBIA & FIBULA FRACTURES  March 2006    Family History  Problem Relation Age of Onset  . Hypertension Father   . Cancer Neg Hx   . Hypertension Mother   . Hypertension Sister     Social History Social History  Substance Use Topics  . Smoking status: Never Smoker  . Smokeless tobacco: Never Used  . Alcohol use 0.6 oz/week    1 Glasses of wine per week     Comment: ocassional    Allergies  Allergen Reactions  . Dexamethasone     Excessive jitteriness , confusion     Current Outpatient Prescriptions  Medication Sig Dispense Refill  . aspirin 81 MG tablet Take 81 mg by  mouth daily.    . Biotin 5000 MCG CAPS Take 1 capsule by mouth daily.    . calcium carbonate 200 MG capsule Take 250 mg by mouth 2 (two) times daily with a meal. Patient taking 2 tablet three times daily    . Cholecalciferol (VITAMIN D3) 1000 UNITS CAPS Take 2,000 Units by mouth daily.     . Coenzyme Q10 100 MG TABS Take 1 tablet by mouth daily.    Marland Kitchen. denosumab (PROLIA) 60 MG/ML SOLN injection Inject 60 mg into the skin every 6 (six) months. Administer in upper arm, thigh, or abdomen    . GARLIC PO Take 1 capsule by mouth daily.    . Lactobacillus-Inulin (CULTURELLE DIGESTIVE HEALTH) CAPS Take 1 capsule by mouth daily. 14 capsule 0  . Magnesium 250 MG TABS Take 1 tablet by mouth daily.    . Melatonin 5 MG TABS Take 1 tablet by mouth as needed.    . Multiple Vitamins-Minerals (CENTRUM SILVER PO) Take 1 tablet by mouth daily.    . Tdap (BOOSTRIX) 5-2.5-18.5 LF-MCG/0.5 injection Inject 0.5 mLs into the muscle once. 0.5 mL 0  . VALERIAN ROOT PO Take 1 capsule by mouth as needed.    . vitamin B-12 (CYANOCOBALAMIN) 1000 MCG tablet Take 5,000 mcg by  mouth daily.     . vitamin C (ASCORBIC ACID) 500 MG tablet Take 500 mg by mouth daily.    Marland Kitchen zinc gluconate 50 MG tablet Take 50 mg by mouth daily.    . metoCLOPramide (REGLAN) 10 MG tablet Take 1 tablet (10 mg total) by mouth as directed. Take 1 tab 30 min prior to prep and one as needed. 2 tablet 0   No current facility-administered medications for this visit.     Review of Systems Review of Systems  Constitutional: Negative.   Respiratory: Negative.   Cardiovascular: Negative.     Blood pressure 130/70, pulse 74, resp. rate 12, height 4\' 9"  (1.448 m), weight 109 lb (49.4 kg).  Physical Exam Physical Exam  Constitutional: She is oriented to person, place, and time. She appears well-developed and well-nourished.  Eyes: Conjunctivae are normal. No scleral icterus.  Neck: Neck supple.  Cardiovascular: Normal rate, regular rhythm and normal heart  sounds.   Pulmonary/Chest: Effort normal and breath sounds normal.  Abdominal: Soft. Normal appearance and bowel sounds are normal. There is no hepatomegaly. There is no tenderness.  Neurological: She is alert and oriented to person, place, and time.  Skin: Skin is warm and dry.    Data Reviewed 2009 endoscopy report and pathology.  Assessment    Adenomatous polyp of the colon in 2009, incomplete study and the net the transverse colon.  Report of intervening colonoscopy in [place].    Plan    On sig in the patient has been asked to bring any information regarding her [facility] procedure so that records can be obtained. We'll go ahead and arrange for a colonoscopy in the near future.     Colonoscopy with possible biopsy/polypectomy prn: Information regarding the procedure, including its potential risks and complications (including but not limited to perforation of the bowel, which may require emergency surgery to repair, and bleeding) was verbally given to the patient. Educational information regarding lower intestinal endoscopy was given to the patient. Written instructions for how to complete the bowel prep using Miralax were provided. The importance of drinking ample fluids to avoid dehydration as a result of the prep emphasized.  The patient is scheduled for a Colonoscopy at [facility] on 12/10/15. They are aware to call the day before to get their arrival time. Miralax prescription has been sent into the patient's pharmacy. The patient is aware of date and instructions.    Reglan may be of benefit to minimize nausea. The patient has been instructed to make use of 10 mg 30 minutes prior to the prep and maybe repeat in 4 hours if needed.  This information has been scribed by Ples Specter CMA.   Earline Mayotte 11/06/2015, 7:45 AM

## 2015-11-05 NOTE — Patient Instructions (Addendum)
Colonoscopy A colonoscopy is an exam to look at the entire large intestine (colon). This exam can help find problems such as tumors, polyps, inflammation, and areas of bleeding. The exam takes about 1 hour.  LET [facility] PROVIDER KNOW ABOUT:   Any allergies you have.  All medicines you are taking, including vitamins, herbs, eye drops, creams, and over-the-counter medicines.  Previous problems you or members of your family have had with the use of anesthetics.  Any blood disorders you have.  Previous surgeries you have had.  Medical conditions you have. RISKS AND COMPLICATIONS  Generally, this is a safe procedure. However, as with any procedure, complications can occur. Possible complications include:  Bleeding.  Tearing or rupture of the colon wall.  Reaction to medicines given during the exam.  Infection (rare). BEFORE THE PROCEDURE   Ask your health care provider about changing or stopping your regular medicines.  You may be prescribed an oral bowel prep. This involves drinking a large amount of medicated liquid, starting the day before your procedure. The liquid will cause you to have multiple loose stools until your stool is almost clear or light green. This cleans out your colon in preparation for the procedure.  Do not eat or drink anything else once you have started the bowel prep, unless your health care provider tells you it is safe to do so.  Arrange for someone to drive you home after the procedure. PROCEDURE   You will be given medicine to help you relax (sedative).  You will lie on your side with your knees bent.  A long, flexible tube with a light and camera on the end (colonoscope) will be inserted through the rectum and into the colon. The camera sends video back to a computer screen as it moves through the colon. The colonoscope also releases carbon dioxide gas to inflate the colon. This helps your health care provider see the area better.  During  the exam, your health care provider may take a small tissue sample (biopsy) to be examined under a microscope if any abnormalities are found.  The exam is finished when the entire colon has been viewed. AFTER THE PROCEDURE   Do not drive for 24 hours after the exam.  You may have a small amount of blood in your stool.  You may pass moderate amounts of gas and have mild abdominal cramping or bloating. This is caused by the gas used to inflate your colon during the exam.  Ask when your test results will be ready and how you will get your results. Make sure you get your test results.   This information is not intended to replace advice given to you by your health care provider. Make sure you discuss any questions you have with your health care provider.   Document Released: 01/30/2000 Document Revised: 11/22/2012 Document Reviewed: 10/09/2012 Elsevier Interactive Patient Education [employer]  The patient is scheduled for a Colon[facility] on 12/10/15. They are aware to call the day before to get their arrival time. Miralax prescription has been sent into the patient's pharmacy. The patient is aware of date and instructions.

## 2015-11-14 ENCOUNTER — Other Ambulatory Visit: Payer: Self-pay | Admitting: General Surgery

## 2015-11-14 DIAGNOSIS — Z86010 Personal history of colonic polyps: Secondary | ICD-10-CM

## 2015-11-25 ENCOUNTER — Encounter: Payer: Self-pay | Admitting: General Surgery

## 2015-11-25 NOTE — Progress Notes (Signed)
Records from the August 2011 evaluation with Bernette Redbirdobert Buccini, M.D. were reviewed. Patient seen in follow-up of a colonoscopy, incomplete, in 2009 [place] and with reports of a 5 mm distal sigmoid polyp being removed. No report on the pathology.  10/31/2009 colonoscopy report reviewed. Normal to the terminal ileum with the exception of a few smallmouth diverticuli. Five-year follow-up recommended based on original history of colonic polyps.

## 2015-11-27 ENCOUNTER — Encounter (INDEPENDENT_AMBULATORY_CARE_PROVIDER_SITE_OTHER): Payer: Self-pay

## 2015-11-27 ENCOUNTER — Encounter: Payer: Self-pay | Admitting: Internal Medicine

## 2015-11-27 ENCOUNTER — Telehealth: Payer: Self-pay | Admitting: *Deleted

## 2015-11-27 ENCOUNTER — Ambulatory Visit (INDEPENDENT_AMBULATORY_CARE_PROVIDER_SITE_OTHER): Payer: Medicare Other | Admitting: Internal Medicine

## 2015-11-27 VITALS — BP 138/78 | HR 75 | Temp 98.4°F | Resp 12 | Ht <= 58 in | Wt 109.5 lb

## 2015-11-27 DIAGNOSIS — Z1231 Encounter for screening mammogram for malignant neoplasm of breast: Secondary | ICD-10-CM

## 2015-11-27 DIAGNOSIS — F411 Generalized anxiety disorder: Secondary | ICD-10-CM

## 2015-11-27 DIAGNOSIS — M810 Age-related osteoporosis without current pathological fracture: Secondary | ICD-10-CM | POA: Diagnosis not present

## 2015-11-27 DIAGNOSIS — Z1239 Encounter for other screening for malignant neoplasm of breast: Secondary | ICD-10-CM

## 2015-11-27 MED ORDER — AMMONIUM LACTATE 12 % EX LOTN: 1.0000 "application " | TOPICAL_LOTION | CUTANEOUS | 0 refills | Status: DC | PRN

## 2015-11-27 MED ORDER — DICLOFENAC SODIUM 1 % TD GEL: 2.0000 g | Freq: Four times a day (QID) | TRANSDERMAL | 11 refills | Status: DC

## 2015-11-27 NOTE — Telephone Encounter (Signed)
Pt requested a call to discuss her Rx that she received today  Pt contact [phone]

## 2015-11-27 NOTE — Progress Notes (Signed)
Subjective:  Patient ID: Natalie Coleman, female    DOB: [birth year]  Age: 75 y.o. MRN: [id number]  CC: The primary encounter diagnosis was Breast cancer screening. Diagnoses of Osteoporosis without current pathological fracture, unspecified osteoporosis type, Anxiety state, and Osteoporosis, postmenopausal were also pertinent to this visit.  HPI [name] presents for multiple complaints , but most prominent complaint is of feeling weak and tired.   happening daily,  Finds it necessary to lie down and take up a nap.  Wakes up in the afternoon confused for a few minutes about the time of day . Takes a few minutes to reorient  Herself.   Has been sleeping well for the last two nights because her granddaughter is visiting her and sleeps in the same bed.  Thinks it is the Prolia since last dose was august 22.   the symptoms have been present for the entire month of Prolia.   Needs DEXA and mammogram ordered  Granddaughter has been observing behavior that suggests anxiety, no memory deficits or disorientation. Long discussion about anxiety .  Patient lives alone  No family in the immediate local.  Has continually deferred medication .     Outpatient Medications Prior to Visit  Medication Sig Dispense Refill  . aspirin 81 MG tablet Take 81 mg by mouth daily.    . Biotin 5000 MCG CAPS Take 1 capsule by mouth daily.    . calcium carbonate 200 MG capsule Take 250 mg by mouth 2 (two) times daily with a meal. Patient taking 2 tablet three times daily    . Cholecalciferol (VITAMIN D3) 1000 UNITS CAPS Take 2,000 Units by mouth daily.     . Coenzyme Q10 100 MG TABS Take 1 tablet by mouth daily.    Marland Kitchen denosumab (PROLIA) 60 MG/ML SOLN injection Inject 60 mg into the skin every 6 (six) months. Administer in upper arm, thigh, or abdomen    . GARLIC PO Take 1 capsule by mouth daily.    . Lactobacillus-Inulin (CULTURELLE DIGESTIVE HEALTH) CAPS Take 1 capsule by mouth daily. 14 capsule 0  .  Magnesium 250 MG TABS Take 1 tablet by mouth daily.    . Melatonin 5 MG TABS Take 1 tablet by mouth as needed.    . metoCLOPramide (REGLAN) 10 MG tablet Take 1 tablet (10 mg total) by mouth as directed. Take 1 tab 30 min prior to prep and one as needed. 2 tablet 0  . Multiple Vitamins-Minerals (CENTRUM SILVER PO) Take 1 tablet by mouth daily.    . Tdap (BOOSTRIX) 5-2.5-18.5 LF-MCG/0.5 injection Inject 0.5 mLs into the muscle once. 0.5 mL 0  . VALERIAN ROOT PO Take 1 capsule by mouth as needed.    . vitamin B-12 (CYANOCOBALAMIN) 1000 MCG tablet Take 5,000 mcg by mouth daily.     . vitamin C (ASCORBIC ACID) 500 MG tablet Take 500 mg by mouth daily.    Marland Kitchen zinc gluconate 50 MG tablet Take 50 mg by mouth daily.     No facility-administered medications prior to visit.     Review of Systems;  Patient denies headache, fevers, malaise, unintentional weight loss, skin rash, eye pain, sinus congestion and sinus pain, sore throat, dysphagia,  hemoptysis , cough, dyspnea, wheezing, chest pain, palpitations, orthopnea, edema, abdominal pain, nausea, melena, diarrhea, constipation, flank pain, dysuria, hematuria, urinary  Frequency, nocturia, numbness, tingling, seizures,  Focal weakness, Loss of consciousness,  Tremor, insomnia, depression, anxiety, and suicidal ideation.  Objective:  BP 138/78   Pulse 75   Temp 98.4 F (36.9 C) (Oral)   Resp 12   Ht 4\' 8"  (1.422 m)   Wt 109 lb 8 oz (49.7 kg)   SpO2 97%   BMI 24.55 kg/m   BP Readings from Last 3 Encounters:  11/27/15 138/78  11/05/15 130/70  09/29/15 118/62    Wt Readings from Last 3 Encounters:  11/27/15 109 lb 8 oz (49.7 kg)  11/05/15 109 lb (49.4 kg)  09/29/15 109 lb (49.4 kg)    General appearance: alert, cooperative and appears stated age Ears: normal TM's and external ear canals both ears Throat: lips, mucosa, and tongue normal; teeth and gums normal Neck: no adenopathy, no carotid bruit, supple, symmetrical, trachea midline  and thyroid not enlarged, symmetric, no tenderness/mass/nodules Back: symmetric, no curvature. ROM normal. No CVA tenderness. Lungs: clear to auscultation bilaterally Heart: regular rate and rhythm, S1, S2 normal, no murmur, click, rub or gallop Abdomen: soft, non-tender; bowel sounds normal; no masses,  no organomegaly Pulses: 2+ and symmetric Skin: Skin color, texture, turgor normal. No rashes or lesions Lymph nodes: Cervical, supraclavicular, and axillary nodes normal.  No results found for: HGBA1C  Lab Results  Component Value Date   CREATININE 0.70 12/30/2014   CREATININE 0.8 12/28/2013   CREATININE 0.7 08/15/2012    Lab Results  Component Value Date   WBC 6.4 12/30/2014   HGB 12.6 12/30/2014   HCT 37.5 12/30/2014   PLT 233.0 12/30/2014   GLUCOSE 85 12/30/2014   CHOL 184 12/30/2014   TRIG 92.0 12/30/2014   HDL 57.90 12/30/2014   LDLDIRECT 133.6 12/28/2013   LDLCALC 108 (H) 12/30/2014   ALT 15 12/30/2014   AST 19 12/30/2014   NA 144 12/30/2014   K 4.4 12/30/2014   CL 109 12/30/2014   CREATININE 0.70 12/30/2014   BUN 24 (H) 12/30/2014   CO2 29 12/30/2014   TSH 1.12 12/28/2013    Mm Diag Breast Tomo Uni Left  Result Date: 11/01/2014 CLINICAL DATA:  Screening recall for a left breast asymmetry seen on the MLO view only. EXAM: DIGITAL DIAGNOSTIC LEFT MAMMOGRAM WITH 3D TOMOSYNTHESIS AND CAD COMPARISON:  Previous exam(s). ACR Breast Density Category c: The breast tissue is heterogeneously dense, which may obscure small masses. FINDINGS: Cc and MLO tomosynthesis was performed of the left breast. The initially questioned left breast asymmetry resolves on the additional imaging with findings compatible with overlapping tissue. There is no mammographic evidence of malignancy in the left breast. Mammographic images were processed with CAD. IMPRESSION: Initially questioned left breast asymmetry resolves on the additional imaging compatible with overlapping tissue. There is no  mammographic evidence of malignancy in the left breast. RECOMMENDATION: Screening mammogram in one year.(Code:[id number]-B-01Y) I have discussed the findings and recommendations with the patient. Results were also provided in writing at the conclusion of the visit. If applicable, a reminder letter will be sent to the patient regarding the next appointment. BI-RADS CATEGORY  1: Negative. Electronically Signed   By: Edwin Cap M.D.   On: 11/01/2014 09:34    Assessment & Plan:   Problem List Items Addressed This Visit    Osteoporosis, postmenopausal    Now managed with Prolia, first dose given in August.  DEXA has not been done since 2015.  Follow up DEXA for baseline T scores has been ordered.       Anxiety state    Patient is generally very healthy but has a fear of falling that  has become nearly obsessive.  Spent greater than 50% of a 25 minute visit addressing her anxiety and encouraged her to resume normal lifestyle.  She is not interested in taking medication or seeing Psychology        Other Visit Diagnoses    Breast cancer screening    -  Primary   Relevant Orders   MM DIGITAL SCREENING BILATERAL   Osteoporosis without current pathological fracture, unspecified osteoporosis type       Relevant Orders   DG Bone Density      I am having Ms. Salm start on diclofenac sodium. I am also having her maintain her Multiple Vitamins-Minerals (CENTRUM SILVER PO), GARLIC PO, Vitamin D3, vitamin C, vitamin B-12, aspirin, VALERIAN ROOT PO, Melatonin, CULTURELLE DIGESTIVE HEALTH, calcium carbonate, Magnesium, zinc gluconate, Biotin, Coenzyme Q10, Tdap, denosumab, and metoCLOPramide.  Meds ordered this encounter  Medications  . diclofenac sodium (VOLTAREN) 1 % GEL    Sig: Apply 2 g topically 4 (four) times daily.    Dispense:  100 g    Refill:  11    There are no discontinued medications.  Follow-up: No Follow-up on file.   Sherlene ShamsULLO, Andi Layfield L, MD

## 2015-11-27 NOTE — Telephone Encounter (Signed)
Yes, it has been sent

## 2015-11-27 NOTE — Telephone Encounter (Signed)
Noted, thanks!

## 2015-11-27 NOTE — Telephone Encounter (Signed)
Spoke with the patient she is not able to afford the Voltren gel that was ordered.  She wanted to know if she can get ammonium lac 12% cream, a generic for lactic acid cream that Babs Sciaralara Brooks a dermatologist had prescribed prior sent to the pharmacy?

## 2015-11-27 NOTE — Patient Instructions (Addendum)
Please address your anxiety and your fears with God's commandments and promises!  Bring your BP machine back with you for a nurse visit to check its accuracy   We have ordered your mammogram and bone density test  I have refilled your Voltaren gel

## 2015-11-27 NOTE — Telephone Encounter (Signed)
Attempted to reach patient, left a VM to return my call  

## 2015-11-27 NOTE — Progress Notes (Signed)
Pre-visit discussion using our clinic review tool. No additional management support is needed unless otherwise documented below in the visit note.  

## 2015-11-27 NOTE — Telephone Encounter (Signed)
Notified patient.

## 2015-11-30 ENCOUNTER — Encounter: Payer: Self-pay | Admitting: Internal Medicine

## 2015-11-30 NOTE — Assessment & Plan Note (Signed)
Patient is generally very healthy but has a fear of falling that has become nearly obsessive.  Spent greater than 50% of a 25 minute visit addressing her anxiety and encouraged her to resume normal lifestyle.  She is not interested in taking medication or seeing Psychology

## 2015-11-30 NOTE — Assessment & Plan Note (Signed)
Now managed with Prolia, first dose given in August.  DEXA has not been done since 2015.  Follow up DEXA for baseline T scores has been ordered.

## 2015-12-03 ENCOUNTER — Encounter: Payer: Self-pay | Admitting: *Deleted

## 2015-12-03 NOTE — Progress Notes (Signed)
Patient was contacted today and confirms no medication changes since last office visit. She reports that she has picked up Miralax prescription.   [facility] will [facility].   This patient was instructed to call the office should she have further questions.

## 2015-12-10 ENCOUNTER — Encounter: Payer: Self-pay | Admitting: *Deleted

## 2015-12-10 ENCOUNTER — Ambulatory Visit
Admission: RE | Admit: 2015-12-10 | Discharge: 2015-12-10 | Disposition: A | Discharge status: Home / Self Care | Payer: Medicare Other | Source: Ambulatory Visit | Attending: General Surgery | Admitting: General Surgery

## 2015-12-10 ENCOUNTER — Encounter
Admission: RE | Disposition: A | Discharge status: Home / Self Care | Payer: Self-pay | Source: Ambulatory Visit | Attending: General Surgery

## 2015-12-10 ENCOUNTER — Ambulatory Visit: Payer: Medicare Other | Admitting: Registered Nurse

## 2015-12-10 DIAGNOSIS — Z86010 Personal history of colonic polyps: Secondary | ICD-10-CM | POA: Insufficient documentation

## 2015-12-10 DIAGNOSIS — Z79899 Other long term (current) drug therapy: Secondary | ICD-10-CM | POA: Diagnosis not present

## 2015-12-10 DIAGNOSIS — Z7982 Long term (current) use of aspirin: Secondary | ICD-10-CM | POA: Diagnosis not present

## 2015-12-10 DIAGNOSIS — K219 Gastro-esophageal reflux disease without esophagitis: Secondary | ICD-10-CM | POA: Diagnosis not present

## 2015-12-10 DIAGNOSIS — M810 Age-related osteoporosis without current pathological fracture: Secondary | ICD-10-CM | POA: Insufficient documentation

## 2015-12-10 DIAGNOSIS — F419 Anxiety disorder, unspecified: Secondary | ICD-10-CM | POA: Insufficient documentation

## 2015-12-10 DIAGNOSIS — Z1211 Encounter for screening for malignant neoplasm of colon: Secondary | ICD-10-CM | POA: Diagnosis present

## 2015-12-10 HISTORY — DX: Age-related osteoporosis without current pathological fracture: M81.0

## 2015-12-10 HISTORY — PX: COLONOSCOPY WITH PROPOFOL: SHX5780

## 2015-12-10 SURGERY — COLONOSCOPY WITH PROPOFOL
Anesthesia: General

## 2015-12-10 MED ORDER — SODIUM CHLORIDE 0.9 % IV SOLN
INTRAVENOUS | Status: DC
  Administered 2015-12-10: 1000 mL via INTRAVENOUS

## 2015-12-10 MED ORDER — FENTANYL CITRATE (PF) 100 MCG/2ML IJ SOLN
INTRAMUSCULAR | Status: DC | PRN
  Administered 2015-12-10: 50 ug via INTRAVENOUS

## 2015-12-10 MED ORDER — LIDOCAINE HCL (CARDIAC) 20 MG/ML IV SOLN
INTRAVENOUS | Status: DC | PRN
  Administered 2015-12-10: 40 mg via INTRAVENOUS

## 2015-12-10 MED ORDER — ONDANSETRON HCL 4 MG/2ML IJ SOLN
INTRAMUSCULAR | Status: AC
  Administered 2015-12-10: 4 mg via INTRAVENOUS
  Filled 2015-12-10: qty 2

## 2015-12-10 MED ORDER — PROPOFOL 500 MG/50ML IV EMUL
INTRAVENOUS | Status: DC | PRN
  Administered 2015-12-10: 150 ug/kg/min via INTRAVENOUS

## 2015-12-10 MED ORDER — ONDANSETRON HCL 4 MG/2ML IJ SOLN
4.0000 mg | Freq: Once | INTRAMUSCULAR | Status: AC
  Administered 2015-12-10: 4 mg via INTRAVENOUS

## 2015-12-10 MED ORDER — EPHEDRINE SULFATE 50 MG/ML IJ SOLN
INTRAMUSCULAR | Status: DC | PRN
Start: 2015-12-10 — End: 2015-12-10
  Administered 2015-12-10: 5 mg via INTRAVENOUS

## 2015-12-10 MED ORDER — MIDAZOLAM HCL 2 MG/2ML IJ SOLN
INTRAMUSCULAR | Status: DC | PRN
  Administered 2015-12-10: 2 mg via INTRAVENOUS

## 2015-12-10 MED ORDER — PROPOFOL 10 MG/ML IV BOLUS
INTRAVENOUS | Status: DC | PRN
  Administered 2015-12-10: 30 mg via INTRAVENOUS

## 2015-12-10 NOTE — Anesthesia Preprocedure Evaluation (Signed)
Anesthesia Evaluation  Patient identified by MRN, date of birth, ID band Patient awake    Reviewed: Allergy & Precautions, NPO status , Patient's Chart, lab work & pertinent test results  Airway Mallampati: II       Dental  (+) Teeth Intact   Pulmonary neg pulmonary ROS,    breath sounds clear to auscultation       Cardiovascular Exercise Tolerance: Good  Rhythm:Regular     Neuro/Psych Anxiety negative neurological ROS     GI/Hepatic Neg liver ROS, GERD  Medicated,  Endo/Other  negative endocrine ROS  Renal/GU negative Renal ROS     Musculoskeletal   Abdominal Normal abdominal exam  (+)   Peds  Hematology negative hematology ROS (+)   Anesthesia Other Findings   Reproductive/Obstetrics                             Anesthesia Physical Anesthesia Plan  ASA: II  Anesthesia Plan: General   Post-op Pain Management:    Induction: Intravenous  Airway Management Planned: Natural Airway and Nasal Cannula  Additional Equipment:   Intra-op Plan:   Post-operative Plan:   Informed Consent: I have reviewed the patients History and Physical, chart, labs and discussed the procedure including the risks, benefits and alternatives for the proposed anesthesia with the patient or authorized representative who has indicated his/her understanding and acceptance.     Plan Discussed with: CRNA  Anesthesia Plan Comments:         Anesthesia Quick Evaluation

## 2015-12-10 NOTE — Anesthesia Postprocedure Evaluation (Signed)
Anesthesia Post Note  Patient: Natalie Coleman  Procedure(s) Performed: Procedure(s) (LRB): COLONOSCOPY WITH PROPOFOL (N/A)  Patient location during evaluation: PACU Anesthesia Type: General Level of consciousness: awake Pain management: pain level controlled Vital Signs Assessment: post-procedure vital signs reviewed and stable Respiratory status: spontaneous breathing Cardiovascular status: stable Anesthetic complications: no    Last Vitals:  Vitals:   12/10/15 1043 12/10/15 1050  BP: 134/77 132/73  Pulse:    Resp: 10   Temp: 36.1 C     Last Pain:  Vitals:   12/10/15 1043  TempSrc: Tympanic                 VAN STAVEREN,Demico Ploch

## 2015-12-10 NOTE — Op Note (Signed)
[facility]astroenterology Patient Name: Natalie Coleman Procedure Date: 12/10/2015 9:55 AM MRN: [id number] Account #: [id number] Date of Birth: 08/10/1940 Admit Type: Outpatient Age: 89 [facility] END[facility] ROOM 1 Gender: Female Note Status: Finalized Procedure:            Colonoscopy Indications:          High risk colon cancer surveillance: Personal history                        of colonic polyps Providers:            Earline MayotteJeffrey W. Freida Nebel, MD Referring MD:         Duncan Dulleresa Tullo, MD (Referring MD) Medicines:            Monitored Anesthesia Care Complications:        No immediate complications. Procedure:            Pre-Anesthesia Assessment:                       - Prior to the procedure, a History and Physical was                        performed, and patient medications, allergies and                        sensitivities were reviewed. The patient's tolerance of                        previous anesthesia was reviewed.                       - The risks and benefits of the procedure and the                        sedation options and risks were discussed with the                        patient. All questions were answered and informed                        consent was obtained.                       After obtaining informed consent, the colonoscope was                        passed under direct vision. Throughout the procedure,                        the patient's blood pressure, pulse, and oxygen                        saturations were monitored continuously. The                        Colonoscope was introduced through the anus and                        advanced to the the cecum, identified by appendiceal  orifice and ileocecal valve. The colonoscopy was                        performed without difficulty. The patient tolerated the                        procedure well. The quality of the bowel preparation                        was  adequate to identify polyps 6 mm and larger in size. Findings:      The entire examined colon appeared normal on direct and retroflexion       views. Impression:           - The entire examined colon is normal on direct and                        retroflexion views.                       - No specimens collected. Recommendation:       - Discharge patient to home (via wheelchair). Procedure Code(s):    --- Professional ---                       [phone], Colonoscopy, flexible; diagnostic, including                        collection of specimen(s) by brushing or washing, when                        performed (separate procedure) Diagnosis Code(s):    --- Professional ---                       Z86.010, Personal history of colonic polyps CPT copyright 2016 [employer]. All rights reserved. The codes documented in this report are preliminary and upon coder review may  be revised to meet current compliance requirements. Earline Mayotte, MD 12/10/2015 10:36:19 AM This report has been signed electronically. Number of Addenda: 0 Note Initiated On: 12/10/2015 9:55 AM Scope Withdrawal Time: 0 hours 16 minutes 37 seconds  Total Procedure Duration: 0 hours 31 minutes 9 seconds       [facility]

## 2015-12-10 NOTE — H&P (Signed)
Natalie Coleman [id number] 03/30/1940     HPI: [id number] old woman who had reports of a 5 mm polyp in 2009. Pathology was not available. Normal exam except for diverticulosis in 2011. For follow-up colonoscopy.  She tolerated the prep well but was concerned that she had not Dulcolax as she had last time. He reports no formed stool. We'll proceed with colonoscopy.  Prescriptions Prior to Admission  Medication Sig Dispense Refill Last Dose  . ammonium lactate (AMLACTIN) 12 % lotion Apply 1 application topically as needed for dry skin. 400 g 0   . aspirin 81 MG tablet Take 81 mg by mouth daily.   Taking  . Biotin 5000 MCG CAPS Take 1 capsule by mouth daily.   Taking  . calcium carbonate 200 MG capsule Take 250 mg by mouth 2 (two) times daily with a meal. Patient taking 2 tablet three times daily   Taking  . Cholecalciferol (VITAMIN D3) 1000 UNITS CAPS Take 2,000 Units by mouth daily.    Taking  . Coenzyme Q10 100 MG TABS Take 1 tablet by mouth daily.   Taking  . denosumab (PROLIA) 60 MG/ML SOLN injection Inject 60 mg into the skin every 6 (six) months. Administer in upper arm, thigh, or abdomen     . diclofenac sodium (VOLTAREN) 1 % GEL Apply 2 g topically 4 (four) times daily. 100 g 11   . GARLIC PO Take 1 capsule by mouth daily.   Taking  . Lactobacillus-Inulin (CULTURELLE DIGESTIVE HEALTH) CAPS Take 1 capsule by mouth daily. 14 capsule 0 Taking  . Magnesium 250 MG TABS Take 1 tablet by mouth daily.   Taking  . Melatonin 5 MG TABS Take 1 tablet by mouth as needed.   Taking  . metoCLOPramide (REGLAN) 10 MG tablet Take 1 tablet (10 mg total) by mouth as directed. Take 1 tab 30 min prior to prep and one as needed. 2 tablet 0   . Multiple Vitamins-Minerals (CENTRUM SILVER PO) Take 1 tablet by mouth daily.   Taking  . Tdap (BOOSTRIX) 5-2.5-18.5 LF-MCG/0.5 injection Inject 0.5 mLs into the muscle once. 0.5 mL 0 Taking  . VALERIAN ROOT PO Take 1 capsule by mouth as needed.   Taking  . vitamin B-12  (CYANOCOBALAMIN) 1000 MCG tablet Take 5,000 mcg by mouth daily.    Taking  . vitamin C (ASCORBIC ACID) 500 MG tablet Take 500 mg by mouth daily.   Taking  . zinc gluconate 50 MG tablet Take 50 mg by mouth daily.   Taking   Allergies  Allergen Reactions  . Dexamethasone     Excessive jitteriness , confusion    Past Medical History:  Diagnosis Date  . Colon polyps   . GERD (gastroesophageal reflux disease)   . Hemorrhoids   . History of blood transfusion   . Osteoporosis    Past Surgical History:  Procedure Laterality Date  . CESAREAN SECTION  [id number]  . CHOLECYSTECTOMY  2008  . COLONOSCOPY  2009  . EYE SURGERY Right   . FRACTURE SURGERY     right tibia  . ORIF ANKLE FRACTURE BIMALLEOLAR  March 2006  . ORIF TIBIA & FIBULA FRACTURES  March 2006   Social History   Social History  . Marital status: Divorced    Spouse name: N/A  . Number of children: N/A  . Years of education: N/A   Occupational History  . Not on file.   Social History Main Topics  . Smoking status: Never Smoker  .  Smokeless tobacco: Never Used  . Alcohol use 0.6 oz/week    1 Glasses of wine per week     Comment: ocassional  . Drug use: No  . Sexual activity: No   Other Topics Concern  . Not on file   Social History Narrative  . No narrative on file   Social History   Social History Narrative  . No narrative on file     ROS: Negative.     PE: HEENT: Negative. Lungs: Clear. Cardio: RR.  ssessment/Plan:  Proceed with planned endoscopy.   Earline Mayotte 12/10/2015   A

## 2015-12-10 NOTE — Anesthesia Procedure Notes (Signed)
Date/Time: 12/10/2015 9:56 AM Performed by: Stormy FabianURTIS, Lanyah Spengler Pre-anesthesia Checklist: Patient identified, Emergency Drugs available, Suction available and Patient being monitored Patient Re-evaluated:Patient Re-evaluated prior to inductionOxygen Delivery Method: Nasal cannula Intubation Type: IV induction Dental Injury: Teeth and Oropharynx as per pre-operative assessment  Comments: Nasal cannula with etCO2 monitoring

## 2015-12-10 NOTE — Transfer of Care (Signed)
Immediate Anesthesia Transfer of Care Note  Patient: Natalie Coleman  Procedure(s) Performed: Procedure(s): COLONOSCOPY WITH PROPOFOL (N/A)  Patient Location: PACU and Endoscopy Unit  Anesthesia Type:General  Level of Consciousness: sedated  Airway & Oxygen Therapy: Patient Spontanous Breathing and Patient connected to nasal cannula oxygen  Post-op Assessment: Report given to RN and Post -op Vital signs reviewed and stable  Post vital signs: Reviewed and stable  Last Vitals:  Vitals:   12/10/15 0917 12/10/15 1043  BP: (!) 143/84 134/77  Pulse: 69   Resp: 17 10  Temp: 36.2 C 36.1 C    Complications: No apparent anesthesia complications

## 2015-12-10 NOTE — OR Nursing (Signed)
PT feels weak and shaky. States she cannot get dressed now.

## 2015-12-30 ENCOUNTER — Ambulatory Visit: Payer: Medicare Other

## 2016-01-12 ENCOUNTER — Ambulatory Visit
Admission: RE | Admit: 2016-01-12 | Discharge: 2016-01-12 | Disposition: A | Discharge status: Home / Self Care | Payer: Medicare Other | Source: Ambulatory Visit | Attending: Internal Medicine | Admitting: Internal Medicine

## 2016-01-12 DIAGNOSIS — Z1231 Encounter for screening mammogram for malignant neoplasm of breast: Secondary | ICD-10-CM | POA: Diagnosis present

## 2016-01-12 DIAGNOSIS — Z1239 Encounter for other screening for malignant neoplasm of breast: Secondary | ICD-10-CM

## 2016-01-12 DIAGNOSIS — M1990 Unspecified osteoarthritis, unspecified site: Secondary | ICD-10-CM | POA: Diagnosis present

## 2016-01-12 DIAGNOSIS — M810 Age-related osteoporosis without current pathological fracture: Secondary | ICD-10-CM | POA: Diagnosis present

## 2016-01-16 ENCOUNTER — Ambulatory Visit (INDEPENDENT_AMBULATORY_CARE_PROVIDER_SITE_OTHER): Payer: Medicare Other | Admitting: Internal Medicine

## 2016-01-16 ENCOUNTER — Encounter: Payer: Self-pay | Admitting: Internal Medicine

## 2016-01-16 VITALS — BP 118/74 | HR 70 | Temp 97.6°F | Resp 12 | Ht <= 58 in | Wt 111.0 lb

## 2016-01-16 DIAGNOSIS — E785 Hyperlipidemia, unspecified: Secondary | ICD-10-CM

## 2016-01-16 DIAGNOSIS — R5383 Other fatigue: Secondary | ICD-10-CM

## 2016-01-16 DIAGNOSIS — Z0000 Encounter for general adult medical examination without abnormal findings: Secondary | ICD-10-CM

## 2016-01-16 DIAGNOSIS — E559 Vitamin D deficiency, unspecified: Secondary | ICD-10-CM

## 2016-01-16 DIAGNOSIS — F411 Generalized anxiety disorder: Secondary | ICD-10-CM

## 2016-01-16 DIAGNOSIS — M810 Age-related osteoporosis without current pathological fracture: Secondary | ICD-10-CM

## 2016-01-16 LAB — LIPID PANEL
CHOL/HDL RATIO: 4
Cholesterol: 218 mg/dL — ABNORMAL HIGH (ref 0–200)
HDL: 55.3 mg/dL (ref >39.00)
LDL CALC: 124 mg/dL — AB (ref 0–99)
NonHDL: 162.75
TRIGLYCERIDES: 196 mg/dL — AB (ref 0.0–149.0)
VLDL: 39.2 mg/dL (ref 0.0–40.0)

## 2016-01-16 LAB — COMPREHENSIVE METABOLIC PANEL
ALK PHOS: 50 U/L (ref 39–117)
ALT: 16 U/L (ref 0–35)
AST: 17 U/L (ref 0–37)
Albumin: 4.5 g/dL (ref 3.5–5.2)
BILIRUBIN TOTAL: 0.4 mg/dL (ref 0.2–1.2)
BUN: 23 mg/dL (ref 6–23)
CALCIUM: 9.5 mg/dL (ref 8.4–10.5)
CO2: 30 meq/L (ref 19–32)
CREATININE: 0.73 mg/dL (ref 0.40–1.20)
Chloride: 102 mEq/L (ref 96–112)
GFR: 82.12 mL/min (ref >60.00)
GLUCOSE: 89 mg/dL (ref 70–99)
Potassium: 4.1 mEq/L (ref 3.5–5.1)
Sodium: 140 mEq/L (ref 135–145)
TOTAL PROTEIN: 7 g/dL (ref 6.0–8.3)

## 2016-01-16 LAB — CBC WITH DIFFERENTIAL/PLATELET
BASOS ABS: 0 10*3/uL (ref 0.0–0.1)
Basophils Relative: 0.5 % (ref 0.0–3.0)
EOS ABS: 0.2 10*3/uL (ref 0.0–0.7)
Eosinophils Relative: 3 % (ref 0.0–5.0)
HEMATOCRIT: 38.8 % (ref 36.0–46.0)
HEMOGLOBIN: 13.2 g/dL (ref 12.0–15.0)
LYMPHS PCT: 34.9 % (ref 12.0–46.0)
Lymphs Abs: 2.5 10*3/uL (ref 0.7–4.0)
MCHC: 34 g/dL (ref 30.0–36.0)
MCV: 90.8 fl (ref 78.0–100.0)
MONO ABS: 0.5 10*3/uL (ref 0.1–1.0)
Monocytes Relative: 7.6 % (ref 3.0–12.0)
Neutro Abs: 3.8 10*3/uL (ref 1.4–7.7)
Neutrophils Relative %: 54 % (ref 43.0–77.0)
Platelets: 248 10*3/uL (ref 150.0–400.0)
RBC: 4.27 Mil/uL (ref 3.87–5.11)
RDW: 13.1 % (ref 11.5–15.5)
WBC: 7.1 10*3/uL (ref 4.0–10.5)

## 2016-01-16 LAB — LDL CHOLESTEROL, DIRECT: LDL DIRECT: 115 mg/dL

## 2016-01-16 LAB — TSH: TSH: 0.98 u[IU]/mL (ref 0.35–4.50)

## 2016-01-16 LAB — VITAMIN D 25 HYDROXY (VIT D DEFICIENCY, FRACTURES): VITD: 63.19 ng/mL (ref 30.00–100.00)

## 2016-01-16 NOTE — Progress Notes (Signed)
Patient ID: Camillo Flamingievelis C Hu, female    DOB: 12/27/1940  Age: 75 y.o. MRN: [id number]  The patient is here for annual physical xamination and management of other chronic and acute problems.  Mammogram and DEXA done nov 27 Refuses flu vaccine Osteoporosis by DEXA   On Prolia     The risk factors are reflected in the social history.  The roster of all physicians providing medical care to patient - is listed in the Snapshot section of the chart.  Activities of daily living:  The patient is 100% independent in all ADLs: dressing, toileting, feeding as well as independent mobility  No trouble with balance,  But has chronic vertigo.  Her episodes of vertigo have been occurring more frequently,  Occurring with tilting head back.  No recent falls.   Home safety : The patient has smoke detectors in the home. They wear seatbelts.  There are no firearms at home. There is no violence in the home.   There is no risks for hepatitis, STDs or HIV. There is no   history of blood transfusion. They have no travel history to infectious disease endemic areas of the world.  The patient has seen their dentist in the last six months. They have seen their eye doctor in the last year. They admit to slight hearing difficulty with regard to whispered voices and some television programs.  They have deferred audiologic testing in the last year.  They do not  have excessive sun exposure. Discussed the need for sun protection: hats, long sleeves and use of sunscreen if there is significant sun exposure.   Diet: the importance of a healthy diet is discussed. They do have a healthy diet.  The benefits of regular aerobic exercise were discussed. She walks 4 times per week ,  20 minutes.   Depression screen: there are no signs or vegative symptoms of depression- irritability, change in appetite, anhedonia, sadness/tearfullness.  Cognitive assessment: the patient manages all their financial and personal affairs and is  actively engaged. They could relate day,date,year and events; recalled 1/3 objects at 3 minutes; performed clock-face test normally.   The following portions of the patient's history were reviewed and updated as appropriate: allergies, current medications, past family history, past medical history,  past surgical history, past social history  and problem list.  Visual acuity was not assessed per patient preference since she has regular follow up with her ophthalmologist. Hearing and body mass index were assessed and reviewed.   During the course of the visit the patient was educated and counseled about appropriate screening and preventive services including : fall prevention , diabetes screening, nutrition counseling, colorectal cancer screening, and recommended immunizations.    CC: The primary encounter diagnosis was Hyperlipidemia with target LDL less than 130. Diagnoses of Fatigue, unspecified type, Vitamin D deficiency, Anxiety state, Encounter for preventive health examination, and Osteoporosis, postmenopausal were also pertinent to this visit.  History Garima has a past medical history of Colon polyps; GERD (gastroesophageal reflux disease); Hemorrhoids; History of blood transfusion; and Osteoporosis.   She has a past surgical history that includes Fracture surgery; Cholecystectomy (2008); ORIF tibia & fibula fractures (March 2006); ORIF ankle fracture bimalleolar (March 2006); Colonoscopy (2009); Eye surgery (Right); Cesarean section ([id number]); and Colonoscopy with propofol (N/A, 12/10/2015).   Her family history includes Breast cancer in her cousin; Hypertension in her father, mother, and sister.She reports that she has never smoked. She has never used smokeless tobacco. She reports that she drinks about 0.6  oz of alcohol per week . She reports that she does not use drugs.  Outpatient Medications Prior to Visit  Medication Sig Dispense Refill  . ammonium lactate (AMLACTIN) 12 % lotion  Apply 1 application topically as needed for dry skin. 400 g 0  . aspirin 81 MG tablet Take 81 mg by mouth daily.    . Biotin 5000 MCG CAPS Take 1 capsule by mouth daily.    . Cholecalciferol (VITAMIN D3) 1000 UNITS CAPS Take 2,000 Units by mouth daily.     . Coenzyme Q10 100 MG TABS Take 1 tablet by mouth daily.    Marland Kitchen. denosumab (PROLIA) 60 MG/ML SOLN injection Inject 60 mg into the skin every 6 (six) months. Administer in upper arm, thigh, or abdomen    . GARLIC PO Take 1 capsule by mouth daily.    . Lactobacillus-Inulin (CULTURELLE DIGESTIVE HEALTH) CAPS Take 1 capsule by mouth daily. 14 capsule 0  . Magnesium 250 MG TABS Take 1 tablet by mouth daily.    . Melatonin 5 MG TABS Take 1 tablet by mouth as needed.    . Multiple Vitamins-Minerals (CENTRUM SILVER PO) Take 1 tablet by mouth daily.    Marland Kitchen. VALERIAN ROOT PO Take 1 capsule by mouth as needed.    . vitamin B-12 (CYANOCOBALAMIN) 1000 MCG tablet Take 5,000 mcg by mouth daily.     . vitamin C (ASCORBIC ACID) 500 MG tablet Take 500 mg by mouth daily.    . diclofenac sodium (VOLTAREN) 1 % GEL Apply 2 g topically 4 (four) times daily. (Patient not taking: Reported on 01/16/2016) 100 g 11  . Tdap (BOOSTRIX) 5-2.5-18.5 LF-MCG/0.5 injection Inject 0.5 mLs into the muscle once. (Patient not taking: Reported on 01/16/2016) 0.5 mL 0  . zinc gluconate 50 MG tablet Take 50 mg by mouth daily.    . calcium carbonate 200 MG capsule Take 250 mg by mouth 2 (two) times daily with a meal. Patient taking 2 tablet three times daily    . metoCLOPramide (REGLAN) 10 MG tablet Take 1 tablet (10 mg total) by mouth as directed. Take 1 tab 30 min prior to prep and one as needed. (Patient not taking: Reported on 01/16/2016) 2 tablet 0   No facility-administered medications prior to visit.     Review of Systems   Patient denies headache, fevers, malaise, unintentional weight loss, skin rash, eye pain, sinus congestion and sinus pain, sore throat, dysphagia,  hemoptysis ,  cough, dyspnea, wheezing, chest pain, palpitations, orthopnea, edema, abdominal pain, nausea, melena, diarrhea, constipation, flank pain, dysuria, hematuria, urinary  Frequency, nocturia, numbness, tingling, seizures,  Focal weakness, Loss of consciousness,  Tremor, insomnia, depression, anxiety, and suicidal ideation.      Objective:  BP 118/74   Pulse 70   Temp 97.6 F (36.4 C) (Oral)   Resp 12   Ht 4' 6.25" (1.378 m)   Wt 111 lb (50.3 kg)   SpO2 98%   BMI 26.52 kg/m   Physical Exam   General appearance: alert, cooperative and appears stated age Head: Normocephalic, without obvious abnormality, atraumatic Eyes: conjunctivae/corneas clear. PERRL, EOM's intact. Fundi benign. Ears: normal TM's and external ear canals both ears Nose: Nares normal. Septum midline. Mucosa normal. No drainage or sinus tenderness. Throat: lips, mucosa, and tongue normal; teeth and gums normal Neck: no adenopathy, no carotid bruit, no JVD, supple, symmetrical, trachea midline and thyroid not enlarged, symmetric, no tenderness/mass/nodules Lungs: clear to auscultation bilaterally Breasts: normal appearance, no masses or tenderness  Heart: regular rate and rhythm, S1, S2 normal, no murmur, click, rub or gallop Abdomen: soft, non-tender; bowel sounds normal; no masses,  no organomegaly Extremities: extremities normal, atraumatic, no cyanosis or edema Pulses: 2+ and symmetric Skin: Skin color, texture, turgor normal. No rashes or lesions Neurologic: Alert and oriented X 3, normal strength and tone. Normal symmetric reflexes. Normal coordination and gait.     Assessment & Plan:   Problem List Items Addressed This Visit    Anxiety state    Patient is generally very healthy but continues to worry excessively about conditions and situations over which she has very little control. She is not interested in taking medication or seeing Psychology       Encounter for preventive health examination    Annual  comprehensive preventive exam was done as well as an evaluation and management of chronic conditions .  During the course of the visit the patient was educated and counseled about appropriate screening and preventive services including :  diabetes screening, lipid analysis with projected  10 year  risk for CAD , nutrition counseling, breast, cervical and colorectal cancer screening, and recommended immunizations.  Printed recommendations for health maintenance screenings was given      Hyperlipidemia with target LDL less than 130 - Primary    Mild, with 10 year risk 10%.  pravastatin vs red yeast rice advised.       Relevant Orders   Lipid panel (Completed)   LDL cholesterol, direct (Completed)   Osteoporosis, postmenopausal    With prior pelvic fracture, Managed with Prolia,  T scores have improved       Relevant Medications   Calcium Citrate 250 MG TABS    Other Visit Diagnoses    Fatigue, unspecified type       Relevant Orders   TSH (Completed)   CBC with Differential/Platelet (Completed)   Hepatitis C antibody (Completed)   Comprehensive metabolic panel (Completed)   Vitamin D deficiency       Relevant Orders   VITAMIN D 25 Hydroxy (Vit-D Deficiency, Fractures) (Completed)      I have discontinued Ms. Oubre's calcium carbonate and metoCLOPramide. I am also having her maintain her Multiple Vitamins-Minerals (CENTRUM SILVER PO), GARLIC PO, Vitamin D3, vitamin C, vitamin B-12, aspirin, VALERIAN ROOT PO, Melatonin, CULTURELLE DIGESTIVE HEALTH, Magnesium, zinc gluconate, Biotin, Coenzyme Q10, Tdap, denosumab, diclofenac sodium, ammonium lactate, and Calcium Citrate.  Meds ordered this encounter  Medications  . Calcium Citrate 250 MG TABS    Sig: Take 2 tablets by mouth 3 (three) times daily.    Medications Discontinued During This Encounter  Medication Reason  . calcium carbonate 200 MG capsule Error  . metoCLOPramide (REGLAN) 10 MG tablet Completed Course    Follow-up:  Return in about 6 months (around 07/16/2016).   Sherlene Shams, MD

## 2016-01-16 NOTE — Patient Instructions (Addendum)
Your  Bone density has improved based on your repeat DEXA scan  I recommend that you continue receiving Prolia every 6 months   Your mammogram is normal.  I recommend continuing annual mammograms    DO NOT BE ANXIOUS.  IT IS NOT BIBLICALLY ADVISED!!!    "Be anxious for nothing,  But in everything , by prayer and supplication , let your request by made known to God. And the peace of God which surpasses all understanding, shall guard your hearts and minds through [employer]."  Health Maintenance, Female Introduction Adopting a healthy lifestyle and getting preventive care can go a long way to promote health and wellness. Talk with your health care provider about what schedule of regular examinations is right for you. This is a good chance for you to check in with your provider about disease prevention and staying healthy. In between checkups, there are plenty of things you can do on your own. Experts have done a lot of research about which lifestyle changes and preventive measures are most likely to keep you healthy. Ask your health care provider for more information. Weight and diet Eat a healthy diet  Be sure to include plenty of vegetables, fruits, low-fat dairy products, and lean protein.  Do not eat a lot of foods high in solid fats, added sugars, or salt.  Get regular exercise. This is one of the most important things you can do for your health.  Most adults should exercise for at least 150 minutes each week. The exercise should increase your heart rate and make you sweat (moderate-intensity exercise).  Most adults should also do strengthening exercises at least twice a week. This is in addition to the moderate-intensity exercise. Maintain a healthy weight  Body mass index (BMI) is a measurement that can be used to identify possible weight problems. It estimates body fat based on height and weight. Your health care provider can help determine your BMI and help you achieve or  maintain a healthy weight.  For females 36 years of age and older:  A BMI below 18.5 is considered underweight.  A BMI of 18.5 to 24.9 is normal.  A BMI of 25 to 29.9 is considered overweight.  A BMI of 30 and above is considered obese. Watch levels of cholesterol and blood lipids  You should start having your blood tested for lipids and cholesterol at 77 years of age, then have this test every 5 years.  You may need to have your cholesterol levels checked more often if:  Your lipid or cholesterol levels are high.  You are older than 77 years of age.  You are at high risk for heart disease. Cancer screening Lung Cancer  Lung cancer screening is recommended for adults 16-26 years old who are at high risk for lung cancer because of a history of smoking.  A yearly low-dose CT scan of the lungs is recommended for people who:  Currently smoke.  Have quit within the past 15 years.  Have at least a 30-pack-year history of smoking. A pack year is smoking an average of one pack of cigarettes a day for 1 year.  Yearly screening should continue until it has been 15 years since you quit.  Yearly screening should stop if you develop a health problem that would prevent you from having lung cancer treatment. Breast Cancer  Practice breast self-awareness. This means understanding how your breasts normally appear and feel.  It also means doing regular breast self-exams. Let your health  care provider know about any changes, no matter how small.  If you are in your 20s or 30s, you should have a clinical breast exam (CBE) by a health care provider every 1-3 years as part of a regular health exam.  If you are 67 or older, have a CBE every year. Also consider having a breast X-ray (mammogram) every year.  If you have a family history of breast cancer, talk to your health care provider about genetic screening.  If you are at high risk for breast cancer, talk to your health care provider  about having an MRI and a mammogram every year.  Breast cancer gene (BRCA) assessment is recommended for women who have family members with BRCA-related cancers. BRCA-related cancers include:  Breast.  Ovarian.  Tubal.  Peritoneal cancers.  Results of the assessment will determine the need for genetic counseling and BRCA1 and BRCA2 testing. Cervical Cancer  Your health care provider may recommend that you be screened regularly for cancer of the pelvic organs (ovaries, uterus, and vagina). This screening involves a pelvic examination, including checking for microscopic changes to the surface of your cervix (Pap test). You may be encouraged to have this screening done every 3 years, beginning at age 85.  For women ages 68-65, health care providers may recommend pelvic exams and Pap testing every 3 years, or they may recommend the Pap and pelvic exam, combined with testing for human papilloma virus (HPV), every 5 years. Some types of HPV increase your risk of cervical cancer. Testing for HPV may also be done on women of any age with unclear Pap test results.  Other health care providers may not recommend any screening for nonpregnant women who are considered low risk for pelvic cancer and who do not have symptoms. Ask your health care provider if a screening pelvic exam is right for you.  If you have had past treatment for cervical cancer or a condition that could lead to cancer, you need Pap tests and screening for cancer for at least 20 years after your treatment. If Pap tests have been discontinued, your risk factors (such as having a new sexual partner) need to be reassessed to determine if screening should resume. Some women have medical problems that increase the chance of getting cervical cancer. In these cases, your health care provider may recommend more frequent screening and Pap tests. Colorectal Cancer  This type of cancer can be detected and often prevented.  Routine colorectal  cancer screening usually begins at 77 years of age and continues through 77 years of age.  Your health care provider may recommend screening at an earlier age if you have risk factors for colon cancer.  Your health care provider may also recommend using home test kits to check for hidden blood in the stool.  A small camera at the end of a tube can be used to examine your colon directly (sigmoidoscopy or colonoscopy). This is done to check for the earliest forms of colorectal cancer.  Routine screening usually begins at age 22.  Direct examination of the colon should be repeated every 5-10 years through 77 years of age. However, you may need to be screened more often if early forms of precancerous polyps or small growths are found. Skin Cancer  Check your skin from head to toe regularly.  Tell your health care provider about any new moles or changes in moles, especially if there is a change in a mole's shape or color.  Also tell your health  care provider if you have a mole that is larger than the size of a pencil eraser.  Always use sunscreen. Apply sunscreen liberally and repeatedly throughout the day.  Protect yourself by wearing long sleeves, pants, a wide-brimmed hat, and sunglasses whenever you are outside. Heart disease, diabetes, and high blood pressure  High blood pressure causes heart disease and increases the risk of stroke. High blood pressure is more likely to develop in:  People who have blood pressure in the high end of the normal range (130-139/85-89 mm Hg).  People who are overweight or obese.  People who are African American.  If you are 57-65 years of age, have your blood pressure checked every 3-5 years. If you are 36 years of age or older, have your blood pressure checked every year. You should have your blood pressure measured twice-once when you are at a hospital or clinic, and once when you are not at a hospital or clinic. Record the average of the two  measurements. To check your blood pressure when you are not at a hospital or clinic, you can use:  An automated blood pressure machine at a pharmacy.  A home blood pressure monitor.  If you are between 41 years and 1 years old, ask your health care provider if you should take aspirin to prevent strokes.  Have regular diabetes screenings. This involves taking a blood sample to check your fasting blood sugar level.  If you are at a normal weight and have a low risk for diabetes, have this test once every three years after 77 years of age.  If you are overweight and have a high risk for diabetes, consider being tested at a younger age or more often. Preventing infection Hepatitis B  If you have a higher risk for hepatitis B, you should be screened for this virus. You are considered at high risk for hepatitis B if:  You were born in a country where hepatitis B is common. Ask your health care provider which countries are considered high risk.  Your parents were born in a high-risk country, and you have not been immunized against hepatitis B (hepatitis B vaccine).  You have HIV or AIDS.  You use needles to inject street drugs.  You live with someone who has hepatitis B.  You have had sex with someone who has hepatitis B.  You get hemodialysis treatment.  You take certain medicines for conditions, including cancer, organ transplantation, and autoimmune conditions. Hepatitis C  Blood testing is recommended for:  Everyone born from [birth year] through 1967.  Anyone with known risk factors for hepatitis C. Sexually transmitted infections (STIs)  You should be screened for sexually transmitted infections (STIs) including gonorrhea and chlamydia if:  You are sexually active and are younger than 77 years of age.  You are older than 77 years of age and your health care provider tells you that you are at risk for this type of infection.  Your sexual activity has changed since you were last  screened and you are at an increased risk for chlamydia or gonorrhea. Ask your health care provider if you are at risk.  If you do not have HIV, but are at risk, it may be recommended that you take a prescription medicine daily to prevent HIV infection. This is called pre-exposure prophylaxis (PrEP). You are considered at risk if:  You are sexually active and do not regularly use condoms or know the HIV status of your partner(s).  You take drugs by  injection.  You are sexually active with a partner who has HIV. Talk with your health care provider about whether you are at high risk of being infected with HIV. If you choose to begin PrEP, you should first be tested for HIV. You should then be tested every 3 months for as long as you are taking PrEP. Pregnancy  If you are premenopausal and you may become pregnant, ask your health care provider about preconception counseling.  If you may become pregnant, take 400 to 800 micrograms (mcg) of folic acid every day.  If you want to prevent pregnancy, talk to your health care provider about birth control (contraception). Osteoporosis and menopause  Osteoporosis is a disease in which the bones lose minerals and strength with aging. This can result in serious bone fractures. Your risk for osteoporosis can be identified using a bone density scan.  If you are 38 years of age or older, or if you are at risk for osteoporosis and fractures, ask your health care provider if you should be screened.  Ask your health care provider whether you should take a calcium or vitamin D supplement to lower your risk for osteoporosis.  Menopause may have certain physical symptoms and risks.  Hormone replacement therapy may reduce some of these symptoms and risks. Talk to your health care provider about whether hormone replacement therapy is right for you. Follow these instructions at home:  Schedule regular health, dental, and eye exams.  Stay current with your  immunizations.  Do not use any tobacco products including cigarettes, chewing tobacco, or electronic cigarettes.  If you are pregnant, do not drink alcohol.  If you are breastfeeding, limit how much and how often you drink alcohol.  Limit alcohol intake to no more than 1 drink per day for nonpregnant women. One drink equals 12 ounces of beer, 5 ounces of wine, or 1 ounces of hard liquor.  Do not use street drugs.  Do not share needles.  Ask your health care provider for help if you need support or information about quitting drugs.  Tell your health care provider if you often feel depressed.  Tell your health care provider if you have ever been abused or do not feel safe at home. This information is not intended to replace advice given to you by your health care provider. Make sure you discuss any questions you have with your health care provider. Document Released: 08/17/2010 Document Revised: 07/10/2015 Document Reviewed: 11/05/2014  2017 [employer]

## 2016-01-16 NOTE — Progress Notes (Signed)
Pre-visit discussion using our clinic review tool. No additional management support is needed unless otherwise documented below in the visit note.  

## 2016-01-17 LAB — HEPATITIS C ANTIBODY: HCV Ab: NEGATIVE

## 2016-01-18 NOTE — Assessment & Plan Note (Addendum)
With prior pelvic fracture, Managed with Prolia,  T scores have improved

## 2016-01-18 NOTE — Assessment & Plan Note (Signed)
Patient is generally very healthy but continues to worry excessively about conditions and situations over which she has very little control. She is not interested in taking medication or seeing Psychology

## 2016-01-18 NOTE — Assessment & Plan Note (Signed)
Annual comprehensive preventive exam was done as well as an evaluation and management of chronic conditions .  During the course of the visit the patient was educated and counseled about appropriate screening and preventive services including :  diabetes screening, lipid analysis with projected  10 year  risk for CAD , nutrition counseling, breast, cervical and colorectal cancer screening, and recommended immunizations.  Printed recommendations for health maintenance screenings was given 

## 2016-01-18 NOTE — Assessment & Plan Note (Signed)
Mild, with 10 year risk 10%.  pravastatin vs red yeast rice advised.

## 2016-01-20 ENCOUNTER — Encounter: Payer: Self-pay | Admitting: *Deleted

## 2016-02-02 ENCOUNTER — Ambulatory Visit (INDEPENDENT_AMBULATORY_CARE_PROVIDER_SITE_OTHER): Payer: Medicare Other

## 2016-02-02 VITALS — BP 122/72 | HR 68 | Temp 97.9°F | Resp 12 | Ht <= 58 in | Wt 110.0 lb

## 2016-02-02 DIAGNOSIS — Z0000 Encounter for general adult medical examination without abnormal findings: Secondary | ICD-10-CM | POA: Diagnosis not present

## 2016-02-02 NOTE — Patient Instructions (Addendum)
Natalie Coleman , Thank you for taking time to come for your Medicare Wellness Visit. I appreciate your ongoing commitment to your health goals. Please review the following plan we discussed and let me know if I can assist you in the future.   Follow up with Dr. Darrick Huntsmanullo as needed.   These are the goals we discussed: Goals    . Increase physical activity          Stretch! Chair/standing exercises daily.          This is a list of the screening recommended for you and due dates:  Health Maintenance  Topic Date Due  . Tetanus Vaccine  02/01/2017*  . Flu Shot  03/17/2017*  . DEXA scan (bone density measurement)  Completed  . Shingles Vaccine  Completed  . Pneumonia vaccines  Completed  *Topic was postponed. The date shown is not the original due date.      Fall Prevention in the Home Introduction Falls can cause injuries. They can happen to people of all ages. There are many things you can do to make your home safe and to help prevent falls. What can I do on the outside of my home?  Regularly fix the edges of walkways and driveways and fix any cracks.  Remove anything that might make you trip as you walk through a door, such as a raised step or threshold.  Trim any bushes or trees on the path to your home.  Use bright outdoor lighting.  Clear any walking paths of anything that might make someone trip, such as rocks or tools.  Regularly check to see if handrails are loose or broken. Make sure that both sides of any steps have handrails.  Any raised decks and porches should have guardrails on the edges.  Have any leaves, snow, or ice cleared regularly.  Use sand or salt on walking paths during winter.  Clean up any spills in your garage right away. This includes oil or grease spills. What can I do in the bathroom?  Use night lights.  Install grab bars by the toilet and in the tub and shower. Do not use towel bars as grab bars.  Use non-skid mats or decals in the tub or  shower.  If you need to sit down in the shower, use a plastic, non-slip stool.  Keep the floor dry. Clean up any water that spills on the floor as soon as it happens.  Remove soap buildup in the tub or shower regularly.  Attach bath mats securely with double-sided non-slip rug tape.  Do not have throw rugs and other things on the floor that can make you trip. What can I do in the bedroom?  Use night lights.  Make sure that you have a light by your bed that is easy to reach.  Do not use any sheets or blankets that are too big for your bed. They should not hang down onto the floor.  Have a firm chair that has side arms. You can use this for support while you get dressed.  Do not have throw rugs and other things on the floor that can make you trip. What can I do in the kitchen?  Clean up any spills right away.  Avoid walking on wet floors.  Keep items that you use a lot in easy-to-reach places.  If you need to reach something above you, use a strong step stool that has a grab bar.  Keep electrical cords out of the  way.  Do not use floor polish or wax that makes floors slippery. If you must use wax, use non-skid floor wax.  Do not have throw rugs and other things on the floor that can make you trip. What can I do with my stairs?  Do not leave any items on the stairs.  Make sure that there are handrails on both sides of the stairs and use them. Fix handrails that are broken or loose. Make sure that handrails are as long as the stairways.  Check any carpeting to make sure that it is firmly attached to the stairs. Fix any carpet that is loose or worn.  Avoid having throw rugs at the top or bottom of the stairs. If you do have throw rugs, attach them to the floor with carpet tape.  Make sure that you have a light switch at the top of the stairs and the bottom of the stairs. If you do not have them, ask someone to add them for you. What else can I do to help prevent  falls?  Wear shoes that:  Do not have high heels.  Have rubber bottoms.  Are comfortable and fit you well.  Are closed at the toe. Do not wear sandals.  If you use a stepladder:  Make sure that it is fully opened. Do not climb a closed stepladder.  Make sure that both sides of the stepladder are locked into place.  Ask someone to hold it for you, if possible.  Clearly mark and make sure that you can see:  Any grab bars or handrails.  First and last steps.  Where the edge of each step is.  Use tools that help you move around (mobility aids) if they are needed. These include:  Canes.  Walkers.  Scooters.  Crutches.  Turn on the lights when you go into a dark area. Replace any light bulbs as soon as they burn out.  Set up your furniture so you have a clear path. Avoid moving your furniture around.  If any of your floors are uneven, fix them.  If there are any pets around you, be aware of where they are.  Review your medicines with your doctor. Some medicines can make you feel dizzy. This can increase your chance of falling. Ask your doctor what other things that you can do to help prevent falls. This information is not intended to replace advice given to you by your health care provider. Make sure you discuss any questions you have with your health care provider. Document Released: 11/28/2008 Document Revised: 07/10/2015 Document Reviewed: 03/08/2014  2017 [employer]

## 2016-02-02 NOTE — Progress Notes (Signed)
Subjective:   Natalie Coleman is a 75 y.o. female who presents for Medicare Annual (Subsequent) preventive examination.  Review of Systems:  No ROS.  Medicare Wellness Visit.  Cardiac Risk Factors include: advanced age (>19men, >55 women);hypertension     Objective:     Vitals: BP 122/72 (BP Location: Right Arm, Patient Position: Sitting, Cuff Size: Normal)   Pulse 68   Temp 97.9 F (36.6 C) (Oral)   Resp 12   Ht 4' 6.5" (1.384 m)   Wt 110 lb (49.9 kg)   SpO2 97%   BMI 26.04 kg/m   Body mass index is 26.04 kg/m.   Tobacco History  Smoking Status  . Never Smoker  Smokeless Tobacco  . Never Used     Counseling given: Not Answered   Past Medical History:  Diagnosis Date  . Colon polyps   . GERD (gastroesophageal reflux disease)   . Hemorrhoids   . History of blood transfusion   . Osteoporosis    Past Surgical History:  Procedure Laterality Date  . CESAREAN SECTION  [id number]  . CHOLECYSTECTOMY  2008  . COLONOSCOPY  2009  . COLONOSCOPY WITH PROPOFOL N/A 12/10/2015   Procedure: COLONOSCOPY WITH PROPOFOL;  Surgeon: Earline Mayotte, MD;  Location[facility] END[facility]COPY;  Service: Endoscopy;  Laterality: N/A;  . EYE SURGERY Right   . FRACTURE SURGERY     right tibia  . ORIF ANKLE FRACTURE BIMALLEOLAR  March 2006  . ORIF TIBIA & FIBULA FRACTURES  March 2006   Family History  Problem Relation Age of Onset  . Hypertension Father   . Hypertension Mother   . Hypertension Sister   . Breast cancer Cousin   . Cancer Neg Hx    History  Sexual Activity  . Sexual activity: No    Outpatient Encounter Prescriptions as of 02/02/2016  Medication Sig  . ammonium lactate (AMLACTIN) 12 % lotion Apply 1 application topically as needed for dry skin.  Marland Kitchen aspirin 81 MG tablet Take 81 mg by mouth daily.  . Biotin 5000 MCG CAPS Take 1 capsule by mouth daily.  . Calcium Citrate 250 MG TABS Take 2 tablets by mouth 3 (three) times daily.  . Cholecalciferol (VITAMIN D3) 1000  UNITS CAPS Take 2,000 Units by mouth daily.   . Coenzyme Q10 100 MG TABS Take 1 tablet by mouth daily.  Marland Kitchen denosumab (PROLIA) 60 MG/ML SOLN injection Inject 60 mg into the skin every 6 (six) months. Administer in upper arm, thigh, or abdomen  . diclofenac sodium (VOLTAREN) 1 % GEL Apply 2 g topically 4 (four) times daily.  Marland Kitchen GARLIC PO Take 1 capsule by mouth daily.  . Lactobacillus-Inulin (CULTURELLE DIGESTIVE HEALTH) CAPS Take 1 capsule by mouth daily.  . Magnesium 250 MG TABS Take 1 tablet by mouth daily.  . Melatonin 5 MG TABS Take 1 tablet by mouth as needed.  . Multiple Vitamins-Minerals (CENTRUM SILVER PO) Take 1 tablet by mouth daily.  . Tdap (BOOSTRIX) 5-2.5-18.5 LF-MCG/0.5 injection Inject 0.5 mLs into the muscle once.  Marland Kitchen VALERIAN ROOT PO Take 1 capsule by mouth as needed.  . vitamin B-12 (CYANOCOBALAMIN) 1000 MCG tablet Take 5,000 mcg by mouth daily.   . vitamin C (ASCORBIC ACID) 500 MG tablet Take 500 mg by mouth daily.  Marland Kitchen zinc gluconate 50 MG tablet Take 50 mg by mouth daily.   No facility-administered encounter medications on file as of 02/02/2016.     Activities of Daily Living In your present state  of health, do you have any difficulty performing the following activities: 02/02/2016  Hearing? N  Vision? N  Difficulty concentrating or making decisions? N  Walking or climbing stairs? Y  Dressing or bathing? N  Doing errands, shopping? N  Preparing Food and eating ? N  Using the Toilet? N  In the past six months, have you accidently leaked urine? N  Do you have problems with loss of bowel control? N  Managing your Medications? N  Managing your Finances? N  Housekeeping or managing your Housekeeping? N  Some recent data might be hidden    Patient Care Team: Sherlene Shamseresa L Tullo, MD as PCP - General (Internal Medicine) Earline MayotteJeffrey W Byrnett, MD (General Surgery) Sherlene Shamseresa L Tullo, MD (Internal Medicine)    Assessment:    This is a routine wellness examination for Natalie Coleman. The  goal of the wellness visit is to assist the patient how to close the gaps in care and create a preventative care plan for the patient.   Taking calcium VIT D3 and Calcium Citrate as appropriate/Osteoporosis reviewed.  Medications reviewed; taking without issues or barriers.  Safety issues reviewed; lives alone.  Smoke detectors in the home. No firearms in the home. Wears seatbelts when driving or riding with others. No violence in the home.  No identified risk were noted; The patient was oriented x 3; appropriate in dress and manner and no objective failures at ADL's or IADL's.   BMI; discussed the importance of a healthy diet, water intake and exercise. She has a sensible diet, adequate water intake and plans to increase her exercise regimen. Educational material provided.  TDAP deferred per patient preference.  Rx on file. Educational material provided.  Health maintenance gaps; closed.  Patient Concerns: None at this time. Follow up with PCP as needed.  Exercise Activities and Dietary recommendations Current Exercise Habits: Structured exercise class, Type of exercise: strength training/weights, Time (Minutes): 10, Frequency (Times/Week): 1, Weekly Exercise (Minutes/Week): 10  Goals    . Increase physical activity          Stretch! Chair/standing exercises daily.         Fall Risk Fall Risk  02/02/2016 01/14/2015 01/13/2015 12/30/2014 07/18/2013  Falls in the past year? No No No No No   Depression Screen PHQ 2/9 Scores 02/02/2016 01/14/2015 01/13/2015 12/30/2014  PHQ - 2 Score 0 0 0 0     Cognitive Function MMSE - Mini Mental State Exam 12/30/2014  Orientation to time 5  Orientation to Place 5  Registration 3  Attention/ Calculation 5  Recall 3  Language- name 2 objects 2  Language- repeat 1  Language- follow 3 step command 3  Language- read & follow direction 1  Write a sentence 1  Copy design 1  Total score 30        Immunization History    Administered Date(s) Administered  . Pneumococcal Conjugate-13 08/15/2012  . Pneumococcal Polysaccharide-23 08/17/2004, 01/13/2015  . Td 08/15/2004  . Zoster 01/18/2014   Screening Tests Health Maintenance  Topic Date Due  . TETANUS/TDAP  02/01/2017 (Originally 08/16/2014)  . INFLUENZA VACCINE  03/17/2017 (Originally 09/16/2015)  . DEXA SCAN  Completed  . ZOSTAVAX  Completed  . PNA vac Low Risk Adult  Completed      Plan:   End of life planning; Advance aging; Advanced directives discussed. Copy of current HCPOA/Living Will requested.  Medicare Attestation I have personally reviewed: The patient's medical and social history Their use of alcohol, tobacco or illicit drugs  Their current medications and supplements The patient's functional ability including ADLs,fall risks, home safety risks, cognitive, and hearing and visual impairment Diet and physical activities Evidence for depression   The patient's weight, height, BMI, and visual acuity have been recorded in the chart.  I have made referrals and provided education to the patient based on review of the above and I have provided the patient with a written personalized care plan for preventive services.    During the course of the visit the patient was educated and counseled about the following appropriate screening and preventive services:   Vaccines to include Pneumoccal, Influenza, Hepatitis B, Td, Zostavax, HCV  Electrocardiogram  Cardiovascular Disease  Colorectal cancer screening  Bone density screening  Diabetes screening  Glaucoma screening  Mammography/PAP  Nutrition counseling   Patient Instructions (the written plan) was given to the patient.   Ashok PallOBrien-Blaney, Addasyn Mcbreen L, LPN  16/10/960412/18/2017

## 2016-02-04 NOTE — Progress Notes (Signed)
  I have reviewed the above information and agree with above.   Tarica Harl, MD 

## 2016-03-02 ENCOUNTER — Telehealth: Payer: Self-pay | Admitting: Internal Medicine

## 2016-03-02 NOTE — Telephone Encounter (Signed)
Sent request to [employer] for Prolia.

## 2016-03-24 ENCOUNTER — Telehealth: Payer: Self-pay | Admitting: Internal Medicine

## 2016-03-24 NOTE — Telephone Encounter (Signed)
Pt called about going to be due for her Prolia injection on 04/08/2016. Auth is needed please and thank you!  Call pt @ [phone].

## 2016-04-01 NOTE — Telephone Encounter (Signed)
Patient notified I am waiting on authorization from insurance.

## 2016-04-12 ENCOUNTER — Ambulatory Visit (INDEPENDENT_AMBULATORY_CARE_PROVIDER_SITE_OTHER): Payer: Medicare Other

## 2016-04-12 DIAGNOSIS — M810 Age-related osteoporosis without current pathological fracture: Secondary | ICD-10-CM

## 2016-04-12 MED ORDER — DENOSUMAB 60 MG/ML ~~LOC~~ SOLN
60.0000 mg | Freq: Once | SUBCUTANEOUS | Status: AC
  Administered 2016-04-12: 60 mg via SUBCUTANEOUS

## 2016-04-12 NOTE — Progress Notes (Signed)
Patient comes in for Prolia injection.  Injection given in left arm subcutaneously.  Patient tolerated well.

## 2016-04-14 NOTE — Progress Notes (Signed)
  I have reviewed the above information and agree with above.   Ulises Wolfinger, MD 

## 2016-07-16 ENCOUNTER — Ambulatory Visit: Payer: Medicare Other | Admitting: Internal Medicine

## 2016-08-12 ENCOUNTER — Telehealth: Payer: Self-pay | Admitting: *Deleted

## 2016-08-12 ENCOUNTER — Ambulatory Visit (INDEPENDENT_AMBULATORY_CARE_PROVIDER_SITE_OTHER): Payer: Medicare Other | Admitting: Internal Medicine

## 2016-08-12 ENCOUNTER — Encounter: Payer: Self-pay | Admitting: Internal Medicine

## 2016-08-12 VITALS — BP 104/78 | HR 96 | Temp 98.2°F | Resp 15 | Ht <= 58 in | Wt 107.6 lb

## 2016-08-12 DIAGNOSIS — E785 Hyperlipidemia, unspecified: Secondary | ICD-10-CM | POA: Diagnosis not present

## 2016-08-12 DIAGNOSIS — E559 Vitamin D deficiency, unspecified: Secondary | ICD-10-CM

## 2016-08-12 DIAGNOSIS — M791 Myalgia, unspecified site: Secondary | ICD-10-CM

## 2016-08-12 DIAGNOSIS — F411 Generalized anxiety disorder: Secondary | ICD-10-CM

## 2016-08-12 DIAGNOSIS — M810 Age-related osteoporosis without current pathological fracture: Secondary | ICD-10-CM | POA: Diagnosis not present
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
## 2016-08-12 NOTE — Telephone Encounter (Signed)
FYI Patient is currently taking [employer] Back and Pain extra strength 500 mg. Pt would like correct dosage that she should be taking  Pt contact [phone]

## 2016-08-12 NOTE — Telephone Encounter (Signed)
tha medication is loaded with aspirin,  500 mg so she should not be taking it since she is alread taking a baby aspirin daily .  She should use aleve or motrin as I instructed her

## 2016-08-12 NOTE — Patient Instructions (Addendum)
   Please treat your arthritis pain.  You can use Aleve twice daily   or ibuprofen 2 tablets twice daily EVERY DAY IF NEEDED    Please make an appointment for fasting blood work   I will let you know if you need to try red yeast rice

## 2016-08-12 NOTE — Telephone Encounter (Signed)
Please advise 

## 2016-08-12 NOTE — Telephone Encounter (Signed)
Spoke with pt and informed her of Dr. Melina Schoolsullo's message below. The pt stated that she would not take the medication any more.

## 2016-08-15 NOTE — Assessment & Plan Note (Signed)
With prior truamatic pelvic fracture, completed  5 years of therapy with bisphosphonates, now with 2.5 years of Prolia,  T scores appear to  have improved  And she has had no fragility fractures. Reassured her that there are other more probable causes for her pelvic pain and fatigue than the medication,  But offered a suspension of medication and referral to endocrinologist for alternative therapies but she has declined. Next DEX due nov 2019

## 2016-08-15 NOTE — Assessment & Plan Note (Signed)
Patient is generally very healthy but continues to worry excessively about conditions and situations over which she has very little control. She is not interested in taking medication or seeing Psychology  

## 2016-08-15 NOTE — Assessment & Plan Note (Signed)
Mild, with 10 year risk 10%.  pravastatin vs red yeast rice was advised at last visit but she has done neither  And would like to repeat her lipids.   Lab Results  Component Value Date   CHOL 218 (H) 01/16/2016   HDL 55.30 01/16/2016   LDLCALC 124 (H) 01/16/2016   LDLDIRECT 115.0 01/16/2016   TRIG 196.0 (H) 01/16/2016   CHOLHDL 4 01/16/2016

## 2016-08-19 ENCOUNTER — Other Ambulatory Visit (INDEPENDENT_AMBULATORY_CARE_PROVIDER_SITE_OTHER): Payer: Medicare Other

## 2016-08-19 DIAGNOSIS — M791 Myalgia, unspecified site: Secondary | ICD-10-CM

## 2016-08-19 DIAGNOSIS — E559 Vitamin D deficiency, unspecified: Secondary | ICD-10-CM | POA: Diagnosis not present

## 2016-08-19 DIAGNOSIS — E785 Hyperlipidemia, unspecified: Secondary | ICD-10-CM | POA: Diagnosis not present

## 2016-08-19 LAB — COMPREHENSIVE METABOLIC PANEL
ALBUMIN: 4.2 g/dL (ref 3.5–5.2)
ALK PHOS: 46 U/L (ref 39–117)
ALT: 15 U/L (ref 0–35)
AST: 17 U/L (ref 0–37)
BUN: 31 mg/dL — AB (ref 6–23)
CO2: 29 mEq/L (ref 19–32)
Calcium: 9.9 mg/dL (ref 8.4–10.5)
Chloride: 104 mEq/L (ref 96–112)
Creatinine, Ser: 0.81 mg/dL (ref 0.40–1.20)
GFR: 72.72 mL/min (ref >60.00)
GLUCOSE: 85 mg/dL (ref 70–99)
Potassium: 4.4 mEq/L (ref 3.5–5.1)
SODIUM: 140 meq/L (ref 135–145)
TOTAL PROTEIN: 6.9 g/dL (ref 6.0–8.3)
Total Bilirubin: 0.5 mg/dL (ref 0.2–1.2)

## 2016-08-19 LAB — SEDIMENTATION RATE: Sed Rate: 4 mm/hr (ref 0–30)

## 2016-08-19 LAB — LIPID PANEL
CHOL/HDL RATIO: 4
CHOLESTEROL: 212 mg/dL — AB (ref 0–200)
HDL: 56.5 mg/dL (ref >39.00)
LDL Cholesterol: 126 mg/dL — ABNORMAL HIGH (ref 0–99)
NonHDL: 155.5
TRIGLYCERIDES: 146 mg/dL (ref 0.0–149.0)
VLDL: 29.2 mg/dL (ref 0.0–40.0)

## 2016-08-19 LAB — CK: CK TOTAL: 50 U/L (ref 7–177)

## 2016-08-19 LAB — VITAMIN D 25 HYDROXY (VIT D DEFICIENCY, FRACTURES): VITD: 63.18 ng/mL (ref 30.00–100.00)

## 2016-08-23 ENCOUNTER — Telehealth: Payer: Self-pay | Admitting: Internal Medicine

## 2016-08-23 NOTE — Telephone Encounter (Signed)
See result note message 

## 2016-08-23 NOTE — Telephone Encounter (Signed)
Pt called and left a voicemail requesting lab results. Please advise, thank you!  CAll pt @ [phone]

## 2016-09-21 ENCOUNTER — Telehealth: Payer: Self-pay | Admitting: Internal Medicine

## 2016-09-21 MED ORDER — GABAPENTIN 100 MG PO CAPS: 100.0000 mg | ORAL_CAPSULE | Freq: Three times a day (TID) | ORAL | 3 refills | Status: DC

## 2016-09-21 NOTE — Telephone Encounter (Signed)
Pt called about Pain in left hip muscle/sometimes pain around ankle. Pt is scheduled to come in. Pt only wants to see Dr Darrick Huntsmanullo. Pt was transferred to [facility]. Thank you!

## 2016-09-21 NOTE — Telephone Encounter (Signed)
FYI- Patient was sent to [facility] earlier and does not want to see anyone but you. She is scheduled to see you on 09/27/16.

## 2016-09-21 NOTE — Telephone Encounter (Signed)
FYI pt has been sent to [facility].

## 2016-09-21 NOTE — Telephone Encounter (Signed)
She can take ibuprofen 600 mg every 8 hours and tylenol 500 mg every 8 hours for her pain.  I can also call in gabapentin to take just at night starting with a 100 mg dose that she can increase gradually to 300 mg.    If she she has had any recent falls  I will order lumbar spine and hip films to make sure there is no fracture

## 2016-09-21 NOTE — Telephone Encounter (Signed)
Patient Name: Natalie Coleman  DOB: 02/26/1940    Initial Comment Caller states she is having pain in left hip muscle, radiating to ankle.    Nurse Assessment  Nurse: Annye Englisharmon, RN, Denise Date/Time (Eastern Time): 09/21/2016 3:03:45 PM  Confirm and document reason for call. If symptomatic, describe symptoms. ---Caller states she is having pain in left hip muscle, radiating to ankle.  Does the patient have any new or worsening symptoms? ---Yes  Will a triage be completed? ---Yes  Related visit to physician within the last 2 weeks? ---No  Does the PT have any chronic conditions? (i.e. diabetes, asthma, etc.) ---No  Is this a behavioral health or substance abuse call? ---No     Guidelines    Guideline Title Affirmed Question Affirmed Notes  Hip Pain [1] MODERATE pain (e.g., interferes with normal activities, limping) AND [2] present > 3 days    Final Disposition User   See PCP When Office is Open (within 3 days) Carmon, RN, Angelique Blonderenise    Comments  Pt already has an appt scheduled for 09/27/16 at 6pm. She refuses to see any MD in the office and 09/27/16 is the earliest she can be seen.   Referrals  REFERRED TO PCP OFFICE   Disagree/Comply: Comply

## 2016-09-21 NOTE — Telephone Encounter (Signed)
fyi

## 2016-09-23 NOTE — Telephone Encounter (Signed)
Spoke with patient. Patient states that she doesn't think she needs the gabapentin. She has been taking ibuprofen 400 mg and it seems to help tolerate pain. Instructed patient that she could take 600 mg instead of 400mg . Patient has appointment with PCP 09/27/16. Patient stated she has not had any recent falls.

## 2016-09-27 ENCOUNTER — Encounter: Payer: Self-pay | Admitting: Internal Medicine

## 2016-09-27 ENCOUNTER — Ambulatory Visit (INDEPENDENT_AMBULATORY_CARE_PROVIDER_SITE_OTHER): Payer: Medicare Other

## 2016-09-27 ENCOUNTER — Ambulatory Visit (INDEPENDENT_AMBULATORY_CARE_PROVIDER_SITE_OTHER): Payer: Medicare Other | Admitting: Internal Medicine

## 2016-09-27 VITALS — BP 118/70 | HR 73 | Temp 98.0°F | Resp 16 | Ht <= 58 in | Wt 110.4 lb

## 2016-09-27 DIAGNOSIS — M25552 Pain in left hip: Secondary | ICD-10-CM

## 2016-09-27 MED ORDER — CELECOXIB 100 MG PO CAPS: 100.0000 mg | ORAL_CAPSULE | Freq: Two times a day (BID) | ORAL | 1 refills | Status: DC
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
## 2016-09-27 NOTE — Patient Instructions (Addendum)
Instead of aleve and advil , take the celebrex I have prescribed  every 12 hours   You can add tylenol 500 mg every 6 hours to the celebrex.   X rays today to look at the joints and hip and rule out a fracture   You can also continue to use SalonPas patch with lidocaine

## 2016-09-28 DIAGNOSIS — M25552 Pain in left hip: Secondary | ICD-10-CM

## 2016-09-28 DIAGNOSIS — G8929 Other chronic pain: Secondary | ICD-10-CM | POA: Insufficient documentation

## 2016-09-28 NOTE — Assessment & Plan Note (Addendum)
Plain films of pelvis were done today to rule out fracture of SI joint and hip .  Degenerative changes of the lumbar spine and hip were noted, but no fractures. Will offer MRI of lumbar spine given her persistent pain with radiation to left leg.  Trial of celebrex 100 mg twice daily instead of otc NSAIDs,  And add tylenol

## 2016-09-29 ENCOUNTER — Telehealth: Payer: Self-pay | Admitting: Internal Medicine

## 2016-09-29 ENCOUNTER — Other Ambulatory Visit: Payer: Self-pay | Admitting: Internal Medicine

## 2016-09-29 DIAGNOSIS — M5416 Radiculopathy, lumbar region: Secondary | ICD-10-CM | POA: Insufficient documentation

## 2016-09-29 DIAGNOSIS — M810 Age-related osteoporosis without current pathological fracture: Secondary | ICD-10-CM

## 2016-09-29 DIAGNOSIS — M5136 Other intervertebral disc degeneration, lumbar region: Secondary | ICD-10-CM

## 2016-09-29 NOTE — Telephone Encounter (Signed)
Pt called wanting to get her Prolia shot. Please advise?  Call pt @ [phone]. Thank you!

## 2016-10-04 NOTE — Telephone Encounter (Signed)
Pt questioning when she will get her Prolia shot. cb [phone]

## 2016-10-04 NOTE — Telephone Encounter (Signed)
Called and scheduled, thanks

## 2016-10-08 ENCOUNTER — Ambulatory Visit
Admission: RE | Admit: 2016-10-08 | Discharge: 2016-10-08 | Disposition: A | Discharge status: Home / Self Care | Payer: Medicare Other | Source: Ambulatory Visit | Attending: Internal Medicine | Admitting: Internal Medicine

## 2016-10-08 DIAGNOSIS — M5416 Radiculopathy, lumbar region: Secondary | ICD-10-CM

## 2016-10-08 DIAGNOSIS — M5136 Other intervertebral disc degeneration, lumbar region: Secondary | ICD-10-CM

## 2016-10-08 DIAGNOSIS — M810 Age-related osteoporosis without current pathological fracture: Secondary | ICD-10-CM

## 2016-10-11 ENCOUNTER — Ambulatory Visit (INDEPENDENT_AMBULATORY_CARE_PROVIDER_SITE_OTHER): Payer: Medicare Other | Admitting: *Deleted

## 2016-10-11 ENCOUNTER — Telehealth: Payer: Self-pay | Admitting: Internal Medicine

## 2016-10-11 DIAGNOSIS — M810 Age-related osteoporosis without current pathological fracture: Secondary | ICD-10-CM

## 2016-10-11 MED ORDER — HYDROCODONE-ACETAMINOPHEN 5-325 MG PO TABS: 1.0000 | ORAL_TABLET | Freq: Four times a day (QID) | ORAL | 0 refills | Status: DC | PRN

## 2016-10-11 MED ORDER — DENOSUMAB 60 MG/ML ~~LOC~~ SOLN
60.0000 mg | Freq: Once | SUBCUTANEOUS | Status: AC
  Administered 2016-10-11: 60 mg via SUBCUTANEOUS

## 2016-10-11 NOTE — Telephone Encounter (Signed)
Patient in office for nurse visit for Prolia injection , patient wanted to advise PCP due to the pain in her back she could not lay flat for the MRI and was unable to complete MRI on 10/08/16 , patient still in pain today rated at 6 -7 on numeric pain scale of 0-10. Patient ask if MRI could be rescheduled and she could have something for pain prior to the MRI so that she could ly flat for extended amount of time, patient staed her pain is really bad or she would not ask, " I tried to be brave , but I could not take the pain" advised patient I would notify MD, Patient last OV for this problem 09/27/16.

## 2016-10-11 NOTE — Telephone Encounter (Signed)
Have placed script at front desk for patient to pick up . Advised patient to take 1 hour before MRI to help with pain to enable patient to lie still for testing.

## 2016-10-11 NOTE — Telephone Encounter (Signed)
Can we reschedule MRI?

## 2016-10-11 NOTE — Progress Notes (Signed)
Patient presented for 6 month Prolia injection to Right arm, given SQ for post menopausal osteoporosis, patient voiced no concern to injection.

## 2016-10-11 NOTE — Telephone Encounter (Signed)
If she can tolerate hydrocodone, I have written an rx for #30 .

## 2016-10-11 NOTE — Telephone Encounter (Signed)
YES PLEASE REORDER

## 2016-10-12 ENCOUNTER — Telehealth: Payer: Self-pay | Admitting: Internal Medicine

## 2016-10-12 MED ORDER — CYCLOBENZAPRINE HCL 5 MG PO TABS: 5.0000 mg | ORAL_TABLET | Freq: Three times a day (TID) | ORAL | 1 refills | Status: DC | PRN

## 2016-10-12 NOTE — Telephone Encounter (Signed)
Pt called and stated that she is not going to take this medication because it is to stronger. She is looking for something more like a muscle relaxer to help her get through the MRI. Please advise, thank you!  Call pt @ [phone]

## 2016-10-12 NOTE — Telephone Encounter (Signed)
ALL MEDICATIONS THAT CAN BE PRESCRIBED will have  HARSH SIDE EFFECTS listed, please warn her of that .  I WILL PRESCRIBE FLEXERIL SINCE SHE HAS REQUESTED IT,

## 2016-10-12 NOTE — Telephone Encounter (Signed)
Spoke with both the pt and the pt's daughter and they stated that the pt has been given two different medications to try to help with her pain but that the pt is not going to take them due to the "harsh" side effects they have. The pt went to have her MRI done and wasn't able to complete it because the position she was laying caused severe pain. The pt is wanting to know if there is some kind of injection or a muscle relaxer that she can take right before the MRI that will allow her to relax long enough to get the MRI done?

## 2016-10-13 ENCOUNTER — Ambulatory Visit: Payer: Medicare Other

## 2016-10-14 NOTE — Telephone Encounter (Signed)
Spoke with Natalie Coleman and informed her that everything that Dr. Darrick Huntsmanullo can prescribe her to help with her pain will have harsh side effects listed. Also informed the Natalie Coleman that flexeril was sent in for her to take before she has the MRI. Natalie Coleman gave a verbal understanding and stated that she would go by and get the medication today.

## 2016-10-15 ENCOUNTER — Ambulatory Visit
Admission: RE | Admit: 2016-10-15 | Discharge: 2016-10-15 | Disposition: A | Discharge status: Home / Self Care | Payer: Medicare Other | Source: Ambulatory Visit | Attending: Internal Medicine | Admitting: Internal Medicine

## 2016-10-15 DIAGNOSIS — M5136 Other intervertebral disc degeneration, lumbar region: Secondary | ICD-10-CM | POA: Diagnosis present

## 2016-10-15 DIAGNOSIS — M4316 Spondylolisthesis, lumbar region: Secondary | ICD-10-CM | POA: Insufficient documentation

## 2016-10-15 DIAGNOSIS — M5126 Other intervertebral disc displacement, lumbar region: Secondary | ICD-10-CM | POA: Diagnosis not present

## 2016-10-15 DIAGNOSIS — M48061 Spinal stenosis, lumbar region without neurogenic claudication: Secondary | ICD-10-CM | POA: Insufficient documentation

## 2016-10-15 DIAGNOSIS — M5127 Other intervertebral disc displacement, lumbosacral region: Secondary | ICD-10-CM | POA: Diagnosis not present

## 2016-10-15 DIAGNOSIS — M810 Age-related osteoporosis without current pathological fracture: Secondary | ICD-10-CM | POA: Diagnosis present

## 2016-10-15 DIAGNOSIS — M5416 Radiculopathy, lumbar region: Secondary | ICD-10-CM | POA: Diagnosis present

## 2016-10-20 ENCOUNTER — Other Ambulatory Visit: Payer: Self-pay | Admitting: Internal Medicine

## 2016-10-20 DIAGNOSIS — M48061 Spinal stenosis, lumbar region without neurogenic claudication: Secondary | ICD-10-CM

## 2016-11-04 ENCOUNTER — Ambulatory Visit: Payer: Medicare Other | Admitting: Internal Medicine

## 2016-11-08 ENCOUNTER — Ambulatory Visit (INDEPENDENT_AMBULATORY_CARE_PROVIDER_SITE_OTHER): Payer: Medicare Other | Admitting: Internal Medicine

## 2016-11-08 ENCOUNTER — Encounter: Payer: Self-pay | Admitting: Internal Medicine

## 2016-11-08 VITALS — BP 122/84 | HR 71 | Temp 98.3°F | Ht <= 58 in | Wt 107.4 lb

## 2016-11-08 DIAGNOSIS — M48062 Spinal stenosis, lumbar region with neurogenic claudication: Secondary | ICD-10-CM

## 2016-11-08 DIAGNOSIS — M810 Age-related osteoporosis without current pathological fracture: Secondary | ICD-10-CM | POA: Diagnosis not present

## 2016-11-08 DIAGNOSIS — M5416 Radiculopathy, lumbar region: Secondary | ICD-10-CM
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
## 2016-11-08 NOTE — Patient Instructions (Addendum)
You have spinal stenosis  Affecting the lower (lumbar ) spine   You can take 3 tylenol daily safely long term for your back pain   I encourage  you to walk for 15 mintues aily ,  And icnranesnd as tolerated  Referral to the [facility] because of your spinal stenosis and treatment options   I agree with stopping the Prolia.  I will make a referral to an osteoporosis specialist  After we repeat your boen density test in January o decide on the next treatmnent.       Spinal Stenosis Spinal stenosis happens when the open space (spinal canal) between the bones of your spine (vertebrae) gets smaller. It is caused by bone pushing into the open spaces of your backbone (spine). This puts pressure on your backbone and the nerves in your backbone. Treatment often focuses on managing any pain and symptoms. In some cases, surgery may be needed. Follow these instructions at home: Managing pain, stiffness, and swelling  Do all exercises and stretches as told by your doctor.  Stand and sit up straight (use good posture). If you were given a brace or a corset, wear it as told by your doctor.  Do not do any activities that cause pain. Ask your doctor what activities are safe for you.  Do not lift anything that is heavier than 10 lb (4.5 kg) or heavier than your doctor tells you.  Try to stay at a healthy weight. Talk with your doctor if you need help losing weight.  If directed, put heat on the affected area as often as told by your doctor. Use the heat source that your doctor recommends, such as a moist heat pack or a heating pad. ? Put a towel between your skin and the heat source. ? Leave the heat on for 20-30 minutes. ? Remove the heat if your skin turns bright red. This is especially important if you are not able to feel pain, heat, or cold. You may have a greater risk of getting burned. General instructions  Take over-the-counter and prescription medicines only as told by your  doctor.  Do not use any products that contain nicotine or tobacco, such as cigarettes and e-cigarettes. If you need help quitting, ask your doctor.  Eat a healthy diet. This includes plenty of fruits and vegetables, whole grains, and low-fat (lean) protein.  Keep all follow-up visits as told by your doctor. This is important. Contact a doctor if:  Your symptoms do not get better.  Your symptoms get worse.  You have a fever. Get help right away if:  You have new or worse pain in your neck or upper back.  You have very bad pain that medicine does not control.  You are dizzy.  You have vision problems, blurred vision, or double vision.  You have a very bad headache that is worse when you stand.  You feel sick to your stomach (nauseous).  You throw up (vomit).  You have new or worse numbness or tingling in your back or legs.  You have pain, redness, swelling, or warmth in your arm or leg. Summary  Spinal stenosis happens when the open space (spinal canal) between the bones of your spine gets smaller (narrow).  Contact a doctor if your symptoms get worse.  In some cases, surgery may be needed. This information is not intended to replace advice given to you by your health care provider. Make sure you discuss any questions you have with your  health care provider. Document Released: 05/28/2010 Document Revised: 01/07/2016 Document Reviewed: 01/07/2016 Elsevier Interactive Patient Education  2017 [employer].

## 2016-11-09 NOTE — Assessment & Plan Note (Signed)
Managed with tylenol due ot her refusal to try the other medications offered.  .  Referral to Pain clinic.

## 2016-11-09 NOTE — Assessment & Plan Note (Signed)
She is not tolerating Prolia. Advised her to stop having the injections ,  To consider Evista.  Will repeat her DEXA in January and refer to endocrinology if  T scores have worsened.

## 2016-11-10 NOTE — Telephone Encounter (Signed)
Orders

## 2016-11-18 ENCOUNTER — Ambulatory Visit: Payer: Medicare Other | Admitting: Internal Medicine

## 2017-02-01 ENCOUNTER — Ambulatory Visit: Payer: Medicare Other

## 2017-03-07 ENCOUNTER — Other Ambulatory Visit: Payer: Medicare Other

## 2017-03-18 ENCOUNTER — Telehealth: Payer: Self-pay | Admitting: Internal Medicine

## 2017-03-18 NOTE — Telephone Encounter (Signed)
Talked with patient she is due 04/13/17 for prolia I have submitted verification for insurance on [employer] portal.

## 2017-03-18 NOTE — Telephone Encounter (Signed)
Spoke with Mrs. Matarese today. She has questions regarding her prolia injection and would like for someone to give her a call back. SF

## 2017-04-05 NOTE — Telephone Encounter (Signed)
Prolia scheduled for 04/14/17

## 2017-04-06 ENCOUNTER — Ambulatory Visit: Payer: Medicare Other

## 2017-04-14 ENCOUNTER — Ambulatory Visit: Payer: Medicare Other

## 2017-04-15 ENCOUNTER — Telehealth: Payer: Self-pay

## 2017-04-15 NOTE — Telephone Encounter (Signed)
Scheduled Monday  

## 2017-04-15 NOTE — Telephone Encounter (Signed)
Copied from CRM [phone]. Topic: Appointment Scheduling - Scheduling Inquiry for Clinic >> Apr 15, 2017  9:55 AM Oneal GroutSebastian, Jennifer S wrote: Reason for CRM: Patient needs prolia injection, was scheduled for 04/14/17. Missed appt, requesting to be see asap. Able to work in? No nurse appts available till 04/25/17, states she can't wait till then, needs shot now.

## 2017-04-15 NOTE — Telephone Encounter (Signed)
Pt missed her nurse visit yesterday for her prolia injection. Pt states that she can not wait until 04/25/2017. Is there anywhere that we can work her in?

## 2017-04-18 ENCOUNTER — Ambulatory Visit (INDEPENDENT_AMBULATORY_CARE_PROVIDER_SITE_OTHER): Payer: Medicare Other | Admitting: *Deleted

## 2017-04-18 DIAGNOSIS — M810 Age-related osteoporosis without current pathological fracture: Secondary | ICD-10-CM | POA: Diagnosis not present

## 2017-04-18 MED ORDER — DENOSUMAB 60 MG/ML ~~LOC~~ SOLN
60.0000 mg | Freq: Once | SUBCUTANEOUS | Status: AC
Start: 2017-04-18 — End: 2017-04-18
  Administered 2017-04-18: 60 mg via SUBCUTANEOUS

## 2017-04-18 NOTE — Progress Notes (Addendum)
Patient presented for Prolia injection to left arm Natalie Coleman, patient voiced no concerns or complaints during or after injection.    I have reviewed the above information and agree with above.   Duncan Dulleresa Tullo, MD

## 2017-05-03 ENCOUNTER — Ambulatory Visit (INDEPENDENT_AMBULATORY_CARE_PROVIDER_SITE_OTHER): Payer: Medicare Other

## 2017-05-03 VITALS — BP 122/64 | HR 65 | Temp 98.0°F | Resp 14 | Ht <= 58 in | Wt 112.0 lb

## 2017-05-03 DIAGNOSIS — Z0000 Encounter for general adult medical examination without abnormal findings: Secondary | ICD-10-CM | POA: Diagnosis not present

## 2017-05-03 NOTE — Patient Instructions (Addendum)
  Ms. Sheran FavaBittmann , Thank you for taking time to come for your Medicare Wellness Visit. I appreciate your ongoing commitment to your health goals. Please review the following plan we discussed and let me know if I can assist you in the future.   Follow up with Dr. Darrick Huntsmanullo as needed.    Have a great day!  These are the goals we discussed: Goals    . Increase physical activity     Stretch! Chair/standing exercises daily.    Get involved with the [facility] and/or [employer] program    . REDUCE SUGAR INTAKE     Low carb foods       This is a list of the screening recommended for you and due dates:  Health Maintenance  Topic Date Due  . Tetanus Vaccine  08/16/2014  . Flu Shot  01/03/2018*  . DEXA scan (bone density measurement)  Completed  . Pneumonia vaccines  Completed  *Topic was postponed. The date shown is not the original due date.

## 2017-05-03 NOTE — Progress Notes (Addendum)
Subjective:   Natalie Coleman is a 77 y.o. female who presents for Medicare Annual (Subsequent) preventive examination.  Review of Systems:  No ROS.  Medicare Wellness Visit. Additional risk factors are reflected in the social history.  Cardiac Risk Factors include: advanced age (>50men, >51 women)     Objective:     Vitals: BP 122/64 (BP Location: Left Arm, Patient Position: Sitting, Cuff Size: Normal)   Pulse 65   Temp 98 F (36.7 C) (Oral)   Resp 14   Ht 4' 6.2" (1.377 m)   Wt 112 lb (50.8 kg)   SpO2 98%   BMI 26.81 kg/m   Body mass index is 26.81 kg/m.  Advanced Directives 05/03/2017 02/02/2016 12/30/2014  Does Patient Have a Medical Advance Directive? No No No  Would patient like information on creating a medical advance directive? No - Patient declined No - Patient declined Yes - Transport planner given    Tobacco Social History   Tobacco Use  Smoking Status Never Smoker  Smokeless Tobacco Never Used     Counseling given: Not Answered   Clinical Intake:  Pre-visit preparation completed: Yes  Pain : No/denies pain     Nutritional Status: BMI 25 -29 Overweight Diabetes: No  How often do you need to have someone help you when you read instructions, pamphlets, or other written materials from your doctor or pharmacy?: 1 - Never  Interpreter Needed?: No     Past Medical History:  Diagnosis Date  . Colon polyps   . GERD (gastroesophageal reflux disease)   . Hemorrhoids   . History of blood transfusion   . Osteoporosis    Past Surgical History:  Procedure Laterality Date  . CESAREAN SECTION  [id number]  . CHOLECYSTECTOMY  2008  . COLONOSCOPY  2009  . COLONOSCOPY WITH PROPOFOL N/A 12/10/2015   Procedure: COLONOSCOPY WITH PROPOFOL;  Surgeon: Earline Mayotte, MD;  Location: [facility] ENDOSCOPY;  Service: Endoscopy;  Laterality: N/A;  . EYE SURGERY Right   . FRACTURE SURGERY     right tibia  . ORIF ANKLE FRACTURE BIMALLEOLAR  March 2006  .  ORIF TIBIA & FIBULA FRACTURES  March 2006   Family History  Problem Relation Age of Onset  . Hypertension Father   . Hypertension Mother   . Hypertension Sister   . Breast cancer Cousin   . Cancer Neg Hx    Social History   Socioeconomic History  . Marital status: Divorced    Spouse name: None  . Number of children: None  . Years of education: None  . Highest education level: None  Social Needs  . Financial resource strain: Not hard at all  . Food insecurity - worry: Never true  . Food insecurity - inability: Never true  . Transportation needs - medical: No  . Transportation needs - non-medical: No  Occupational History  . None  Tobacco Use  . Smoking status: Never Smoker  . Smokeless tobacco: Never Used  Substance and Sexual Activity  . Alcohol use: Yes    Alcohol/week: 0.6 oz    Types: 1 Glasses of wine per week    Comment: ocassional  . Drug use: No  . Sexual activity: No  Other Topics Concern  . None  Social History Narrative  . None    Outpatient Encounter Medications as of 05/03/2017  Medication Sig  . ammonium lactate (AMLACTIN) 12 % lotion Apply 1 application topically as needed for dry skin.  Marland Kitchen aspirin 81 MG tablet  Take 81 mg by mouth daily.  . Biotin 5000 MCG CAPS Take 1 capsule by mouth daily.  . Calcium Citrate 250 MG TABS Take 2 tablets by mouth 3 (three) times daily.  . Cholecalciferol (VITAMIN D3) 1000 UNITS CAPS Take 2,000 Units by mouth daily.   . Coenzyme Q10 100 MG TABS Take 1 tablet by mouth daily.  Marland Kitchen denosumab (PROLIA) 60 MG/ML SOLN injection Inject 60 mg into the skin every 6 (six) months. Administer in upper arm, thigh, or abdomen  . GARLIC PO Take 1 capsule by mouth daily.  . Lactobacillus-Inulin (CULTURELLE DIGESTIVE HEALTH) CAPS Take 1 capsule by mouth daily.  . Magnesium 250 MG TABS Take 1 tablet by mouth daily.  . Melatonin 5 MG TABS Take 1 tablet by mouth as needed.  . Multiple Vitamins-Minerals (CENTRUM SILVER PO) Take 1 tablet by  mouth daily.  . Tdap (BOOSTRIX) 5-2.5-18.5 LF-MCG/0.5 injection Inject 0.5 mLs into the muscle once.  Marland Kitchen VALERIAN ROOT PO Take 1 capsule by mouth as needed.  . vitamin B-12 (CYANOCOBALAMIN) 1000 MCG tablet Take 5,000 mcg by mouth daily.   . vitamin C (ASCORBIC ACID) 500 MG tablet Take 500 mg by mouth daily.  . [DISCONTINUED] diclofenac sodium (VOLTAREN) 1 % GEL Apply 2 g topically 4 (four) times daily.  . [DISCONTINUED] gabapentin (NEURONTIN) 100 MG capsule Take 1 capsule (100 mg total) by mouth 3 (three) times daily.   No facility-administered encounter medications on file as of 05/03/2017.     Activities of Daily Living In your present state of health, do you have any difficulty performing the following activities: 05/03/2017  Hearing? N  Vision? N  Difficulty concentrating or making decisions? Y  Comment Difficulty with short term memory; recall  Walking or climbing stairs? Y  Comment unsteady gait  Dressing or bathing? N  Doing errands, shopping? N  Preparing Food and eating ? N  Using the Toilet? N  In the past six months, have you accidently leaked urine? N  Do you have problems with loss of bowel control? N  Managing your Medications? N  Managing your Finances? N  Housekeeping or managing your Housekeeping? N  Some recent data might be hidden    Patient Care Team: Sherlene Shams, MD as PCP - General (Internal Medicine) Lemar Livings, Merrily Pew, MD (General Surgery) Sherlene Shams, MD (Internal Medicine)    Assessment:   This is a routine wellness examination for Natalie Coleman. The goal of the wellness visit is to assist the patient how to close the gaps in care and create a preventative care plan for the patient.   The roster of all physicians providing medical care to patient is listed in the Snapshot section of the chart.  Taking calcium VIT D as appropriate/Osteoporosis reviewed.   Prolia injections currently administered every 6 months.  Safety issues reviewed; Smoke and  carbon monoxide detectors in the home. No firearms or firearms locked in a safe within the home. Wears seatbelts when driving or riding with others. No violence in the home.  They do not have excessive sun exposure.  Discussed the need for sun protection: hats, long sleeves and the use of sunscreen if there is significant sun exposure.  Patient is alert, normal appearance, oriented to person/place/and time.  Correctly identified the president of the [place] and recalls of 1/3 words. Performs simple calculations and can read correct time from watch face. Displays appropriate judgement.  No new identified risk were noted.  No failures at ADL's or  IADL's.    BMI- discussed the importance of a healthy diet, water intake and the benefits of aerobic exercise. Educational material provided.   24 hour diet recall: Regular diet.  Low carb diet encouraged,  Dental- every 6 months.  Eye- Visual acuity not assessed per patient preference since they have regular follow up with the ophthalmologist.  Wears corrective lenses.  Sleep patterns- Sleeps 5-6 hours at night.   Influenza vaccine declined.   TDAP vaccine deferred per patient preference.  Follow up with insurance.  Educational material provided.  Depression- follow up scheduled with PCP.  Tearful throughout the visit.  States she feels overwhelmed with many events over the last 1 year, is not resting well and does not leave her home often anymore. States she feels sad and overwhelmed nearly every day. She does speak with her daughter and sister on the phone daily and denies harm to self or others.  Deferred to PCP for follow up.  Appointment scheduled.   Exercise Activities and Dietary recommendations Current Exercise Habits: Home exercise routine, Type of exercise: stretching(Chair exercises), Time (Minutes): 10, Intensity: Mild  Goals    . Increase physical activity     Stretch! Chair/standing exercises daily.    Get involved with the  [facility] and/or [employer] program    . REDUCE SUGAR INTAKE     Low carb foods       Fall Risk Fall Risk  05/03/2017 02/02/2016 01/14/2015 01/13/2015 12/30/2014  Falls in the past year? No No No No No   Depression Screen PHQ 2/9 Scores 05/03/2017 02/02/2016 01/14/2015 01/13/2015  PHQ - 2 Score 2 0 0 0  PHQ- 9 Score 9 - - -     Cognitive Function MMSE - Mini Mental State Exam 12/30/2014  Orientation to time 5  Orientation to Place 5  Registration 3  Attention/ Calculation 5  Recall 3  Language- name 2 objects 2  Language- repeat 1  Language- follow 3 step command 3  Language- read & follow direction 1  Write a sentence 1  Copy design 1  Total score 30     6CIT Screen 05/03/2017  What Year? 0 points  What month? 0 points  What time? 0 points  Count back from 20 0 points  Months in reverse 0 points  Repeat phrase 0 points  Total Score 0    Immunization History  Administered Date(s) Administered  . Pneumococcal Conjugate-13 08/15/2012  . Pneumococcal Polysaccharide-23 08/17/2004, 01/13/2015  . Td 08/15/2004  . Zoster 01/18/2014    Screening Tests Health Maintenance  Topic Date Due  . TETANUS/TDAP  08/16/2014  . INFLUENZA VACCINE  01/03/2018 (Originally 09/15/2016)  . DEXA SCAN  Completed  . PNA vac Low Risk Adult  Completed      Plan:   End of life planning; Advanced aging; Advanced directives discussed.  No HCPOA/Living Will.  Additional information declined at this time.  I have personally reviewed and noted the following in the patient's chart:   . Medical and social history . Use of alcohol, tobacco or illicit drugs  . Current medications and supplements . Functional ability and status . Nutritional status . Physical activity . Advanced directives . List of other physicians . Hospitalizations, surgeries, and ER visits in previous 12 months . Vitals . Screenings to include cognitive, depression, and falls . Referrals and  appointments  In addition, I have reviewed and discussed with patient certain preventive protocols, quality metrics, and best practice recommendations. A written  personalized care plan for preventive services as well as general preventive health recommendations were provided to patient.     Natalie Coleman, Natalie Brashear L, LPN  03/05/1476     I have reviewed the above information and agree with above.   Duncan Dull, MD

## 2017-05-04 NOTE — Progress Notes (Signed)
  I have reviewed the above information and agree with above.   Gennie Dib, MD 

## 2017-05-12 ENCOUNTER — Encounter: Payer: Self-pay | Admitting: Internal Medicine

## 2017-05-12 ENCOUNTER — Ambulatory Visit: Payer: Medicare Other | Admitting: Internal Medicine

## 2017-05-12 DIAGNOSIS — F411 Generalized anxiety disorder: Secondary | ICD-10-CM

## 2017-05-12 DIAGNOSIS — M5416 Radiculopathy, lumbar region: Secondary | ICD-10-CM | POA: Diagnosis not present

## 2017-05-12 MED ORDER — ESCITALOPRAM OXALATE 5 MG PO TABS: 5.0000 mg | ORAL_TABLET | Freq: Every day | ORAL | 0 refills | Status: DC

## 2017-05-12 NOTE — Progress Notes (Signed)
Subjective:  Patient ID: Natalie Coleman, female    DOB: [birth year]  Age: 77 y.o. MRN: [id number]  CC: Diagnoses of Anxiety state and Chronic lumbar radiculopathy were pertinent to this visit.  HPI Natalie Coleman presents for treatment of worsenig depression.  Patient last seen by me in September  24 for severe back pain with lumbar spinal stenosis noted on MRI.  Patient deferred neurosurgical referral  Was referred to Pain Clinic. States that her pain is better,  She is sleeping on her sofa until she can buy an adjustable bed.   Using tylenol  prn and rare motrin .      She was referred by [facility] when patient displayed symptoms of worsening depression during her wellness visit last week .  Triggered by her daughter moving to [employer] when she found employment  4 months ago, Patient is not  sleeping at night,  House is "not in order" because of house repairs that are ongoing ,  She is also  worried about her memory, thinks she is losing her memory.   She is "too picky " to hire a house cleaner.  Back pain is aggravated by vacuuming .  Also upset and concerned about current events here and in [place]. 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   MMSE 26/30.      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  Does not want to take medication    Outpatient Medications Prior to Visit  Medication Sig Dispense Refill  . ammonium lactate (AMLACTIN) 12 % lotion Apply 1 application topically as needed for dry skin. 400 g 0  . aspirin 81 MG tablet Take 81 mg by mouth daily.    . Biotin 5000 MCG CAPS Take 1 capsule by mouth daily.    . Calcium Citrate 250 MG TABS Take 2 tablets by mouth 3 (three) times daily.    . Cholecalciferol (VITAMIN D3) 1000 UNITS CAPS Take 2,000 Units by mouth daily.     . Coenzyme Q10 100 MG TABS Take 1 tablet by mouth daily.    Marland Kitchen denosumab (PROLIA) 60 MG/ML SOLN injection Inject 60 mg into the skin every 6 (six) months. Administer  in upper arm, thigh, or abdomen    . GARLIC PO Take 1 capsule by mouth daily.    . Lactobacillus-Inulin (CULTURELLE DIGESTIVE HEALTH) CAPS Take 1 capsule by mouth daily. 14 capsule 0  . Magnesium 250 MG TABS Take 1 tablet by mouth daily.    . Melatonin 5 MG TABS Take 1 tablet by mouth as needed.    . Multiple Vitamins-Minerals (CENTRUM SILVER PO) Take 1 tablet by mouth daily.    . Tdap (BOOSTRIX) 5-2.5-18.5 LF-MCG/0.5 injection Inject 0.5 mLs into the muscle once. 0.5 mL 0  . VALERIAN ROOT PO Take 1 capsule by mouth as needed.    . vitamin B-12 (CYANOCOBALAMIN) 1000 MCG tablet Take 5,000 mcg by mouth daily.     . vitamin C (ASCORBIC ACID) 500 MG tablet Take 500 mg by mouth daily.     No facility-administered medications prior to visit.     Review of Systems;  Patient denies headache, fevers, malaise, unintentional weight loss, skin rash, eye pain, sinus congestion and sinus pain, sore throat, dysphagia,  hemoptysis , cough, dyspnea, wheezing, chest pain, palpitations, orthopnea, edema, abdominal pain, nausea, melena, diarrhea, constipation, flank pain, dysuria, hematuria, urinary  Frequency, nocturia, numbness, tingling, seizures,  Focal weakness, Loss of consciousness,  Tremor, insomnia, depression, anxiety, and suicidal ideation.      Objective:  BP 128/78 (BP Location: Left Arm, Patient Position: Sitting, Cuff Size: Normal)   Pulse 64   Temp 97.9 F (36.6 C) (Oral)   Resp 14   Ht 4' 6.2" (1.377 m)   Wt 113 lb 6.4 oz (51.4 kg)   SpO2 96%   BMI 27.14 kg/m   BP Readings from Last 3 Encounters:  05/12/17 128/78  05/03/17 122/64  11/08/16 122/84    Wt Readings from Last 3 Encounters:  05/12/17 113 lb 6.4 oz (51.4 kg)  05/03/17 112 lb (50.8 kg)  11/08/16 107 lb 6.4 oz (48.7 kg)    General appearance: alert, cooperative and appears stated age Ears: normal TM's and external ear canals both ears Throat: lips, mucosa, and tongue normal; teeth and gums normal Neck: no  adenopathy, no carotid bruit, supple, symmetrical, trachea midline and thyroid not enlarged, symmetric, no tenderness/mass/nodules Back: symmetric, no curvature. ROM normal. No CVA tenderness. Lungs: clear to auscultation bilaterally Heart: regular rate and rhythm, S1, S2 normal, no murmur, click, rub or gallop Abdomen: soft, non-tender; bowel sounds normal; no masses,  no organomegaly Pulses: 2+ and symmetric Skin: Skin color, texture, turgor normal. No rashes or lesions Lymph nodes: Cervical, supraclavicular, and axillary nodes normal. Psych: affect normal, makes good eye contact. No fidgeting,   Cries easily.  Denies suicidal thoughts   No results found for: HGBA1C  Lab Results  Component Value Date   CREATININE 0.81 08/19/2016   CREATININE 0.73 01/16/2016   CREATININE 0.70 12/30/2014    Lab Results  Component Value Date   WBC 7.1 01/16/2016   HGB 13.2 01/16/2016   HCT 38.8 01/16/2016   PLT 248.0 01/16/2016   GLUCOSE 85 08/19/2016   CHOL 212 (H) 08/19/2016   TRIG 146.0 08/19/2016   HDL 56.50 08/19/2016   LDLDIRECT 115.0 01/16/2016   LDLCALC 126 (H) 08/19/2016   ALT 15 08/19/2016   AST 17 08/19/2016   NA 140 08/19/2016   K 4.4 08/19/2016   CL 104 08/19/2016   CREATININE 0.81 08/19/2016   BUN 31 (H) 08/19/2016   CO2 29 08/19/2016   TSH 0.98 01/16/2016    Mr Lumbar Spine Wo Contrast  Result Date: 10/15/2016 CLINICAL DATA:  Low back pain radiating to the left leg. EXAM: MRI LUMBAR SPINE WITHOUT CONTRAST TECHNIQUE: Multiplanar, multisequence MR imaging of the lumbar spine was performed. No intravenous contrast was administered. COMPARISON:  None. FINDINGS: Segmentation:  Standard. Alignment: There is 7 mm anterolisthesis of L4 on L5 and 5 mm retrolisthesis of L5 on S1 Vertebrae: No fracture, evidence of discitis, or bone lesion. Schmorl's nodes involving the superior endplates of L2 and L4. Conus medullaris: Extends to the L1-L2 level and appears normal. Paraspinal and other  soft tissues: Small right renal cysts. Sigmoid diverticulosis. Degenerative changes of the bilateral sacroiliac joints. Disc levels: T10-T11: Only seen on the sagittal images. Small central disc protrusion without spinal canal or neuroforaminal stenosis. T11-T12: Only seen on the sagittal images. Small diffuse disc bulge without significant spinal canal or neuroforaminal stenosis. T12-L1: Small central disc protrusion without spinal canal or neuroforaminal stenosis. L1-L2: Diffuse disc bulge without significant spinal canal or neuroforaminal stenosis. L2-L3: Diffuse disc bulge and bilateral facet arthropathy with ligamentum flavum hypertrophy resulting in moderate central spinal canal stenosis. No significant neuroforaminal stenosis. L3-L4: Diffuse disc bulge, asymmetric to the right, and bilateral facet arthropathy with ligamentum flavum hypertrophy resulting in moderate central spinal canal stenosis  and mild right neuroforaminal stenosis. No significant left neuroforaminal stenosis. L4-L5: Disc uncovering and small diffuse disc bulge with severe bilateral facet arthropathy resulting in severe central spinal canal stenosis and moderate right neuroforaminal stenosis. No significant left neuroforaminal stenosis. L5-S1: Diffuse disc bulge with superimposed central disc extrusion migrating inferiorly. The disc extrusion abuts the descending left S1 nerve root. There is mild central spinal canal stenosis and severe bilateral neuroforaminal stenosis with likely impingement on the bilateral exiting L5 nerve roots. IMPRESSION: 1. Multilevel degenerative changes of the lumbar spine, as described above, worst from L2-L3 through L5-S1. 2. Severe central spinal canal stenosis and moderate right neuroforaminal stenosis at L4-L5. 3. Severe bilateral neuroforaminal stenosis at L5-S1 likely impinging on the bilateral exiting L5 nerve roots. A central disc extrusion migrating inferiorly at this level also likely abuts the  descending left S1 nerve root. 4. Moderate central spinal canal stenosis at L2-L3 and L3-L4. 5. Grade 1 anterolisthesis at L4-L5 due to severe facet arthropathy. Electronically Signed   By: Obie Dredge M.D.   On: 10/15/2016 08:53    Assessment & Plan:   Problem List Items Addressed This Visit    Chronic lumbar radiculopathy    Managed with tylenol due to her refusal to try the other medications offered.  . Pain is currentl controlled without opiates       Relevant Medications   escitalopram (LEXAPRO) 5 MG tablet   Anxiety state    A total of 25 minutes was spent with patient more than half of which was spent in counseling patient on  Her effect of her untreated anxiety on her physical and emotional state. She is finally receptive to the idea of a trial of lexapro       Relevant Medications   escitalopram (LEXAPRO) 5 MG tablet      I am having Natalie Coleman start on escitalopram. I am also having her maintain her Multiple Vitamins-Minerals (CENTRUM SILVER PO), GARLIC PO, Vitamin D3, vitamin C, vitamin B-12, aspirin, VALERIAN ROOT PO, Melatonin, CULTURELLE DIGESTIVE HEALTH, Magnesium, Biotin, Coenzyme Q10, Tdap, denosumab, ammonium lactate, and Calcium Citrate.  Meds ordered this encounter  Medications  . escitalopram (LEXAPRO) 5 MG tablet    Sig: Take 1 tablet (5 mg total) by mouth daily.    Dispense:  90 tablet    Refill:  0    There are no discontinued medications.  Follow-up: Return in about 5 weeks (around 06/16/2017), or anxiety, depression , for an.   Sherlene Shams, MD

## 2017-05-12 NOTE — Patient Instructions (Addendum)
   I recommend hiring someone to vacuum your house and help move the clutter,  And leave the easy cleaning to you   I recommend that you try a medication for your anxiety called Lexapro  Please start the Lexapro (escitalopram) at 1/2 tablet daily in the evening  After dinner for the first few week to avoid nausea.  You can increase to a full tablet after 1 week  if you havenot developed side effects of nausea.  Use a "pillcutter"  To cut the pills for the first week.  If the lexapro interferes with your sleep, take it in the morning instead  Please return in  5 weeks .   DO NOT BE ANXIOUS.  IT IS NOT BIBLICALLY ADVISED!!!  READ  THE BEATITUDES .Marland Kitchen.    Remember what Renae Fickleaul said:    "Be anxious for nothing,  But in everything , by prayer and supplication , let your request by made known to God. And the peace of God which surpasses all understanding, shall guard your hearts and minds through [employer]."

## 2017-05-14 NOTE — Assessment & Plan Note (Signed)
A total of 25 minutes was spent with patient more than half of which was spent in counseling patient on  Her effect of her untreated anxiety on her physical and emotional state. She is finally receptive to the idea of a trial of lexapro

## 2017-05-14 NOTE — Assessment & Plan Note (Signed)
Managed with tylenol due to her refusal to try the other medications offered.  . Pain is currentl controlled without opiates

## 2017-06-16 ENCOUNTER — Ambulatory Visit: Payer: Medicare Other | Admitting: Internal Medicine

## 2017-06-23 ENCOUNTER — Encounter: Payer: Medicare Other | Admitting: Internal Medicine

## 2017-07-14 ENCOUNTER — Encounter: Payer: Self-pay | Admitting: Internal Medicine

## 2017-07-14 ENCOUNTER — Ambulatory Visit: Payer: Medicare Other | Admitting: Internal Medicine

## 2017-07-14 VITALS — BP 120/76 | HR 75 | Temp 97.6°F | Resp 15 | Ht <= 58 in | Wt 108.8 lb

## 2017-07-14 DIAGNOSIS — R5383 Other fatigue: Secondary | ICD-10-CM

## 2017-07-14 DIAGNOSIS — Z0000 Encounter for general adult medical examination without abnormal findings: Secondary | ICD-10-CM

## 2017-07-14 DIAGNOSIS — R351 Nocturia: Secondary | ICD-10-CM

## 2017-07-14 DIAGNOSIS — M5416 Radiculopathy, lumbar region: Secondary | ICD-10-CM | POA: Diagnosis not present

## 2017-07-14 DIAGNOSIS — E785 Hyperlipidemia, unspecified: Secondary | ICD-10-CM | POA: Diagnosis not present

## 2017-07-14 DIAGNOSIS — M48061 Spinal stenosis, lumbar region without neurogenic claudication: Secondary | ICD-10-CM | POA: Diagnosis not present

## 2017-07-14 DIAGNOSIS — E559 Vitamin D deficiency, unspecified: Secondary | ICD-10-CM | POA: Diagnosis not present

## 2017-07-14 DIAGNOSIS — Z1239 Encounter for other screening for malignant neoplasm of breast: Secondary | ICD-10-CM

## 2017-07-14 DIAGNOSIS — M810 Age-related osteoporosis without current pathological fracture: Secondary | ICD-10-CM

## 2017-07-14 DIAGNOSIS — Z1231 Encounter for screening mammogram for malignant neoplasm of breast: Secondary | ICD-10-CM

## 2017-07-14 DIAGNOSIS — Z0001 Encounter for general adult medical examination with abnormal findings: Secondary | ICD-10-CM

## 2017-07-14 LAB — COMPREHENSIVE METABOLIC PANEL
ALT: 16 U/L (ref 0–35)
AST: 20 U/L (ref 0–37)
Albumin: 4.6 g/dL (ref 3.5–5.2)
Alkaline Phosphatase: 40 U/L (ref 39–117)
BILIRUBIN TOTAL: 0.6 mg/dL (ref 0.2–1.2)
BUN: 23 mg/dL (ref 6–23)
CO2: 28 meq/L (ref 19–32)
Calcium: 10.3 mg/dL (ref 8.4–10.5)
Chloride: 102 mEq/L (ref 96–112)
Creatinine, Ser: 0.78 mg/dL (ref 0.40–1.20)
GFR: 75.78 mL/min (ref >60.00)
GLUCOSE: 93 mg/dL (ref 70–99)
Potassium: 4.2 mEq/L (ref 3.5–5.1)
SODIUM: 138 meq/L (ref 135–145)
TOTAL PROTEIN: 7.5 g/dL (ref 6.0–8.3)

## 2017-07-14 LAB — LIPID PANEL
CHOLESTEROL: 228 mg/dL — AB (ref 0–200)
HDL: 67.5 mg/dL (ref >39.00)
LDL Cholesterol: 139 mg/dL — ABNORMAL HIGH (ref 0–99)
NONHDL: 160.71
Total CHOL/HDL Ratio: 3
Triglycerides: 108 mg/dL (ref 0.0–149.0)
VLDL: 21.6 mg/dL (ref 0.0–40.0)

## 2017-07-14 LAB — TSH: TSH: 1.18 u[IU]/mL (ref 0.35–4.50)

## 2017-07-14 LAB — CBC WITH DIFFERENTIAL/PLATELET
Basophils Absolute: 0 10*3/uL (ref 0.0–0.1)
Basophils Relative: 0.5 % (ref 0.0–3.0)
Eosinophils Absolute: 0.2 10*3/uL (ref 0.0–0.7)
Eosinophils Relative: 3.4 % (ref 0.0–5.0)
HCT: 41.3 % (ref 36.0–46.0)
Hemoglobin: 13.8 g/dL (ref 12.0–15.0)
LYMPHS ABS: 2.5 10*3/uL (ref 0.7–4.0)
Lymphocytes Relative: 34.6 % (ref 12.0–46.0)
MCHC: 33.3 g/dL (ref 30.0–36.0)
MCV: 92.7 fl (ref 78.0–100.0)
MONO ABS: 0.7 10*3/uL (ref 0.1–1.0)
MONOS PCT: 9.7 % (ref 3.0–12.0)
NEUTROS ABS: 3.7 10*3/uL (ref 1.4–7.7)
NEUTROS PCT: 51.8 % (ref 43.0–77.0)
PLATELETS: 250 10*3/uL (ref 150.0–400.0)
RBC: 4.45 Mil/uL (ref 3.87–5.11)
RDW: 13.4 % (ref 11.5–15.5)
WBC: 7.1 10*3/uL (ref 4.0–10.5)

## 2017-07-14 LAB — VITAMIN D 25 HYDROXY (VIT D DEFICIENCY, FRACTURES): VITD: 50.81 ng/mL (ref 30.00–100.00)

## 2017-07-14 NOTE — Patient Instructions (Signed)
You are doing very well!  I HAVE ORDERED YOUR MAMMOGRAM    Practice getting up from a chiar WITHOUT USING YOUR HANDS  10 times  3 sets  Stand on one leg for 10 seconds, near the kitchen counter to work on your balance.  Try to use only one finger on the counter 5 times each leg   3 sets     Health Maintenance for Postmenopausal Women Menopause is a normal process in which your reproductive ability comes to an end. This process happens gradually over a span of months to years, usually between the ages of 65 and 74. Menopause is complete when you have missed 12 consecutive menstrual periods. It is important to talk with your health care provider about some of the most common conditions that affect postmenopausal women, such as heart disease, cancer, and bone loss (osteoporosis). Adopting a healthy lifestyle and getting preventive care can help to promote your health and wellness. Those actions can also lower your chances of developing some of these common conditions. What should I know about menopause? During menopause, you may experience a number of symptoms, such as:  Moderate-to-severe hot flashes.  Night sweats.  Decrease in sex drive.  Mood swings.  Headaches.  Tiredness.  Irritability.  Memory problems.  Insomnia.  Choosing to treat or not to treat menopausal changes is an individual decision that you make with your health care provider. What should I know about hormone replacement therapy and supplements? Hormone therapy products are effective for treating symptoms that are associated with menopause, such as hot flashes and night sweats. Hormone replacement carries certain risks, especially as you become older. If you are thinking about using estrogen or estrogen with progestin treatments, discuss the benefits and risks with your health care provider. What should I know about heart disease and stroke? Heart disease, heart attack, and stroke become more likely as you age.  This may be due, in part, to the hormonal changes that your body experiences during menopause. These can affect how your body processes dietary fats, triglycerides, and cholesterol. Heart attack and stroke are both medical emergencies. There are many things that you can do to help prevent heart disease and stroke:  Have your blood pressure checked at least every 1-2 years. High blood pressure causes heart disease and increases the risk of stroke.  If you are 40-30 years old, ask your health care provider if you should take aspirin to prevent a heart attack or a stroke.  Do not use any tobacco products, including cigarettes, chewing tobacco, or electronic cigarettes. If you need help quitting, ask your health care provider.  It is important to eat a healthy diet and maintain a healthy weight. ? Be sure to include plenty of vegetables, fruits, low-fat dairy products, and lean protein. ? Avoid eating foods that are high in solid fats, added sugars, or salt (sodium).  Get regular exercise. This is one of the most important things that you can do for your health. ? Try to exercise for at least 150 minutes each week. The type of exercise that you do should increase your heart rate and make you sweat. This is known as moderate-intensity exercise. ? Try to do strengthening exercises at least twice each week. Do these in addition to the moderate-intensity exercise.  Know your numbers.Ask your health care provider to check your cholesterol and your blood glucose. Continue to have your blood tested as directed by your health care provider.  What should I know about  cancer screening? There are several types of cancer. Take the following steps to reduce your risk and to catch any cancer development as early as possible. Breast Cancer  Practice breast self-awareness. ? This means understanding how your breasts normally appear and feel. ? It also means doing regular breast self-exams. Let your health care  provider know about any changes, no matter how small.  If you are 49 or older, have a clinician do a breast exam (clinical breast exam or CBE) every year. Depending on your age, family history, and medical history, it may be recommended that you also have a yearly breast X-ray (mammogram).  If you have a family history of breast cancer, talk with your health care provider about genetic screening.  If you are at high risk for breast cancer, talk with your health care provider about having an MRI and a mammogram every year.  Breast cancer (BRCA) gene test is recommended for women who have family members with BRCA-related cancers. Results of the assessment will determine the need for genetic counseling and BRCA1 and for BRCA2 testing. BRCA-related cancers include these types: ? Breast. This occurs in males or females. ? Ovarian. ? Tubal. This may also be called fallopian tube cancer. ? Cancer of the abdominal or pelvic lining (peritoneal cancer). ? Prostate. ? Pancreatic.  Cervical, Uterine, and Ovarian Cancer Your health care provider may recommend that you be screened regularly for cancer of the pelvic organs. These include your ovaries, uterus, and vagina. This screening involves a pelvic exam, which includes checking for microscopic changes to the surface of your cervix (Pap test).  For women ages 21-65, health care providers may recommend a pelvic exam and a Pap test every three years. For women ages 75-65, they may recommend the Pap test and pelvic exam, combined with testing for human papilloma virus (HPV), every five years. Some types of HPV increase your risk of cervical cancer. Testing for HPV may also be done on women of any age who have unclear Pap test results.  Other health care providers may not recommend any screening for nonpregnant women who are considered low risk for pelvic cancer and have no symptoms. Ask your health care provider if a screening pelvic exam is right for  you.  If you have had past treatment for cervical cancer or a condition that could lead to cancer, you need Pap tests and screening for cancer for at least 20 years after your treatment. If Pap tests have been discontinued for you, your risk factors (such as having a new sexual partner) need to be reassessed to determine if you should start having screenings again. Some women have medical problems that increase the chance of getting cervical cancer. In these cases, your health care provider may recommend that you have screening and Pap tests more often.  If you have a family history of uterine cancer or ovarian cancer, talk with your health care provider about genetic screening.  If you have vaginal bleeding after reaching menopause, tell your health care provider.  There are currently no reliable tests available to screen for ovarian cancer.  Lung Cancer Lung cancer screening is recommended for adults 38-58 years old who are at high risk for lung cancer because of a history of smoking. A yearly low-dose CT scan of the lungs is recommended if you:  Currently smoke.  Have a history of at least 30 pack-years of smoking and you currently smoke or have quit within the past 15 years. A pack-year is  smoking an average of one pack of cigarettes per day for one year.  Yearly screening should:  Continue until it has been 15 years since you quit.  Stop if you develop a health problem that would prevent you from having lung cancer treatment.  Colorectal Cancer  This type of cancer can be detected and can often be prevented.  Routine colorectal cancer screening usually begins at age 83 and continues through age 36.  If you have risk factors for colon cancer, your health care provider may recommend that you be screened at an earlier age.  If you have a family history of colorectal cancer, talk with your health care provider about genetic screening.  Your health care provider may also recommend  using home test kits to check for hidden blood in your stool.  A small camera at the end of a tube can be used to examine your colon directly (sigmoidoscopy or colonoscopy). This is done to check for the earliest forms of colorectal cancer.  Direct examination of the colon should be repeated every 5-10 years until age 72. However, if early forms of precancerous polyps or small growths are found or if you have a family history or genetic risk for colorectal cancer, you may need to be screened more often.  Skin Cancer  Check your skin from head to toe regularly.  Monitor any moles. Be sure to tell your health care provider: ? About any new moles or changes in moles, especially if there is a change in a mole's shape or color. ? If you have a mole that is larger than the size of a pencil eraser.  If any of your family members has a history of skin cancer, especially at a young age, talk with your health care provider about genetic screening.  Always use sunscreen. Apply sunscreen liberally and repeatedly throughout the day.  Whenever you are outside, protect yourself by wearing long sleeves, pants, a wide-brimmed hat, and sunglasses.  What should I know about osteoporosis? Osteoporosis is a condition in which bone destruction happens more quickly than new bone creation. After menopause, you may be at an increased risk for osteoporosis. To help prevent osteoporosis or the bone fractures that can happen because of osteoporosis, the following is recommended:  If you are 52-64 years old, get at least 1,000 mg of calcium and at least 600 mg of vitamin D per day.  If you are older than age 75 but younger than age 58, get at least 1,200 mg of calcium and at least 600 mg of vitamin D per day.  If you are older than age 86, get at least 1,200 mg of calcium and at least 800 mg of vitamin D per day.  Smoking and excessive alcohol intake increase the risk of osteoporosis. Eat foods that are rich in  calcium and vitamin D, and do weight-bearing exercises several times each week as directed by your health care provider. What should I know about how menopause affects my mental health? Depression may occur at any age, but it is more common as you become older. Common symptoms of depression include:  Low or sad mood.  Changes in sleep patterns.  Changes in appetite or eating patterns.  Feeling an overall lack of motivation or enjoyment of activities that you previously enjoyed.  Frequent crying spells.  Talk with your health care provider if you think that you are experiencing depression. What should I know about immunizations? It is important that you get and maintain  your immunizations. These include:  Tetanus, diphtheria, and pertussis (Tdap) booster vaccine.  Influenza every year before the flu season begins.  Pneumonia vaccine.  Shingles vaccine.  Your health care provider may also recommend other immunizations. This information is not intended to replace advice given to you by your health care provider. Make sure you discuss any questions you have with your health care provider. Document Released: 03/26/2005 Document Revised: 08/22/2015 Document Reviewed: 11/05/2014 Elsevier Interactive Patient Education  2018 [employer].

## 2017-07-14 NOTE — Assessment & Plan Note (Addendum)
Prior pelvic fractures,  Prior alendronate therapy x 5 years,  still taking  And tolerating Prolia injections despite various complaints of jaw pain,  Pelvic pain and weakness.  All somatic symptoms have improved without intervention

## 2017-07-14 NOTE — Progress Notes (Signed)
Patient ID: Natalie Coleman, female    DOB: 1940/07/29  Age: 77 y.o. MRN: [id number]  The patient is here for annual preventive  examination and management of other chronic and acute problems.   The risk factors are reflected in the social history.  The roster of all physicians providing medical care to patient - is listed in the Snapshot section of the chart.  Activities of daily living:  The patient is 100% independent in all ADLs: dressing, toileting, feeding as well as independent mobility  Home safety : The patient has smoke detectors in the home. They wear seatbelts.  There are no firearms at home. There is no violence in the home.   There is no risks for hepatitis, STDs or HIV. There is no   history of blood transfusion. They have no travel history to infectious disease endemic areas of the world.  The patient has seen their dentist in the last six month. They have seen their eye doctor in the last year. They admit to slight hearing difficulty with regard to whispered voices and some television programs.  They have deferred audiologic testing in the last year.  They do not  have excessive sun exposure. Discussed the need for sun protection: hats, long sleeves and use of sunscreen if there is significant sun exposure.   Diet: the importance of a healthy diet is discussed. They do have a healthy diet.  The benefits of regular aerobic exercise were discussed. See below complaint    Depression screen: there are no signs or vegative symptoms of depression- irritability, change in appetite, anhedonia, sadness/tearfullness.  Cognitive assessment: the patient manages all their financial and personal affairs and is actively engaged. They could relate day,date,year and events; recalled 2/3 objects at 3 minutes; performed clock-face test normally.  The following portions of the patient's history were reviewed and updated as appropriate: allergies, current medications, past family history, past  medical history,  past surgical history, past social history  and problem list.  Visual acuity was not assessed per patient preference since she has regular follow up with her ophthalmologist. Hearing and body mass index were assessed and reviewed.   During the course of the visit the patient was educated and counseled about appropriate screening and preventive services including : fall prevention , diabetes screening, nutrition counseling, colorectal cancer screening, and recommended immunizations.    CC: The primary encounter diagnosis was Breast cancer screening. Diagnoses of Osteoporosis, postmenopausal, Vitamin D deficiency, Hyperlipidemia with target LDL less than 100, Fatigue, unspecified type, Spinal stenosis of lumbar region with radiculopathy, Encounter for preventive health examination, Chronic lumbar radiculopathy, and Nocturia more than twice per night were also pertinent to this visit.  C/p  Dizziness today,  Feeling a liittle spacey,  Reports that Cars started honking at her during recent traffic stop.  Fasting today, orthostatics negative for drop in systolic readings. And actually increases by 10 pt  With standing  Position.    Spinal stenosis:  She reports that her back pain has  improved "since I lost a little weight "  .Body mass index is 26.04 kg/m.Marland Kitchen Using tylenol and aleve .  Wants to reach 105 lbs.  Has been taking OsteobiFlex for ten years. Does admit that recurrent back pain preventing her from participating in regular exercise bc it is aggravated by walking .   feels better when using a grocery cart.  Back does not hurt at rest an dimproves with stretching exerciese done every morning .  Left SI  joint is the most problematic , discussed getting a rolling walker    Not sleeping well , because she  takes too long of a nap during the day . Has nocturia 3-4 times per night   [name] has a past medical history of Colon polyps, GERD (gastroesophageal reflux disease),  Hemorrhoids, History of blood transfusion, and Osteoporosis.   She has a past surgical history that includes Fracture surgery; Cholecystectomy (2008); ORIF tibia & fibula fractures (March 2006); ORIF ankle fracture bimalleolar (March 2006); Colonoscopy (2009); Eye surgery (Right); Cesarean section (1610,9604); and Colonoscopy with propofol (N/A, 12/10/2015).   Her family history includes Breast cancer in her cousin; Hypertension in her father, mother, and sister.She reports that she has never smoked. She has never used smokeless tobacco. She reports that she drinks about 0.6 oz of alcohol per week. She reports that she does not use drugs.  Outpatient Medications Prior to Visit  Medication Sig Dispense Refill  . ammonium lactate (AMLACTIN) 12 % lotion Apply 1 application topically as needed for dry skin. 400 g 0  . aspirin 81 MG tablet Take 81 mg by mouth daily.    . Biotin 5000 MCG CAPS Take 1 capsule by mouth daily.    . Calcium Citrate 250 MG TABS Take 2 tablets by mouth 3 (three) times daily.    . Cholecalciferol (VITAMIN D3) 1000 UNITS CAPS Take 2,000 Units by mouth daily.     . Coenzyme Q10 100 MG TABS Take 1 tablet by mouth daily.    Marland Kitchen denosumab (PROLIA) 60 MG/ML SOLN injection Inject 60 mg into the skin every 6 (six) months. Administer in upper arm, thigh, or abdomen    . GARLIC PO Take 1 capsule by mouth daily.    . Lactobacillus-Inulin (CULTURELLE DIGESTIVE HEALTH) CAPS Take 1 capsule by mouth daily. 14 capsule 0  . Magnesium 250 MG TABS Take 1 tablet by mouth daily.    . Melatonin 5 MG TABS Take 1 tablet by mouth as needed.    . Multiple Vitamins-Minerals (CENTRUM SILVER PO) Take 1 tablet by mouth daily.    . Tdap (BOOSTRIX) 5-2.5-18.5 LF-MCG/0.5 injection Inject 0.5 mLs into the muscle once. 0.5 mL 0  . VALERIAN ROOT PO Take 1 capsule by mouth as needed.    . vitamin B-12 (CYANOCOBALAMIN) 1000 MCG tablet Take 5,000 mcg by mouth daily.     . vitamin C (ASCORBIC ACID) 500 MG tablet  Take 500 mg by mouth daily.    Marland Kitchen escitalopram (LEXAPRO) 5 MG tablet Take 1 tablet (5 mg total) by mouth daily. (Patient not taking: Reported on 07/14/2017) 90 tablet 0   No facility-administered medications prior to visit.     Review of Systems   Patient denies headache, fevers, malaise, unintentional weight loss, skin rash, eye pain, sinus congestion and sinus pain, sore throat, dysphagia,  hemoptysis , cough, dyspnea, wheezing, chest pain, palpitations, orthopnea, edema, abdominal pain, nausea, melena, diarrhea, constipation, flank pain, dysuria, hematuria, urinary  Frequency,  numbness, tingling, seizures,  Focal weakness, Loss of consciousness,  Tremor,  and suicidal ideation.      Objective:  BP 120/76 (BP Location: Left Arm, Patient Position: Sitting, Cuff Size: Normal)   Pulse 75   Temp 97.6 F (36.4 C) (Oral)   Resp 15   Ht 4' 6.2" (1.377 m)   Wt 108 lb 12.8 oz (49.4 kg)   SpO2 97%   BMI 26.04 kg/m   Physical Exam   General appearance: alert, cooperative and appears  stated age Ears: normal TM's and external ear canals both ears Throat: lips, mucosa, and tongue normal; teeth and gums normal Neck: no adenopathy, no carotid bruit, supple, symmetrical, trachea midline and thyroid not enlarged, symmetric, no tenderness/mass/nodules Back: symmetric, no curvature. ROM normal. No CVA tenderness. Lungs: clear to auscultation bilaterally Heart: regular rate and rhythm, S1, S2 normal, no murmur, click, rub or gallop Abdomen: soft, non-tender; bowel sounds normal; no masses,  no organomegaly Pulses: 2+ and symmetric Skin: Skin color, texture, turgor normal. No rashes or lesions Lymph nodes: Cervical, supraclavicular, and axillary nodes normal. Neuro: CNs 2-12 intact. DTRs 2+/4 in biceps, brachioradialis, patellars and achilles. Muscle strength 5/5 in upper and lower exremities. Fine resting tremor bilaterally both hands cerebellar function normal. Romberg negative.  No pronator drift.    Gait normal.    Assessment & Plan:   Problem List Items Addressed This Visit    Osteoporosis, postmenopausal    Prior pelvic fractures,  Prior alendronate therapy x 5 years,  still taking  And tolerating Prolia injections despite various complaints of jaw pain,  Pelvic pain and weakness.  All somatic symptoms have improved without intervention       Nocturia more than twice per night      Reviewed diet and fluid intake.  Advised patient to stop drinking caffeinated beverages after noon  And limit evening post dinner liquid intake to just enough to take medications. If this fails to improve symptoms she has been offered trial of  detrol or vesicare but declines .       Encounter for preventive health examination    Annual comprehensive preventive exam was done as well as an evaluation and management of chronic conditions .  During the course of the visit the patient was educated and counseled about appropriate screening and preventive services including :  diabetes screening, lipid analysis with projected  10 year  risk for CAD , nutrition counseling, breast, cervical and colorectal cancer screening, and recommended immunizations.  Printed recommendations for health maintenance screenings was given      Chronic lumbar radiculopathy    Managed with tylenol and Aleve. Aggravated by walking, but asymptomatic if she uses a Passenger transport manager.  Rolling walker offered.        Other Visit Diagnoses    Breast cancer screening    -  Primary   Relevant Orders   MM 3D SCREEN BREAST BILATERAL   Vitamin D deficiency       Relevant Orders   VITAMIN D 25 Hydroxy (Vit-D Deficiency, Fractures) (Completed)   Hyperlipidemia with target LDL less than 100       Relevant Orders   Lipid panel (Completed)   Fatigue, unspecified type       Relevant Orders   Comprehensive metabolic panel (Completed)   CBC with Differential/Platelet (Completed)   TSH (Completed)   Spinal stenosis of lumbar region with  radiculopathy       Relevant Orders   For home use only DME 4 wheeled rolling walker with seat ([id number])      I have discontinued Kenetha C. Detamore's escitalopram. I am also having her maintain her Multiple Vitamins-Minerals (CENTRUM SILVER PO), GARLIC PO, Vitamin D3, vitamin C, vitamin B-12, aspirin, VALERIAN ROOT PO, Melatonin, CULTURELLE DIGESTIVE HEALTH, Magnesium, Biotin, Coenzyme Q10, Tdap, denosumab, ammonium lactate, and Calcium Citrate.  No orders of the defined types were placed in this encounter.   Medications Discontinued During This Encounter  Medication Reason  . escitalopram (LEXAPRO) 5 MG tablet  Patient has not taken in last 30 days    Follow-up: No follow-ups on file.   Sherlene Shams, MD

## 2017-07-17 DIAGNOSIS — R351 Nocturia: Secondary | ICD-10-CM | POA: Insufficient documentation

## 2017-07-17 NOTE — Assessment & Plan Note (Signed)
Annual comprehensive preventive exam was done as well as an evaluation and management of chronic conditions .  During the course of the visit the patient was educated and counseled about appropriate screening and preventive services including :  diabetes screening, lipid analysis with projected  10 year  risk for CAD , nutrition counseling, breast, cervical and colorectal cancer screening, and recommended immunizations.  Printed recommendations for health maintenance screenings was given 

## 2017-07-17 NOTE — Assessment & Plan Note (Signed)
Reviewed diet and fluid intake.  Advised patient to stop drinking caffeinated beverages after noon  And limit evening post dinner liquid intake to just enough to take medications. If this fails to improve symptoms she has been offered trial of  detrol or vesicare but declines .

## 2017-07-17 NOTE — Assessment & Plan Note (Signed)
Managed with tylenol and Aleve. Aggravated by walking, but asymptomatic if she uses a Passenger transport managergrocer cart.  Rolling walker offered.

## 2017-07-28 ENCOUNTER — Other Ambulatory Visit: Payer: Self-pay | Admitting: Internal Medicine

## 2017-07-28 DIAGNOSIS — M810 Age-related osteoporosis without current pathological fracture: Secondary | ICD-10-CM

## 2017-09-15 ENCOUNTER — Other Ambulatory Visit: Payer: Self-pay | Admitting: Internal Medicine

## 2017-09-15 NOTE — Telephone Encounter (Signed)
Req. Refill of Ammonium Lactate 12 % lotion; LRF 11/27/15; 400 g.; no refills  Last OV 07/14/17  PCP Dr. Darrick Huntsmanullo  [facility] [address]   Nurse Triage did not refill, since Rx has expired.

## 2017-09-15 NOTE — Telephone Encounter (Signed)
Copied from CRM [id number]. Topic: Quick Communication - Rx Refill/Question >> Sep 15, 2017 11:49 AM Tamela OddiHarris, Adreana Coull J wrote: Medication: ammonium lactate (AMLACTIN) 12 % lotion  Patient called to request refill for the above medication.  CB# [phone]  Preferred Pharmacy (with phone number or street name): [facility] [address] - [place], [place] - [id number] [phone] (Phone) [phone] (Fax)

## 2017-09-20 NOTE — Telephone Encounter (Signed)
Is this ok to refill patient last seen on 07-14-17 but this was last filled on 11-27-15

## 2017-09-22 MED ORDER — AMMONIUM LACTATE 12 % EX LOTN: 1.0000 "application " | TOPICAL_LOTION | CUTANEOUS | 0 refills | Status: AC | PRN

## 2017-09-22 NOTE — Telephone Encounter (Signed)
Refilled, it is harmless

## 2017-09-27 ENCOUNTER — Telehealth: Payer: Self-pay

## 2017-09-27 DIAGNOSIS — M5416 Radiculopathy, lumbar region: Secondary | ICD-10-CM

## 2017-09-27 NOTE — Telephone Encounter (Signed)
Copied from CRM [id number]. Topic: Appointment Scheduling - Scheduling Inquiry for Clinic >> Sep 27, 2017  1:40 PM Natalie Coleman, Norma J wrote: reason for CRM: pt is calling and would like to know when will she be due for her next prolia injection

## 2017-09-28 NOTE — Telephone Encounter (Signed)
Order has been signed and pt has been notified that the order is up front ready to be picked up.

## 2017-09-28 NOTE — Telephone Encounter (Signed)
dme printed

## 2017-09-28 NOTE — Addendum Note (Signed)
Addended by: Sherlene ShamsULLO, Abeeha Twist L on: 09/28/2017 11:57 AM   Modules accepted: Orders

## 2017-09-28 NOTE — Telephone Encounter (Signed)
Spoke with pt to let her know that her next prolia injection is due in September. Also let the pt know that Olegario MessierKathy would give her a call to schedule the appt for the injection as soon as she got it approved through her insurance. While on the phone with the pt she stated that she would like to get a DMe order for a rollator(4 wheeled walker w/ seat). Pt stated that she has a walker but it does not have the seat or wheels and she needs one for when she goes out in town. Is it okay to print a DME order for this?

## 2017-10-19 ENCOUNTER — Telehealth: Payer: Self-pay

## 2017-10-19 DIAGNOSIS — M5416 Radiculopathy, lumbar region: Secondary | ICD-10-CM

## 2017-10-19 NOTE — Telephone Encounter (Signed)
Corrected, printed, signed and given to pt.

## 2017-10-24 ENCOUNTER — Telehealth: Payer: Self-pay | Admitting: Internal Medicine

## 2017-10-24 NOTE — Telephone Encounter (Signed)
Copied from CRM [id number]. Topic: Quick Communication - See Telephone Encounter >> Oct 24, 2017  3:26 PM Jens Som A wrote: CRM for notification. See Telephone encounter for: 10/24/17. Patient called to inquire if her projelia injection is available. Please advise [phone]

## 2017-10-24 NOTE — Telephone Encounter (Signed)
Patient scheduled for 10/25/17, at 10/30/17

## 2017-10-24 NOTE — Telephone Encounter (Signed)
Verifiecation sent for Prolia on9/6/19 awaiting insurance,

## 2017-10-24 NOTE — Telephone Encounter (Signed)
Prolia injection

## 2017-10-25 ENCOUNTER — Ambulatory Visit (INDEPENDENT_AMBULATORY_CARE_PROVIDER_SITE_OTHER): Payer: Medicare Other | Admitting: *Deleted

## 2017-10-25 DIAGNOSIS — M810 Age-related osteoporosis without current pathological fracture: Secondary | ICD-10-CM | POA: Diagnosis not present

## 2017-10-25 MED ORDER — DENOSUMAB 60 MG/ML ~~LOC~~ SOSY
60.0000 mg | PREFILLED_SYRINGE | Freq: Once | SUBCUTANEOUS | Status: AC
  Administered 2017-10-25: 60 mg via SUBCUTANEOUS

## 2017-10-25 NOTE — Progress Notes (Addendum)
Patient presented for Prolia injection to Right arm [place], patient voiced no concerns or complaints during or after injection.  Reviewed.  Dr Scott 

## 2018-03-01 ENCOUNTER — Ambulatory Visit
Admission: RE | Admit: 2018-03-01 | Discharge: 2018-03-01 | Disposition: A | Discharge status: Home / Self Care | Payer: Medicare Other | Source: Ambulatory Visit | Attending: Internal Medicine | Admitting: Internal Medicine

## 2018-03-01 DIAGNOSIS — Z1239 Encounter for other screening for malignant neoplasm of breast: Secondary | ICD-10-CM

## 2018-03-01 DIAGNOSIS — M810 Age-related osteoporosis without current pathological fracture: Secondary | ICD-10-CM

## 2018-03-06 ENCOUNTER — Telehealth: Payer: Self-pay | Admitting: *Deleted

## 2018-03-06 NOTE — Telephone Encounter (Signed)
FYI

## 2018-03-06 NOTE — Telephone Encounter (Signed)
Patient states she received a call from the office- probably a appointment reminder call- can not find any other call.Patient is in a lot of pain- she has appointment on Friday. Patient has a name of a hip doctor she wants to see- she will let Dr Darrick Huntsman know about it when she sees her. Patient did not take her pain medication because she does not want to get addicted to pain medicine.

## 2018-03-10 ENCOUNTER — Ambulatory Visit: Payer: Medicare Other | Admitting: Internal Medicine

## 2018-03-10 ENCOUNTER — Encounter: Payer: Self-pay | Admitting: Internal Medicine

## 2018-03-10 ENCOUNTER — Encounter

## 2018-03-10 VITALS — BP 124/86 | HR 78 | Temp 98.3°F | Resp 15 | Ht <= 58 in | Wt 107.0 lb

## 2018-03-10 DIAGNOSIS — M48061 Spinal stenosis, lumbar region without neurogenic claudication: Secondary | ICD-10-CM

## 2018-03-10 DIAGNOSIS — M25552 Pain in left hip: Secondary | ICD-10-CM

## 2018-03-10 DIAGNOSIS — M810 Age-related osteoporosis without current pathological fracture: Secondary | ICD-10-CM

## 2018-03-10 DIAGNOSIS — M5416 Radiculopathy, lumbar region: Secondary | ICD-10-CM

## 2018-03-10 DIAGNOSIS — F411 Generalized anxiety disorder: Secondary | ICD-10-CM

## 2018-03-10 DIAGNOSIS — E785 Hyperlipidemia, unspecified: Secondary | ICD-10-CM | POA: Diagnosis not present

## 2018-03-10 DIAGNOSIS — G8929 Other chronic pain: Secondary | ICD-10-CM

## 2018-03-10 LAB — COMPREHENSIVE METABOLIC PANEL
AG Ratio: 2 (calc) (ref 1.0–2.5)
ALBUMIN MSPROF: 4.4 g/dL (ref 3.6–5.1)
ALT: 37 U/L — ABNORMAL HIGH (ref 6–29)
AST: 34 U/L (ref 10–35)
Alkaline phosphatase (APISO): 54 U/L (ref 33–130)
BUN / CREAT RATIO: 37 (calc) — AB (ref 6–22)
BUN: 26 mg/dL — AB (ref 7–25)
CO2: 27 mmol/L (ref 20–32)
CREATININE: 0.7 mg/dL (ref 0.60–0.93)
Calcium: 10.3 mg/dL (ref 8.6–10.4)
Chloride: 104 mmol/L (ref 98–110)
GLOBULIN: 2.2 g/dL (ref 1.9–3.7)
Glucose, Bld: 90 mg/dL (ref 65–99)
POTASSIUM: 4.5 mmol/L (ref 3.5–5.3)
Sodium: 140 mmol/L (ref 135–146)
TOTAL PROTEIN: 6.6 g/dL (ref 6.1–8.1)
Total Bilirubin: 0.3 mg/dL (ref 0.2–1.2)

## 2018-03-10 NOTE — Assessment & Plan Note (Signed)
Secondary o mild DJD of hip and SEVERE lumbar spinal stenosis with S1 nerve root contact with bulging disk

## 2018-03-10 NOTE — Patient Instructions (Signed)
Your last physical was June 2019  I will make the referral to Dr Lovell Sheehan for your back   You need to have your kidney and liver test today (blood test)   Spinal Stenosis  Spinal stenosis occurs when the open space (spinal canal) between the bones of your spine (vertebrae) narrows, putting pressure on the spinal cord or nerves. What are the causes? This condition is caused by areas of bone pushing into the central canals of your vertebrae. This condition may be present at birth (congenital), or it may be caused by:  Arthritic deterioration of your vertebrae (spinal degeneration). This usually starts around age 40.  Injury or trauma to the spine.  Tumors in the spine.  Calcium deposits in the spine. What are the signs or symptoms? Symptoms of this condition include:  Pain in the neck or back that is generally worse with activities, particularly when standing and walking.  Numbness, tingling, hot or cold sensations, weakness, or weariness in your legs.  Pain going up and down the leg (sciatica).  Frequent episodes of falling.  A foot-slapping gait that leads to muscle weakness. In more serious cases, you may develop:  Problemspassing stool or passing urine.  Difficulty having sex.  Loss of feeling in part or all of your leg. Symptoms may come on slowly and get worse over time. How is this diagnosed? This condition is diagnosed based on your medical history and a physical exam. Tests will also be done, such as:  MRI.  CT scan.  X-ray. How is this treated? Treatment for this condition often focuses on managing your pain and any other symptoms. Treatment may include:  Practicing good posture to lessen pressure on your nerves.  Exercising to strengthen muscles, build endurance, improve balance, and maintain good joint movement (range of motion).  Losing weight, if needed.  Taking medicines to reduce swelling, inflammation, or pain.  Assistive devices, such as a  corset or brace. In some cases, surgery may be needed. The most common procedure is decompression laminectomy. This is done to remove excess bone that puts pressure on your nerve roots. Follow these instructions at home: Managing pain, stiffness, and swelling  Do all exercises and stretches as told by your health care provider.  Practice good posture. If you were given a brace or a corset, wear it as told by your health care provider.  Do not do any activities that cause pain. Ask your health care provider what activities are safe for you.  Do not lift anything that is heavier than 10 lb (4.5 kg) or the limit that your health care provider tells you.  Maintain a healthy weight. Talk with your health care provider if you need help losing weight.  If directed, apply heat to the affected area as often as told by your health care provider. Use the heat source that your health care provider recommends, such as a moist heat pack or a heating pad. ? Place a towel between your skin and the heat source. ? Leave the heat on for 20-30 minutes. ? Remove the heat if your skin turns bright red. This is especially important if you are not able to feel pain, heat, or cold. You may have a greater risk of getting burned. General instructions  Take over-the-counter and prescription medicines only as told by your health care provider.  Do not use any products that contain nicotine or tobacco, such as cigarettes and e-cigarettes. If you need help quitting, ask your health care provider.  Eat a healthy diet. This includes plenty of fruits and vegetables, whole grains, and low-fat (lean) protein.  Keep all follow-up visits as told by your health care provider. This is important. Contact a health care provider if:  Your symptoms do not get better or they get worse.  You have a fever. Get help right away if:  You have new or worse pain in your neck or upper back.  You have severe pain that cannot be  controlled with medicines.  You are dizzy.  You have vision problems, blurred vision, or double vision.  You have a severe headache that is worse when you stand.  You have nausea or you vomit.  You develop new or worse numbness or tingling in your back or legs.  You have pain, redness, swelling, or warmth in your arm or leg. Summary  Spinal stenosis occurs when the open space (spinal canal) between the bones of your spine (vertebrae) narrows. This narrowing puts pressure on the spinal cord or nerves.  Spinal stenosis can cause numbness, weakness, or pain in the neck, back, and legs.  This condition may be caused by a birth defect, arthritic deterioration of your vertebrae, injury, tumors, or calcium deposits.  This condition is usually diagnosed with MRIs, CT scans, and X-rays. This information is not intended to replace advice given to you by your health care provider. Make sure you discuss any questions you have with your health care provider. Document Released: 04/24/2003 Document Revised: 01/07/2016 Document Reviewed: 01/07/2016 Elsevier Interactive Patient Education  2019 [employer].
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
## 2018-03-12 NOTE — Assessment & Plan Note (Signed)
Aggravated by loneliness,  Which has improved since her daughter relocated from [place] to [place].  She prefers to avoid prescribed medications

## 2018-03-12 NOTE — Assessment & Plan Note (Signed)
Now managed with Prolia after 5 years of bisphosponate therapy,  No history of fragility fractures.  Next injection due in March

## 2018-03-12 NOTE — Assessment & Plan Note (Signed)
Mild, with 10 year risk 10%.  No therapy advised   Lab Results  Component Value Date   CHOL 228 (H) 07/14/2017   HDL 67.50 07/14/2017   LDLCALC 139 (H) 07/14/2017   LDLDIRECT 115.0 01/16/2016   TRIG 108.0 07/14/2017   CHOLHDL 3 07/14/2017

## 2018-03-12 NOTE — Assessment & Plan Note (Signed)
With S1 nerve root impingement on the left suggested by MRI and exam.  Referral to Delma Officer as requested,  Continue to manage daytime pain with tylenol and Aleve. Rolling walker prescribed

## 2018-03-14 ENCOUNTER — Other Ambulatory Visit: Payer: Self-pay | Admitting: Internal Medicine

## 2018-03-14 DIAGNOSIS — R740 Nonspecific elevation of levels of transaminase and lactic acid dehydrogenase [LDH]: Principal | ICD-10-CM

## 2018-03-14 DIAGNOSIS — R7401 Elevation of levels of liver transaminase levels: Secondary | ICD-10-CM

## 2018-03-14 NOTE — Progress Notes (Signed)
hepatid

## 2018-03-15 ENCOUNTER — Telehealth: Payer: Self-pay | Admitting: *Deleted

## 2018-03-15 NOTE — Telephone Encounter (Signed)
See result note message 

## 2018-03-15 NOTE — Telephone Encounter (Signed)
Copied from CRM [id number]. Topic: General - Other >> Mar 15, 2018 11:03 AM Trula Slade wrote: Reason for CRM:   Patient would like a call back from the medical assistant or the doctor.  She stated she talked to someone yesterday, 03/14/18 and she needed some things clarified.

## 2018-03-21 ENCOUNTER — Telehealth: Payer: Self-pay | Admitting: *Deleted

## 2018-03-21 NOTE — Telephone Encounter (Signed)
Copied from CRM [id number]. Topic: General - Other >> Mar 21, 2018  3:35 PM Elliot Gault wrote: Relation to pt: self  Call back number: [phone]   Reason for call:  As per Lovell Sheehan, Natalie Coleman neurosurgeon awaiting office notes, imaging results, lab results supporting why referral was placed. Patient would like a follow up call when notes are faxed, please advise

## 2018-03-22 NOTE — Telephone Encounter (Signed)
Spoke with Melissa and she looked at the referral that was sent the other day and all of the information they requested has been sent. Called pt to let her know. Pt gave a verbal understanding.

## 2018-03-29 ENCOUNTER — Other Ambulatory Visit (INDEPENDENT_AMBULATORY_CARE_PROVIDER_SITE_OTHER): Payer: Medicare Other

## 2018-03-29 DIAGNOSIS — R7401 Elevation of levels of liver transaminase levels: Secondary | ICD-10-CM

## 2018-03-29 DIAGNOSIS — R740 Nonspecific elevation of levels of transaminase and lactic acid dehydrogenase [LDH]: Secondary | ICD-10-CM | POA: Diagnosis not present

## 2018-03-29 LAB — HEPATIC FUNCTION PANEL
ALT: 15 U/L (ref 0–35)
AST: 20 U/L (ref 0–37)
Albumin: 4.1 g/dL (ref 3.5–5.2)
Alkaline Phosphatase: 49 U/L (ref 39–117)
BILIRUBIN TOTAL: 0.4 mg/dL (ref 0.2–1.2)
Bilirubin, Direct: 0.1 mg/dL (ref 0.0–0.3)
Total Protein: 6.9 g/dL (ref 6.0–8.3)

## 2018-04-13 ENCOUNTER — Telehealth: Payer: Self-pay

## 2018-04-13 NOTE — Telephone Encounter (Signed)
Copied from CRM [id number]. Topic: General - Other >> Apr 13, 2018  1:48 PM Mcneil, Ja-Kwan wrote: Reason for CRM: Pt called to check the status of her request to get a walker with a seat. Pt stated the supply office contacted her two days ago but she has not heard back from them. Pt stated the paperwork was left in the office and she would like to know the status.

## 2018-04-13 NOTE — Telephone Encounter (Signed)
Placed in red folder  

## 2018-04-14 DIAGNOSIS — Z0279 Encounter for issue of other medical certificate: Secondary | ICD-10-CM

## 2018-04-14 NOTE — Telephone Encounter (Signed)
Form has been faxed.

## 2018-04-14 NOTE — Telephone Encounter (Signed)
Form completed and returned in red folder

## 2018-05-01 ENCOUNTER — Telehealth: Payer: Self-pay

## 2018-05-01 NOTE — Telephone Encounter (Signed)
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 tried to notify patient no answer.left message to call office.

## 2018-05-01 NOTE — Telephone Encounter (Signed)
Copied from CRM [id number]. Topic: General - Other >> May 01, 2018 10:58 AM Marylen Ponto wrote: Reason for CRM: Pt stated it is time for her to have the prolia injection. Pt requests call back. Cb# [phone]

## 2018-05-02 NOTE — Telephone Encounter (Signed)
prolia scheduled.

## 2018-05-03 ENCOUNTER — Other Ambulatory Visit: Payer: Self-pay

## 2018-05-03 ENCOUNTER — Ambulatory Visit (INDEPENDENT_AMBULATORY_CARE_PROVIDER_SITE_OTHER): Payer: Medicare Other

## 2018-05-03 DIAGNOSIS — M810 Age-related osteoporosis without current pathological fracture: Secondary | ICD-10-CM

## 2018-05-03 MED ORDER — DENOSUMAB 60 MG/ML ~~LOC~~ SOSY
60.0000 mg | PREFILLED_SYRINGE | Freq: Once | SUBCUTANEOUS | Status: AC
  Administered 2018-05-03: 60 mg via SUBCUTANEOUS

## 2018-05-03 NOTE — Progress Notes (Signed)
Natalie Coleman presents today for injection per MD orders. Prolia injeciton administered SQ in left Upper Arm. Administration without incident. Patient tolerated well.  Coralee North, cma

## 2018-05-08 ENCOUNTER — Ambulatory Visit: Payer: Medicare Other

## 2018-05-18 ENCOUNTER — Ambulatory Visit: Payer: Medicare Other

## 2018-06-05 ENCOUNTER — Other Ambulatory Visit: Payer: Self-pay

## 2018-06-05 ENCOUNTER — Ambulatory Visit (INDEPENDENT_AMBULATORY_CARE_PROVIDER_SITE_OTHER): Payer: Medicare Other

## 2018-06-05 DIAGNOSIS — Z0000 Encounter for general adult medical examination without abnormal findings: Secondary | ICD-10-CM | POA: Diagnosis not present

## 2018-06-05 NOTE — Patient Instructions (Addendum)
  Natalie Coleman , Thank you for taking time to come for your Medicare Wellness Visit. I appreciate your ongoing commitment to your health goals. Please review the following plan we discussed and let me know if I can assist you in the future.   Call [facility] regarding to finish processing your walker. [phone].  These are the goals we discussed: Goals      Patient Stated   .  Complete Advanced Directive  (pt-stated)     Living Will HCPOA    . Increase physical activity (pt-stated)     Stretch and walk for exercise more as tolerated       This is a list of the screening recommended for you and due dates:  Health Maintenance  Topic Date Due  . Tetanus Vaccine  08/16/2014  . Flu Shot  09/16/2018  . DEXA scan (bone density measurement)  Completed  . Pneumonia vaccines  Completed

## 2018-06-05 NOTE — Progress Notes (Addendum)
Subjective:   Natalie Coleman is a 78 y.o. female who presents for Medicare Annual (Subsequent) preventive examination.  Review of Systems:  No ROS.  Medicare Wellness Visit. Additional risk factors are reflected in the social history.  Cardiac Risk Factors include: advanced age (>2355men, 53>65 women)    Objective:     Vitals: There were no vitals taken for this visit.  There is no height or weight on file to calculate BMI.   UTA vital signs, virtual visit. Advanced Directives 06/05/2018 05/03/2017 02/02/2016 12/30/2014  Does Patient Have a Medical Advance Directive? No No No No  Would patient like information on creating a medical advance directive? No - Patient declined No - Patient declined No - Patient declined Yes - Transport plannerducational materials given    Tobacco Social History   Tobacco Use  Smoking Status Never Smoker  Smokeless Tobacco Never Used     Counseling given: Not Answered   Clinical Intake:  Pre-visit preparation completed: Yes        Diabetes: No  How often do you need to have someone help you when you read instructions, pamphlets, or other written materials from your doctor or pharmacy?: 1 - Never  Interpreter Needed?: No     Past Medical History:  Diagnosis Date  . Colon polyps   . GERD (gastroesophageal reflux disease)   . Hemorrhoids   . History of blood transfusion   . Osteoporosis    Past Surgical History:  Procedure Laterality Date  . CESAREAN SECTION  [id number]  . CHOLECYSTECTOMY  2008  . COLONOSCOPY  2009  . COLONOSCOPY WITH PROPOFOL N/A 12/10/2015   Procedure: COLONOSCOPY WITH PROPOFOL;  Surgeon: Earline MayotteJeffrey W Byrnett, MD;  [facility];  Service: Endoscopy;  Laterality: N/A;  . EYE SURGERY Right   . FRACTURE SURGERY     right tibia  . ORIF ANKLE FRACTURE BIMALLEOLAR  March 2006  . ORIF TIBIA & FIBULA FRACTURES  March 2006   Family History  Problem Relation Age of Onset  . Hypertension Father   . Hypertension Mother    . Hypertension Sister   . Breast cancer Cousin   . Cancer Neg Hx    Social History   Socioeconomic History  . Marital status: Divorced    Spouse name: Not on file  . Number of children: Not on file  . Years of education: Not on file  . Highest education level: Not on file  Occupational History  . Not on file  Social Needs  . Financial resource strain: Not hard at all  . Food insecurity:    Worry: Never true    Inability: Never true  . Transportation needs:    Medical: No    Non-medical: No  Tobacco Use  . Smoking status: Never Smoker  . Smokeless tobacco: Never Used  Substance and Sexual Activity  . Alcohol use: Yes    Alcohol/week: 1.0 standard drinks    Types: 1 Glasses of wine per week    Comment: ocassional  . Drug use: No  . Sexual activity: Never  Lifestyle  . Physical activity:    Days per week: 0 days    Minutes per session: Not on file  . Stress: Not on file  Relationships  . Social connections:    Talks on phone: Not on file    Gets together: Not on file    Attends religious service: Not on file    Active member of club or organization: Not on file  Attends meetings of clubs or organizations: Not on file    Relationship status: Not on file  Other Topics Concern  . Not on file  Social History Narrative  . Not on file    Outpatient Encounter Medications as of 06/05/2018  Medication Sig  . ammonium lactate (AMLACTIN) 12 % lotion Apply 1 application topically as needed for dry skin.  Marland Kitchen aspirin 81 MG tablet Take 81 mg by mouth daily.  . Biotin 5000 MCG CAPS Take 1 capsule by mouth daily.  . Calcium Citrate 250 MG TABS Take 2 tablets by mouth 3 (three) times daily.  . Cholecalciferol (VITAMIN D3) 1000 UNITS CAPS Take 2,000 Units by mouth daily.   . Coenzyme Q10 100 MG TABS Take 1 tablet by mouth daily.  Marland Kitchen denosumab (PROLIA) 60 MG/ML SOLN injection Inject 60 mg into the skin every 6 (six) months. Administer in upper arm, thigh, or abdomen  . GARLIC PO  Take 1 capsule by mouth daily.  . Lactobacillus-Inulin (CULTURELLE DIGESTIVE HEALTH) CAPS Take 1 capsule by mouth daily.  . Magnesium 250 MG TABS Take 1 tablet by mouth daily.  . Melatonin 5 MG TABS Take 1 tablet by mouth as needed.  . Multiple Vitamins-Minerals (CENTRUM SILVER PO) Take 1 tablet by mouth daily.  Marland Kitchen VALERIAN ROOT PO Take 1 capsule by mouth as needed.  . vitamin B-12 (CYANOCOBALAMIN) 1000 MCG tablet Take 5,000 mcg by mouth daily.   . vitamin C (ASCORBIC ACID) 500 MG tablet Take 500 mg by mouth daily.  . Tdap (BOOSTRIX) 5-2.5-18.5 LF-MCG/0.5 injection Inject 0.5 mLs into the muscle once.   No facility-administered encounter medications on file as of 06/05/2018.     Activities of Daily Living In your present state of health, do you have any difficulty performing the following activities: 06/05/2018  Hearing? N  Vision? N  Difficulty concentrating or making decisions? N  Walking or climbing stairs? Y  Comment Unsteady gait  Dressing or bathing? N  Doing errands, shopping? N  Preparing Food and eating ? N  Using the Toilet? N  In the past six months, have you accidently leaked urine? Y  Comment Managed with daily liner   Do you have problems with loss of bowel control? N  Managing your Medications? N  Managing your Finances? N  Housekeeping or managing your Housekeeping? N  Some recent data might be hidden    Patient Care Team: Sherlene Shams, MD as PCP - General (Internal Medicine) Lemar Livings, Merrily Pew, MD (General Surgery) Sherlene Shams, MD (Internal Medicine)    Assessment:   This is a routine wellness examination for Natalie Coleman.  I connected with patient 06/05/18 at  9:00 AM EDT by a video enabled telemedicine application and verified that I am speaking with the correct person using two identifiers. Patient stated full name and DOB. Patient gave permission to continue with virtual visit. Patient's location was at home and Nurse's location was at [place] office.    Health Screenings  Mammogram -03/01/18 Colonoscopy -12/10/15 Bone Density -03/01/18 Glaucoma -none reported Hearing -demonstrated normal hearing during conversation Cholesterol -07/14/17 (228) Dental-every 6 months Vision- every 12 months  Social  Alcohol intake -yes Smoking history- never Smokers in home? none Illicit drug use? none Exercise -walking Diet -regular Sexually Active -never  Safety  Patient feels safe at home.  Patient does have smoke detectors at home  Patient does wear sunscreen or protective clothing when in direct sunlight  Patient does wear seat belt when driving or  riding with others.   Activities of Daily Living Patient can do their own household chores. Denies needing assistance with: driving, feeding themselves, getting from bed to chair, getting to the toilet, bathing/showering, dressing, managing money, or preparing meals.   Depression Screen Patient denies losing interest in daily life, feeling hopeless, or crying easily over simple problems.   Fall Screen Patient denies being afraid of falling or falling in the last year.   Memory Screen Patient denies problems with memory, misplacing items, and is able to balance checkbook/bank accounts.  Patient is alert, normal appearance, oriented to person/place/and time. Correctly identified the president of the [place], recall of 2/3 objects, and performing simple calculations.  Patient displays appropriate judgement and can read correct time from watch face.   Immunizations The following Immunizations were discussed: Influenza, shingles, pneumonia, and tetanus.   Other Providers Patient Care Team: Sherlene Shams, MD as PCP - General (Internal Medicine) Lemar Livings Merrily Pew, MD (General Surgery) Sherlene Shams, MD (Internal Medicine)  Exercise Activities and Dietary recommendations Current Exercise Habits: The patient does not participate in regular exercise at present  Goals      Patient Stated   .   Complete Advanced Directive  (pt-stated)     Living Will HCPOA    . Increase physical activity (pt-stated)     Stretch and walk for exercise more as tolerated       Fall Risk Fall Risk  06/05/2018 05/03/2017 02/02/2016 01/14/2015 01/13/2015  Falls in the past year? 0 No No No No   Depression Screen PHQ 2/9 Scores 06/05/2018 07/17/2017 05/03/2017 02/02/2016  PHQ - 2 Score 0 - 2 0  PHQ- 9 Score - - 9 -  Exception Documentation - (No Data) - -     Cognitive Function MMSE - Mini Mental State Exam 12/30/2014  Orientation to time 5  Orientation to Place 5  Registration 3  Attention/ Calculation 5  Recall 3  Language- name 2 objects 2  Language- repeat 1  Language- follow 3 step command 3  Language- read & follow direction 1  Write a sentence 1  Copy design 1  Total score 30     6CIT Screen 06/05/2018 05/03/2017  What Year? 0 points 0 points  What month? 0 points 0 points  What time? 0 points 0 points  Count back from 20 0 points 0 points  Months in reverse 0 points 0 points  Repeat phrase 0 points 0 points  Total Score 0 0    Immunization History  Administered Date(s) Administered  . Pneumococcal Conjugate-13 08/15/2012  . Pneumococcal Polysaccharide-23 08/17/2004, 01/13/2015  . Td 08/15/2004  . Zoster 01/18/2014   Screening Tests Health Maintenance  Topic Date Due  . TETANUS/TDAP  08/16/2014  . INFLUENZA VACCINE  09/16/2018  . DEXA SCAN  Completed  . PNA vac Low Risk Adult  Completed      Plan:   End of life planning; Advance aging; Advanced directives discussed. Copy of current HCPOA/Living Will requested.    Per request, phone number to [facility] given to patient for follow up regarding her walker. [phone]..   I have personally reviewed and noted the following in the patient's chart:   . Medical and social history . Use of alcohol, tobacco or illicit drugs  . Current medications and supplements . Functional ability and status . Nutritional status .  Physical activity . Advanced directives . List of other physicians . Hospitalizations, surgeries, and ER visits in previous 12 months .  Vitals . Screenings to include cognitive, depression, and falls . Referrals and appointments  In addition, I have reviewed and discussed with patient certain preventive protocols, quality metrics, and best practice recommendations. A written personalized care plan for preventive services as well as general preventive health recommendations were provided to patient.     OBrien-Blaney, Nikiya Starn L, LPN  1/61/0960    I have reviewed the above information and agree with above.   Duncan Dull, MD

## 2018-07-19 ENCOUNTER — Ambulatory Visit: Payer: Medicare Other

## 2018-07-28 ENCOUNTER — Ambulatory Visit: Payer: Self-pay

## 2018-07-28 ENCOUNTER — Telehealth: Payer: Self-pay

## 2018-07-28 ENCOUNTER — Encounter: Payer: Self-pay | Admitting: Internal Medicine

## 2018-07-28 ENCOUNTER — Ambulatory Visit (INDEPENDENT_AMBULATORY_CARE_PROVIDER_SITE_OTHER): Payer: Medicare Other | Admitting: Internal Medicine

## 2018-07-28 ENCOUNTER — Other Ambulatory Visit: Payer: Self-pay

## 2018-07-28 DIAGNOSIS — Z7189 Other specified counseling: Secondary | ICD-10-CM

## 2018-07-28 DIAGNOSIS — R600 Localized edema: Secondary | ICD-10-CM

## 2018-07-28 MED ORDER — FUROSEMIDE 20 MG PO TABS: 20.0000 mg | ORAL_TABLET | ORAL | 0 refills | Status: DC | PRN

## 2018-07-28 NOTE — Progress Notes (Signed)
Pt stated that she is having bilateral leg swelling that starts at the knees and goes to her feet. The pt stated that there is no pain. Pt sated that she noticed the swelling yesterday and it has gone just a little bit since then.

## 2018-07-28 NOTE — Telephone Encounter (Signed)
Pt called stating that yesterday she was up and out and on her feet. She noticed some swelling in both legs.  Today the swelling remains. She states that her sock makes indentations. She states the swellinf is slightly worse to the rt leg . Swelling only extends to the knee. Care advice read to patient. Patient verbalized understanding. Call transferred to office for appointment.  Reason for Disposition . [1] MODERATE leg swelling (e.g., swelling extends up to knees) AND [2] new onset or worsening  Answer Assessment - Initial Assessment Questions 1. ONSET: "When did the swelling start?" (e.g., minutes, hours, days)     yesterday 2. LOCATION: "What part of the leg is swollen?"  "Are both legs swollen or just one leg?"     Both legs more Rt leg 3. SEVERITY: "How bad is the swelling?" (e.g., localized; mild, moderate, severe)  - Localized - small area of swelling localized to one leg  - MILD pedal edema - swelling limited to foot and ankle, pitting edema < 1/4 inch (6 mm) deep, rest and elevation eliminate most or all swelling  - MODERATE edema - swelling of lower leg to knee, pitting edema > 1/4 inch (6 mm) deep, rest and elevation only partially reduce swelling  - SEVERE edema - swelling extends above knee, facial or hand swelling present      Knees down Moderate  4. REDNESS: "Does the swelling look red or infected?"     no 5. PAIN: "Is the swelling painful to touch?" If so, ask: "How painful is it?"   (Scale 1-10; mild, moderate or severe)    no 6. FEVER: "Do you have a fever?" If so, ask: "What is it, how was it measured, and when did it start?"     No 7. CAUSE: "What do you think is causing the leg swelling?"    unsure 8. MEDICAL HISTORY: "Do you have a history of heart failure, kidney disease, liver failure, or cancer?"     no 9. RECURRENT SYMPTOM: "Have you had leg swelling before?" If so, ask: "When was the last time?" "What happened that time?"    no 10. OTHER SYMPTOMS: "Do you  have any other symptoms?" (e.g., chest pain, difficulty breathing)      none 11. PREGNANCY: "Is there any chance you are pregnant?" "When was your last menstrual period?"       N/A  Protocols used: LEG SWELLING AND EDEMA-A-AH

## 2018-07-28 NOTE — Telephone Encounter (Signed)
Pt called stating that yesterday she was up and out and on her feet. She noticed some swelling in both legs.  Today the swelling remains. She states that her sock makes indentations. She states the swellinf is slightly worse to the rt leg . Swelling only extends to the knee. Care advice read to patient. Patient verbalized understanding. Call transferred to office for appointment.  Reason for Disposition . [1] MODERATE leg swelling (e.g., swelling extends up to knees) AND [2] new onset or worsening  Answer Assessment - Initial Assessment Questions 1. ONSET: "When did the swelling start?" (e.g., minutes, hours, days)     yesterday 2. LOCATION: "What part of the leg is swollen?"  "Are both legs swollen or just one leg?"     Both legs more Rt leg 3. SEVERITY: "How bad is the swelling?" (e.g., localized; mild, moderate, severe)  - Localized - small area of swelling localized to one leg  - MILD pedal edema - swelling limited to foot and ankle, pitting edema < 1/4 inch (6 mm) deep, rest and elevation eliminate most or all swelling  - MODERATE edema - swelling of lower leg to knee, pitting edema > 1/4 inch (6 mm) deep, rest and elevation only partially reduce swelling  - SEVERE edema - swelling extends above knee, facial or hand swelling present      Knees down Moderate  4. REDNESS: "Does the swelling look red or infected?"     no 5. PAIN: "Is the swelling painful to touch?" If so, ask: "How painful is it?"   (Scale 1-10; mild, moderate or severe)    no 6. FEVER: "Do you have a fever?" If so, ask: "What is it, how was it measured, and when did it start?"     No 7. CAUSE: "What do you think is causing the leg swelling?"    unsure 8. MEDICAL HISTORY: "Do you have a history of heart failure, kidney disease, liver failure, or cancer?"     no 9. RECURRENT SYMPTOM: "Have you had leg swelling before?" If so, ask: "When was the last time?" "What happened that time?"    no 10. OTHER SYMPTOMS: "Do you  have any other symptoms?" (e.g., chest pain, difficulty breathing)      none 11. PREGNANCY: "Is there any chance you are pregnant?" "When was your last menstrual period?"       N/A  Protocols used: LEG SWELLING AND EDEMA-A-AH 

## 2018-07-28 NOTE — Progress Notes (Signed)
Telephone Note  This visit type was conducted due to national recommendations for restrictions regarding the COVID-19 pandemic (e.g. social distancing).  This format is felt to be most appropriate for this patient at this time.  All issues noted in this document were discussed and addressed.  No physical exam was performed (except for noted visual exam findings with Video Visits).   I connected with@ on 07/28/18 at 11:30 AM EDT by a video enabled telemedicine application or telephone and verified that I am speaking with the correct person using two identifiers. Location patient: home Location provider: work or home office Persons participating in the virtual visit: patient, provider  I discussed the limitations, risks, security and privacy concerns of performing an evaluation and management service by telephone and the availability of in person appointments. I also discussed with the patient that there may be a patient responsible charge related to this service. The patient expressed understanding and agreed to proceed.   Reason for visit: bilateral lower leg edema  HPI:  Patient is sobbing today , because she researched ("I googled")  the internet about the causes of leg edema and is afraid of the potential diagnosis. She reports that her legs have been  Swelling on and off for the past several days .   swelling is painless and she denies shortness of breath,  The swelling is Slightly improved in the morning .  Has been sitting most of the time and is not leaving hte house  TO WALK because of the COVID 19 EPIDEMIC   Spinal stenosis :  Has historically refused opioids and tramadol.  Was using an otc analgesic that helped tremendously  Stopped taking 81 mg aspirin because she is using [employer] back and body  For severe  Pain, tylenol 8 hour  , And aleve twice daily   The patient has no signs or symptoms of COVID 19 infection (fever, cough, sore throat  or shortness of breath beyond what is typical  for patient).  Patient denies contact with other persons with the above mentioned symptoms or with anyone confirmed to have COVID 19 .    ROS: Patient denies headache, fevers, malaise, unintentional weight loss, skin rash, eye pain, sinus congestion and sinus pain, sore throat, dysphagia,  hemoptysis , cough, dyspnea, wheezing, chest pain, palpitations, orthopnea, edema, abdominal pain, nausea, melena, diarrhea, constipation, flank pain, dysuria, hematuria, urinary  Frequency, nocturia, numbness, tingling, seizures,  Focal weakness, Loss of consciousness,  Tremor, insomnia, depression, anxiety, and suicidal ideation.      Past Medical History:  Diagnosis Date  . Colon polyps   . GERD (gastroesophageal reflux disease)   . Hemorrhoids   . History of blood transfusion   . Osteoporosis     Past Surgical History:  Procedure Laterality Date  . CESAREAN SECTION  [id number]  . CHOLECYSTECTOMY  2008  . COLONOSCOPY  2009  . COLONOSCOPY WITH PROPOFOL N/A 12/10/2015   Procedure: COLONOSCOPY WITH PROPOFOL;  Surgeon: Robert Bellow, MD;  Location: [facility] ENDOSCOPY;  Service: Endoscopy;  Laterality: N/A;  . EYE SURGERY Right   . FRACTURE SURGERY     right tibia  . ORIF ANKLE FRACTURE BIMALLEOLAR  March 2006  . ORIF TIBIA & FIBULA FRACTURES  March 2006    Family History  Problem Relation Age of Onset  . Hypertension Father   . Hypertension Mother   . Hypertension Sister   . Breast cancer Cousin   . Cancer Neg Hx     SOCIAL HX: *  Social History   Socioeconomic History  . Marital status: Divorced    Spouse name: Not on file  . Number of children: Not on file  . Years of education: Not on file  . Highest education level: Not on file  Occupational History  . Not on file  Social Needs  . Financial resource strain: Not hard at all  . Food insecurity    Worry: Never true    Inability: Never true  . Transportation needs    Medical: No    Non-medical: No  Tobacco Use  . Smoking  status: Never Smoker  . Smokeless tobacco: Never Used  Substance and Sexual Activity  . Alcohol use: Yes    Alcohol/week: 1.0 standard drinks    Types: 1 Glasses of wine per week    Comment: ocassional  . Drug use: No  . Sexual activity: Never  Lifestyle  . Physical activity    Days per week: 0 days    Minutes per session: Not on file  . Stress: Not on file  Relationships  . Social Musicianconnections    Talks on phone: Not on file    Gets together: Not on file    Attends religious service: Not on file    Active member of club or organization: Not on file    Attends meetings of clubs or organizations: Not on file    Relationship status: Not on file  Other Topics Concern  . Not on file  Social History Narrative  . Not on file    Current Outpatient Medications:  .  ammonium lactate (AMLACTIN) 12 % lotion, Apply 1 application topically as needed for dry skin., Disp: 400 g, Rfl: 0 .  aspirin 81 MG tablet, Take 81 mg by mouth daily., Disp: , Rfl:  .  Biotin 5000 MCG CAPS, Take 1 capsule by mouth daily., Disp: , Rfl:  .  Calcium Citrate 250 MG TABS, Take 2 tablets by mouth 3 (three) times daily., Disp: , Rfl:  .  Cholecalciferol (VITAMIN D3) 1000 UNITS CAPS, Take 2,000 Units by mouth daily. , Disp: , Rfl:  .  Coenzyme Q10 100 MG TABS, Take 1 tablet by mouth daily., Disp: , Rfl:  .  denosumab (PROLIA) 60 MG/ML SOLN injection, Inject 60 mg into the skin every 6 (six) months. Administer in upper arm, thigh, or abdomen, Disp: , Rfl:  .  GARLIC PO, Take 1 capsule by mouth daily., Disp: , Rfl:  .  Lactobacillus-Inulin (CULTURELLE DIGESTIVE HEALTH) CAPS, Take 1 capsule by mouth daily., Disp: 14 capsule, Rfl: 0 .  Magnesium 250 MG TABS, Take 1 tablet by mouth daily., Disp: , Rfl:  .  Melatonin 5 MG TABS, Take 1 tablet by mouth as needed., Disp: , Rfl:  .  Multiple Vitamins-Minerals (CENTRUM SILVER PO), Take 1 tablet by mouth daily., Disp: , Rfl:  .  Tdap (BOOSTRIX) 5-2.5-18.5 LF-MCG/0.5 injection,  Inject 0.5 mLs into the muscle once., Disp: 0.5 mL, Rfl: 0 .  VALERIAN ROOT PO, Take 1 capsule by mouth as needed., Disp: , Rfl:  .  vitamin B-12 (CYANOCOBALAMIN) 1000 MCG tablet, Take 5,000 mcg by mouth daily. , Disp: , Rfl:  .  vitamin C (ASCORBIC ACID) 500 MG tablet, Take 500 mg by mouth daily., Disp: , Rfl:  .  furosemide (LASIX) 20 MG tablet, Take 1 tablet (20 mg total) by mouth as needed (for fluid retention. not more than twice a week)., Disp: 30 tablet, Rfl: 0  EXAM:   General  impression: alert, cooperative and articulate.  No signs of being in distress  Lungs: speech is fluent sentence length suggests that patient is not short of breath and not punctuated by cough, sneezing or sniffing. Marland Kitchen.   Psych: affect normal.  speech is articulate and non pressured .  Denies suicidal thoughts   ASSESSMENT AND PLAN: Bilateral leg edema In the absence of signs or symptoms (or history ) of heart failure and DVt.  Reassured patient that her edema is likely venous insufficiency,  Aggravated by prolonged sitting and by use of Aleve.  Prn furosemide prescribed,  Not for use more than 2/week     I discussed the assessment and treatment plan with the patient. The patient was provided an opportunity to ask questions and all were answered. The patient agreed with the plan and demonstrated an understanding of the instructions.   The patient was advised to call back or seek an in-person evaluation if the symptoms worsen or if the condition fails to improve as anticipated.  I provided 22 minutes of non-face-to-face time during this encounter.   Sherlene Shamseresa L Lynelle Weiler, MD

## 2018-07-30 DIAGNOSIS — R600 Localized edema: Secondary | ICD-10-CM | POA: Insufficient documentation

## 2018-07-30 DIAGNOSIS — Z7189 Other specified counseling: Secondary | ICD-10-CM | POA: Insufficient documentation

## 2018-07-30 NOTE — Assessment & Plan Note (Signed)
In the absence of signs or symptoms (or history ) of heart failure and DVt.  Reassured patient that her edema is likely venous insufficiency,  Aggravated by prolonged sitting and by use of Aleve.  Prn furosemide prescribed,  Not for use more than 2/week

## 2018-07-30 NOTE — Assessment & Plan Note (Signed)
Educated patient on the newly broadened list of signs and symptoms of COVID-19 infection and ways to avoid the viral infection including washing hands frequently with soap and water,  using hand sanitizer if unable to wash, avoiding touching face,  staying at home and limiting visitors,  and avoiding contact with people coming in and out of home.  Discussed the potential ineffectiveness of hand sanitizer if left in environments > 110 degrees (ie , the car).  Reminded patient to call office with questions/concerns.  The importance of continued social distancing was discussed today . Patient was screened for the development of any unsafe behaviors or habits that may have developed as a result of the social impact of the virus , including alcohol abuse,  Domestic violence, tobacco abuse and overeating.    

## 2018-09-11 ENCOUNTER — Encounter: Payer: Medicare Other | Admitting: Internal Medicine

## 2018-10-25 ENCOUNTER — Telehealth: Payer: Self-pay | Admitting: *Deleted

## 2018-10-25 NOTE — Telephone Encounter (Signed)
Patient is scheduled for Prolia on 11/06/18

## 2018-10-25 NOTE — Telephone Encounter (Signed)
Copied from [place] [phone]. Topic: General - Inquiry >> Oct 25, 2018 10:04 AM Virl Axe D wrote: Reason for CRM: Pt called to see if she can get her Prolia shot on 10/27/18. She has an OV with Dr. Derrel Nip on this day as well. Requesting CB. Please advise.

## 2018-10-26 ENCOUNTER — Other Ambulatory Visit: Payer: Self-pay

## 2018-10-27 ENCOUNTER — Encounter: Payer: Self-pay | Admitting: Internal Medicine

## 2018-10-27 ENCOUNTER — Other Ambulatory Visit: Payer: Self-pay

## 2018-10-27 ENCOUNTER — Ambulatory Visit (INDEPENDENT_AMBULATORY_CARE_PROVIDER_SITE_OTHER): Payer: Medicare Other | Admitting: Internal Medicine

## 2018-10-27 VITALS — BP 180/90 | HR 72 | Temp 97.0°F | Resp 15 | Ht <= 58 in | Wt 112.4 lb

## 2018-10-27 DIAGNOSIS — M48061 Spinal stenosis, lumbar region without neurogenic claudication: Secondary | ICD-10-CM | POA: Diagnosis not present

## 2018-10-27 DIAGNOSIS — M5416 Radiculopathy, lumbar region: Secondary | ICD-10-CM

## 2018-10-27 DIAGNOSIS — Z0001 Encounter for general adult medical examination with abnormal findings: Secondary | ICD-10-CM

## 2018-10-27 DIAGNOSIS — Z0000 Encounter for general adult medical examination without abnormal findings: Secondary | ICD-10-CM

## 2018-10-27 MED ORDER — PREDNISONE 10 MG PO TABS: ORAL_TABLET | ORAL | 0 refills | Status: DC

## 2018-10-27 NOTE — Progress Notes (Signed)
Patient ID: Natalie Coleman, female    DOB: 09/19/1940  Age: 78 y.o. MRN: [id number]  The patient is here for annual prevemtive  examination and management of other chronic and acute problems.   Defers breast exam today Mammogram normal jan 2020   The risk factors are reflected in the social history.  The roster of all physicians providing medical care to patient - is listed in the Snapshot section of the chart.  Activities of daily living:  The patient is 100% independent in all ADLs: dressing, toileting, feeding as well as independent mobility  Home safety : The patient has smoke detectors in the home. They wear seatbelts.  There are no firearms at home. There is no violence in the home.   There is no risks for hepatitis, STDs or HIV. There is no   history of blood transfusion. They have no travel history to infectious disease endemic areas of the world.  The patient has seen their dentist in the last six month. They have seen their eye doctor in the last year. They admit to slight hearing difficulty with regard to whispered voices and some television programs.  They have deferred audiologic testing in the last year.  They do not  have excessive sun exposure. Discussed the need for sun protection: hats, long sleeves and use of sunscreen if there is significant sun exposure.   Diet: the importance of a healthy diet is discussed. They do have a healthy diet.  The benefits of regular aerobic exercise were discussed. She is no longer walking due to constant low back pain with sciatica   Depression screen: there are no signs or vegative symptoms of depression- irritability, change in appetite, anhedonia, sadness/tearfullness.  Cognitive assessment: the patient manages all their financial and personal affairs and is actively engaged. They could relate day,date,year and events; recalled 2/3 objects at 3 minutes; performed clock-face test normally.  The following portions of the patient's  history were reviewed and updated as appropriate: allergies, current medications, past family history, past medical history,  past surgical history, past social history  and problem list.  Visual acuity was not assessed per patient preference since she has regular follow up with her ophthalmologist. Hearing and body mass index were assessed and reviewed.   During the course of the visit the patient was educated and counseled about appropriate screening and preventive services including : fall prevention , diabetes screening, nutrition counseling, colorectal cancer screening, and recommended immunizations.    CC: The primary encounter diagnosis was Chronic lumbar radiculopathy. Diagnoses of Spinal stenosis of lumbar region without neurogenic claudication and Encounter for preventive health examination were also pertinent to this visit.  Chronic back pain described as 9/10 . She has severe lumbar spinal stenosis and degenerative joint diseasee of let hip .had an evaluation by Natalie Coleman in 2018,  Surgery or ESI offered, both deferred by patient.  Now currently  wanting relief from pain but unwilling to take opioids due to fear of addiction.   Follow up on osteoporosis:  Currently taking Prolia,  last DEXA reviewed with patient       [name] has a past medical history of Colon polyps, GERD (gastroesophageal reflux disease), Hemorrhoids, History of blood transfusion, and Osteoporosis.   She has a past surgical history that includes Fracture surgery; Cholecystectomy (2008); ORIF tibia & fibula fractures (March 2006); ORIF ankle fracture bimalleolar (March 2006); Colonoscopy (2009); Eye surgery (Right); Cesarean section ([id number]); and Colonoscopy with propofol (N/A, 12/10/2015).   Her  family history includes Breast cancer in her cousin; Hypertension in her father, mother, and sister.She reports that she has never smoked. She has never used smokeless tobacco. She reports current alcohol use of  about 1.0 standard drinks of alcohol per week. She reports that she does not use drugs.  Outpatient Medications Prior to Visit  Medication Sig Dispense Refill  . ammonium lactate (AMLACTIN) 12 % lotion Apply 1 application topically as needed for dry skin. 400 g 0  . aspirin 81 MG tablet Take 81 mg by mouth daily.    . Biotin 5000 MCG CAPS Take 1 capsule by mouth daily.    . Calcium Citrate 250 MG TABS Take 2 tablets by mouth 3 (three) times daily.    . Cholecalciferol (VITAMIN D3) 1000 UNITS CAPS Take 2,000 Units by mouth daily.     . Coenzyme Q10 100 MG TABS Take 1 tablet by mouth daily.    Marland Kitchen denosumab (PROLIA) 60 MG/ML SOLN injection Inject 60 mg into the skin every 6 (six) months. Administer in upper arm, thigh, or abdomen    . furosemide (LASIX) 20 MG tablet Take 1 tablet (20 mg total) by mouth as needed (for fluid retention. not more than twice a week). 30 tablet 0  . GARLIC PO Take 1 capsule by mouth daily.    . Lactobacillus-Inulin ([place]) CAPS Take 1 capsule by mouth daily. 14 capsule 0  . Magnesium 250 MG TABS Take 1 tablet by mouth daily.    . Melatonin 5 MG TABS Take 1 tablet by mouth as needed.    . Multiple Vitamins-Minerals (CENTRUM SILVER PO) Take 1 tablet by mouth daily.    . Tdap (BOOSTRIX) 5-2.5-18.5 LF-MCG/0.5 injection Inject 0.5 mLs into the muscle once. 0.5 mL 0  . VALERIAN ROOT PO Take 1 capsule by mouth as needed.    . vitamin B-12 (CYANOCOBALAMIN) 1000 MCG tablet Take 5,000 mcg by mouth daily.     . vitamin C (ASCORBIC ACID) 500 MG tablet Take 500 mg by mouth daily.     No facility-administered medications prior to visit.     Review of Systems   Patient denies headache, fevers, malaise, unintentional weight loss, skin rash, eye pain, sinus congestion and sinus pain, sore throat, dysphagia,  hemoptysis , cough, dyspnea, wheezing, chest pain, palpitations, orthopnea, edema, abdominal pain, nausea, melena, diarrhea, constipation, flank pain,  dysuria, hematuria, urinary  Frequency, nocturia, numbness, tingling, seizures,  Focal weakness, Loss of consciousness,  Tremor, insomnia, depression, anxiety, and suicidal ideation.      Objective:  BP (!) 180/90 (BP Location: Right Arm, Patient Position: Sitting, Cuff Size: Normal)   Pulse 72   Temp (!) 97 F (36.1 C) (Temporal)   Resp 15   Ht 4\' 6"  (1.372 m)   Wt 112 lb 6.4 oz (51 kg)   SpO2 97%   BMI 27.10 kg/m   Physical Exam  General appearance: alert, cooperative and appears stated age Ears: normal TM's and external ear canals both ears Throat: lips, mucosa, and tongue normal; teeth and gums normal Neck: no adenopathy, no carotid bruit, supple, symmetrical, trachea midline and thyroid not enlarged, symmetric, no tenderness/mass/nodules Back: symmetric, no curvature. ROM normal. No CVA tenderness. Lungs: clear to auscultation bilaterally Heart: regular rate and rhythm, S1, S2 normal, no murmur, click, rub or gallop Abdomen: soft, non-tender; bowel sounds normal; no masses,  no organomegaly Pulses: 2+ and symmetric Skin: Skin color, texture, turgor normal. No rashes or lesions Lymph nodes: Cervical, supraclavicular,  and axillary nodes normal.   Assessment & Plan:   Problem List Items Addressed This Visit      Unprioritized   Encounter for preventive health examination    age appropriate education and counseling updated, referrals for preventative services and immunizations addressed, dietary and smoking counseling addressed, most recent labs reviewed.  I have personally reviewed and have noted:  1) the patient's medical and social history 2) The pt's use of alcohol, tobacco, and illicit drugs 3) The patient's current medications and supplements 4) Functional ability including ADL's, fall risk, home safety risk, hearing and visual impairment 5) Diet and physical activities 6) Evidence for depression or mood disorder 7) The patient's height, weight, and BMI have been  recorded in the chart  I have made referrals, and provided counseling and education based on review of the above      Chronic lumbar radiculopathy - Primary    Secondary to mild DJD of hip and SEVERE lumbar spinal stenosis with S nerve root contact with bulging disk .  Repeat MRI needed followed by referral to Natalie Officerjeff Jenkins.  She refuses pain medication other than tylenol.  Trial of prednisone        Relevant Orders   Ambulatory referral to Neurosurgery   MR Lumbar Spine Wo Contrast    Other Visit Diagnoses    Spinal stenosis of lumbar region without neurogenic claudication       Relevant Orders   Ambulatory referral to Neurosurgery   MR Lumbar Spine Wo Contrast      I am having Natalie Coleman start on predniSONE. I am also having her maintain her Multiple Vitamins-Minerals (CENTRUM SILVER PO), GARLIC PO, Vitamin D3, vitamin C, vitamin B-12, aspirin, VALERIAN ROOT PO, Melatonin, [employer] [facility], Magnesium, Biotin, Coenzyme Q10, Tdap, denosumab, Calcium Citrate, ammonium lactate, and furosemide.  Meds ordered this encounter  Medications  . predniSONE (DELTASONE) 10 MG tablet    Sig: 6 tablets daily for 3 days,  then reduce by 1 tablet daily until gone    Dispense:  33 tablet    Refill:  0    There are no discontinued medications.  Follow-up: No follow-ups on file.   Sherlene Shamseresa L Jayce Boyko, MD

## 2018-10-27 NOTE — Patient Instructions (Signed)
I am prescribing a short course of prednisone to take for the next 8 days for your back and hip pain  6 tablets all at once IN THE MORNING AFTER BREAKFAST  For 3 days, then begin to taper (reduce ) the dose by 1 tablet daily until gone   Day 1 6 tablets Day 2  6 Day 3 6 Day 4 5 Day 5 4 Day 6 3 Day 7 2 Day 8 1  You will need another mri of the spine to return to Dr Arnoldo Morale.  I have ordered this  I will prescribe a low dose of valium to help you relax for the procedure once it has been scheduled

## 2018-10-29 NOTE — Assessment & Plan Note (Addendum)
Secondary to mild DJD of hip and SEVERE lumbar spinal stenosis with S nerve root contact with bulging disk .  Repeat MRI needed followed by referral to [facility].  She refuses pain medication other than tylenol.  Trial of prednisone

## 2018-10-29 NOTE — Assessment & Plan Note (Signed)

## 2018-11-06 ENCOUNTER — Ambulatory Visit (INDEPENDENT_AMBULATORY_CARE_PROVIDER_SITE_OTHER): Payer: Medicare Other | Admitting: *Deleted

## 2018-11-06 ENCOUNTER — Other Ambulatory Visit: Payer: Self-pay

## 2018-11-06 DIAGNOSIS — M810 Age-related osteoporosis without current pathological fracture: Secondary | ICD-10-CM | POA: Diagnosis not present

## 2018-11-06 MED ORDER — DENOSUMAB 60 MG/ML ~~LOC~~ SOSY
60.0000 mg | PREFILLED_SYRINGE | Freq: Once | SUBCUTANEOUS | Status: AC
  Administered 2018-11-06: 10:00:00 60 mg via SUBCUTANEOUS

## 2018-11-06 NOTE — Progress Notes (Signed)
Patient presented for Prolia injection to Left arm [place], patient voiced no concerns or complaints during or after injection. 

## 2018-11-14 ENCOUNTER — Ambulatory Visit: Payer: Medicare Other

## 2019-03-13 ENCOUNTER — Telehealth: Payer: Self-pay | Admitting: Internal Medicine

## 2019-03-13 NOTE — Telephone Encounter (Signed)
Pt called in and wanted to know when is her next Prolia injection. Her first one was on 11/06/18. She also wanted to know about her physical. I told her she had one on 10/27/18 but she still had questions.

## 2019-03-14 NOTE — Telephone Encounter (Signed)
Pt called back and I scheduled her for 05/08/19.

## 2019-03-14 NOTE — Telephone Encounter (Signed)
Tried to call patient to advise Prolia in not due until 05/06/19, no answer voicemail is full.

## 2019-03-29 ENCOUNTER — Telehealth: Payer: Self-pay | Admitting: Internal Medicine

## 2019-03-29 NOTE — Telephone Encounter (Signed)
Called and notified patient I need copy of new insurance to file for her Prolia injection. Patient will bring new card to office when rain stops 03/30/19 or 04/02/19

## 2019-04-03 NOTE — Telephone Encounter (Signed)
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

## 2019-04-10 NOTE — Telephone Encounter (Signed)
Cletus Gash Key: [id number] - PA Case ID: [id number]

## 2019-04-11 NOTE — Telephone Encounter (Signed)
-----   Message from Dennie Bible, RN sent at 11/16/2018  3:11 PM EDT ----- Regarding: Prolia Check verification schedule Prolia 05/06/19

## 2019-04-11 NOTE — Telephone Encounter (Signed)
Insurance PA approved from 04/10/19- 02/15/20

## 2019-05-08 ENCOUNTER — Other Ambulatory Visit: Payer: Self-pay

## 2019-05-08 ENCOUNTER — Ambulatory Visit (INDEPENDENT_AMBULATORY_CARE_PROVIDER_SITE_OTHER): Payer: Medicare PPO | Admitting: *Deleted

## 2019-05-08 DIAGNOSIS — M810 Age-related osteoporosis without current pathological fracture: Secondary | ICD-10-CM | POA: Diagnosis not present

## 2019-05-08 MED ORDER — DENOSUMAB 60 MG/ML ~~LOC~~ SOSY
60.0000 mg | PREFILLED_SYRINGE | Freq: Once | SUBCUTANEOUS | Status: AC
  Administered 2019-05-08: 60 mg via SUBCUTANEOUS

## 2019-05-08 NOTE — Progress Notes (Addendum)
Patient presented for Prolia injection to left arm [place], patient voiced no concerns or complaints during or after injection.  Reviewed.  Dr Scott 

## 2019-06-06 ENCOUNTER — Ambulatory Visit: Payer: Medicare Other

## 2019-06-13 ENCOUNTER — Ambulatory Visit (INDEPENDENT_AMBULATORY_CARE_PROVIDER_SITE_OTHER): Payer: Medicare PPO

## 2019-06-13 VITALS — Ht <= 58 in | Wt 112.0 lb

## 2019-06-13 DIAGNOSIS — Z0000 Encounter for general adult medical examination without abnormal findings: Secondary | ICD-10-CM

## 2019-06-13 NOTE — Patient Instructions (Addendum)
  Natalie Coleman , Thank you for taking time to come for your Medicare Wellness Visit. I appreciate your ongoing commitment to your health goals. Please review the following plan we discussed and let me know if I can assist you in the future.   These are the goals we discussed: Goals      Patient Stated   .  Complete Advanced Directive  (pt-stated)     Living Will/HCPOA; plans to discuss with daughter    . I would like walk more for exercise (pt-stated)     Stretch and walk for exercise more as tolerated       This is a list of the screening recommended for you and due dates:  Health Maintenance  Topic Date Due  . Tetanus Vaccine  08/16/2014  . COVID-19 Vaccine (1) 06/29/2019*  . DEXA scan (bone density measurement)  Completed  . Pneumonia vaccines  Completed  . Flu Shot  Discontinued  *Topic was postponed. The date shown is not the original due date.

## 2019-06-13 NOTE — Progress Notes (Addendum)
Subjective:   Natalie Coleman is a 79 y.o. female who presents for Medicare Annual (Subsequent) preventive examination.  Review of Systems:  No ROS.  Medicare Wellness Virtual Visit.  Visual/audio telehealth visit, UTA vital signs.   Ht/Wt provided. See social history for additional risk factors.  Cardiac Risk Factors include: advanced age (>63men, >66 women)     Objective:     Vitals: Ht 4\' 6"  (1.372 m)   Wt 112 lb (50.8 kg)   BMI 27.00 kg/m   Body mass index is 27 kg/m.  Advanced Directives 06/13/2019 06/05/2018 05/03/2017 02/02/2016 12/30/2014  Does Patient Have a Medical Advance Directive? No No No No No  Would patient like information on creating a medical advance directive? No - Patient declined No - Patient declined No - Patient declined No - Patient declined Yes - 01/01/2015 given    Tobacco Social History   Tobacco Use  Smoking Status Never Smoker  Smokeless Tobacco Never Used     Counseling given: Not Answered   Clinical Intake:  Pre-visit preparation completed: Yes        Diabetes: No  How often do you need to have someone help you when you read instructions, pamphlets, or other written materials from your doctor or pharmacy?: 1 - Never  Interpreter Needed?: No     Past Medical History:  Diagnosis Date  . Colon polyps   . GERD (gastroesophageal reflux disease)   . Hemorrhoids   . History of blood transfusion   . Osteoporosis    Past Surgical History:  Procedure Laterality Date  . CESAREAN SECTION  Transport planner  . CHOLECYSTECTOMY  2008  . COLONOSCOPY  2009  . COLONOSCOPY WITH PROPOFOL N/A 12/10/2015   Procedure: COLONOSCOPY WITH PROPOFOL;  Surgeon: 12/12/2015, MD;  Location: [name] ENDOSCOPY;  Service: Endoscopy;  Laterality: N/A;  . EYE SURGERY Right   . FRACTURE SURGERY     right tibia  . ORIF ANKLE FRACTURE BIMALLEOLAR  March 2006  . ORIF TIBIA & FIBULA FRACTURES  March 2006   Family History  Problem Relation Age  of Onset  . Hypertension Father   . Hypertension Mother   . Hypertension Sister   . Breast cancer Cousin   . Cancer Neg Hx    Social History   Socioeconomic History  . Marital status: Divorced    Spouse name: Not on file  . Number of children: Not on file  . Years of education: Not on file  . Highest education level: Not on file  Occupational History  . Not on file  Tobacco Use  . Smoking status: Never Smoker  . Smokeless tobacco: Never Used  Substance and Sexual Activity  . Alcohol use: Yes    Alcohol/week: 1.0 standard drinks    Types: 1 Glasses of wine per week    Comment: ocassional  . Drug use: No  . Sexual activity: Never  Other Topics Concern  . Not on file  Social History Narrative  . Not on file   Social Determinants of Health   Financial Resource Strain:   . Difficulty of Paying Living Expenses:   Food Insecurity:   . Worried About April 2006 in the Last Year:   . Programme researcher, broadcasting/film/video in the Last Year:   Transportation Needs:   . Barista (Medical):   Freight forwarder Lack of Transportation (Non-Medical):   Physical Activity:   . Days of Exercise per Week:   . Minutes of  Exercise per Session:   Stress:   . Feeling of Stress :   Social Connections:   . Frequency of Communication with Friends and Family:   . Frequency of Social Gatherings with Friends and Family:   . Attends Religious Services:   . Active Member of Clubs or Organizations:   . Attends Banker Meetings:   Marland Kitchen Marital Status:     Outpatient Encounter Medications as of 06/13/2019  Medication Sig  . ammonium lactate (AMLACTIN) 12 % lotion Apply 1 application topically as needed for dry skin.  Marland Kitchen aspirin 81 MG tablet Take 81 mg by mouth daily.  . Biotin 5000 MCG CAPS Take 1 capsule by mouth daily.  . Calcium Citrate 250 MG TABS Take 2 tablets by mouth 3 (three) times daily.  . Cholecalciferol (VITAMIN D3) 1000 UNITS CAPS Take 2,000 Units by mouth daily.   . Coenzyme Q10 100  MG TABS Take 1 tablet by mouth daily.  Marland Kitchen denosumab (PROLIA) 60 MG/ML SOLN injection Inject 60 mg into the skin every 6 (six) months. Administer in upper arm, thigh, or abdomen  . GARLIC PO Take 1 capsule by mouth daily.  . Lactobacillus-Inulin ([employer]) CAPS Take 1 capsule by mouth daily.  . Magnesium 250 MG TABS Take 1 tablet by mouth daily.  . Melatonin 5 MG TABS Take 1 tablet by mouth as needed.  . Multiple Vitamins-Minerals (CENTRUM SILVER PO) Take 1 tablet by mouth daily.  Marland Kitchen VALERIAN ROOT PO Take 1 capsule by mouth as needed.  . vitamin B-12 (CYANOCOBALAMIN) 1000 MCG tablet Take 5,000 mcg by mouth daily.   . vitamin C (ASCORBIC ACID) 500 MG tablet Take 500 mg by mouth daily.  . furosemide (LASIX) 20 MG tablet Take 1 tablet (20 mg total) by mouth as needed (for fluid retention. not more than twice a week). (Patient not taking: Reported on 06/13/2019)  . Tdap (BOOSTRIX) 5-2.5-18.5 LF-MCG/0.5 injection Inject 0.5 mLs into the muscle once.  . [DISCONTINUED] predniSONE (DELTASONE) 10 MG tablet 6 tablets daily for 3 days,  then reduce by 1 tablet daily until gone   No facility-administered encounter medications on file as of 06/13/2019.    Activities of Daily Living In your present state of health, do you have any difficulty performing the following activities: 06/13/2019  Hearing? N  Vision? N  Difficulty concentrating or making decisions? N  Walking or climbing stairs? Y  Dressing or bathing? N  Doing errands, shopping? N  Preparing Food and eating ? N  Using the Toilet? N  In the past six months, have you accidently leaked urine? N  Do you have problems with loss of bowel control? N  Managing your Medications? N  Managing your Finances? N  Housekeeping or managing your Housekeeping? Y  Comment Maid assist once monthly  Some recent data might be hidden    Patient Care Team: Sherlene Shams, MD as PCP - General (Internal Medicine) Lemar Livings, Merrily Pew, MD (General  Surgery) Sherlene Shams, MD (Internal Medicine)    Assessment:   This is a routine wellness examination for [name].  Nurse connected with patient 06/13/19 at  9:30 AM EDT by a telephone enabled telemedicine application and verified that I am speaking with the correct person using two identifiers. Patient stated full name and DOB. Patient gave permission to continue with virtual visit. Patient's location was at home and Nurse's location was at [place] office.   Patient is alert and oriented x3. Patient denies difficulty  focusing or concentrating. Patient likes to read daily for brain health.   Health Maintenance Due: -Tdap vaccine- discussed; to be completed with doctor in visit or local pharmacy.   -Covid vaccine- declined See completed HM at the end of note.   Eye: Visual acuity not assessed. Virtual visit. Followed by their ophthalmologist.  Dental: Visits every 6 months.    Hearing: Demonstrates normal hearing during visit.  Safety:  Patient feels safe at home- yes Patient does have smoke detectors at home- yes Patient does wear sunscreen or protective clothing when in direct sunlight - yes Patient does wear seat belt when in a moving vehicle - yes Patient drives- yes Adequate lighting in walkways free from debris- yes Grab bars and handrails used as appropriate- yes Ambulates with an assistive device- yes; rollator walker Cell phone on person when ambulating outside of the home- yes  Social: Alcohol intake - yes, occ  Smoking history- never   Smokers in home? none Illicit drug use? none  Medication: Taking as directed and without issues.  Self managed - yes   Covid-19: Precautions and sickness symptoms discussed. Wears mask, social distancing, hand hygiene as appropriate.   Activities of Daily Living Patient denies needing assistance with: feeding themselves, getting from bed to chair, getting to the toilet, bathing/showering, dressing, managing money, or  preparing meals.  Maid assist with household chores once monthly.   Discussed the importance of a healthy diet, water intake and the benefits of aerobic exercise.   Physical activity- walking  Diet:  Regular Water: fair intake Caffeine: 1 cup of coffee  Other Providers Patient Care Team: Crecencio Mc, MD as PCP - General (Internal Medicine) Bary Castilla, Forest Gleason, MD (General Surgery) Crecencio Mc, MD (Internal Medicine)  Exercise Activities and Dietary recommendations Current Exercise Habits: Home exercise routine, Type of exercise: walking, Intensity: Mild  Goals      Patient Stated   .  Complete Advanced Directive  (pt-stated)     Living Will/HCPOA; plans to discuss with daughter    . I would like walk more for exercise (pt-stated)     Stretch and walk for exercise more as tolerated       Fall Risk Fall Risk  06/13/2019 10/27/2018 06/05/2018 05/03/2017 02/02/2016  Falls in the past year? 0 0 0 No No  Risk for fall due to : Impaired balance/gait Impaired balance/gait;Impaired mobility - - -  Follow up Falls evaluation completed Falls evaluation completed - - -  Timed Get Up and Go performed: no, virtual visit  Depression Screen PHQ 2/9 Scores 06/13/2019 06/05/2018 07/17/2017 05/03/2017  PHQ - 2 Score 1 0 - 2  PHQ- 9 Score - - - 9  Exception Documentation - - (No Data) -     Cognitive Function MMSE - Mini Mental State Exam 12/30/2014  Orientation to time 5  Orientation to Place 5  Registration 3  Attention/ Calculation 5  Recall 3  Language- name 2 objects 2  Language- repeat 1  Language- follow 3 step command 3  Language- read & follow direction 1  Write a sentence 1  Copy design 1  Total score 30     6CIT Screen 06/13/2019 06/05/2018 05/03/2017  What Year? 0 points 0 points 0 points  What month? 0 points 0 points 0 points  What time? 0 points 0 points 0 points  Count back from 20 - 0 points 0 points  Months in reverse - 0 points 0 points  Repeat phrase - 0  points 0 points  Total Score - 0 0    Immunization History  Administered Date(s) Administered  . Pneumococcal Conjugate-13 08/15/2012  . Pneumococcal Polysaccharide-23 08/17/2004, 01/13/2015  . Td 08/15/2004  . Zoster 01/18/2014   Screening Tests Health Maintenance  Topic Date Due  . TETANUS/TDAP  08/16/2014  . COVID-19 Vaccine (1) 06/29/2019 (Originally 10/25/1956)  . DEXA SCAN  Completed  . PNA vac Low Risk Adult  Completed  . INFLUENZA VACCINE  Discontinued      Plan:   Keep all routine maintenance appointments.   Follow up 06/28/19 @ 9:00; fasting for labs  Medicare Attestation I have personally reviewed: The patient's medical and social history Their use of alcohol, tobacco or illicit drugs Their current medications and supplements The patient's functional ability including ADLs,fall risks, home safety risks, cognitive, and hearing and visual impairment Diet and physical activities Evidence for depression   I have reviewed and discussed with patient certain preventive protocols, quality metrics, and best practice recommendations.      OBrien-Blaney, Aleem Elza L, LPN  4/81/8563    I have reviewed the above information and agree with above.   Duncan Dull, MD

## 2019-06-27 ENCOUNTER — Ambulatory Visit: Payer: Medicare PPO | Admitting: Internal Medicine

## 2019-06-28 ENCOUNTER — Encounter: Payer: Self-pay | Admitting: Internal Medicine

## 2019-06-28 ENCOUNTER — Other Ambulatory Visit: Payer: Self-pay

## 2019-06-28 ENCOUNTER — Ambulatory Visit: Payer: Medicare PPO | Admitting: Internal Medicine

## 2019-06-28 VITALS — BP 132/82 | HR 69 | Temp 97.6°F | Resp 15 | Ht <= 58 in | Wt 110.0 lb

## 2019-06-28 DIAGNOSIS — M5416 Radiculopathy, lumbar region: Secondary | ICD-10-CM | POA: Diagnosis not present

## 2019-06-28 DIAGNOSIS — E119 Type 2 diabetes mellitus without complications: Secondary | ICD-10-CM | POA: Diagnosis not present

## 2019-06-28 DIAGNOSIS — M810 Age-related osteoporosis without current pathological fracture: Secondary | ICD-10-CM | POA: Diagnosis not present

## 2019-06-28 DIAGNOSIS — F411 Generalized anxiety disorder: Secondary | ICD-10-CM | POA: Diagnosis not present

## 2019-06-28 DIAGNOSIS — Z1231 Encounter for screening mammogram for malignant neoplasm of breast: Secondary | ICD-10-CM

## 2019-06-28 DIAGNOSIS — R5383 Other fatigue: Secondary | ICD-10-CM | POA: Diagnosis not present

## 2019-06-28 LAB — COMPREHENSIVE METABOLIC PANEL
ALT: 16 U/L (ref 0–35)
AST: 20 U/L (ref 0–37)
Albumin: 4.1 g/dL (ref 3.5–5.2)
Alkaline Phosphatase: 64 U/L (ref 39–117)
BUN: 22 mg/dL (ref 6–23)
CO2: 28 mEq/L (ref 19–32)
Calcium: 9.2 mg/dL (ref 8.4–10.5)
Chloride: 103 mEq/L (ref 96–112)
Creatinine, Ser: 0.74 mg/dL (ref 0.40–1.20)
GFR: 75.38 mL/min (ref >60.00)
Glucose, Bld: 87 mg/dL (ref 70–99)
Potassium: 4.3 mEq/L (ref 3.5–5.1)
Sodium: 137 mEq/L (ref 135–145)
Total Bilirubin: 0.5 mg/dL (ref 0.2–1.2)
Total Protein: 6.5 g/dL (ref 6.0–8.3)

## 2019-06-28 LAB — TSH: TSH: 1.51 u[IU]/mL (ref 0.35–4.50)

## 2019-06-28 LAB — VITAMIN D 25 HYDROXY (VIT D DEFICIENCY, FRACTURES): VITD: 95.19 ng/mL (ref 30.00–100.00)

## 2019-06-28 NOTE — Progress Notes (Signed)
Subjective:  Patient ID: Natalie Coleman, female    DOB: Dec 18, 1940  Age: 79 y.o. MRN: [id number]  CC: The primary encounter diagnosis was Osteoporosis, postmenopausal. Diagnoses of Controlled type 2 diabetes mellitus without complication, without long-term current use of insulin (HCC), Fatigue, unspecified type, Anxiety state, Chronic lumbar radiculopathy, and Encounter for screening mammogram for malignant neoplasm of breast were also pertinent to this visit.  HPI AMARYS SLIWINSKI presents for follow up on chronic conditions.  This visit occurred during the SARS-CoV-2 public health emergency.  Safety protocols were in place, including screening questions prior to the visit, additional usage of staff PPE, and extensive cleaning of exam room while observing appropriate contact time as indicated for disinfecting solutions.    Patient has NOT  received any doses of the available COVID 19 vaccine without complications.  Patient continues to mask when outside of the home except when walking in yard or at safe distances from others .  Patient denies any change in mood or development of unhealthy behaviors resuting from the pandemic's restriction of activities and socialization.    Ms Schoch is an 79 yr old woman who emigrated from [place] many many years ago.  She lives alone,  But has a daughter  who recently relocated from [place] be closer to her .    She is cheerful initially but becomes tearful when asked how she is doing. She remains very worried about the recent change in the national political landscape following the defeat of President Trump and the election of Biden.  She is alarmed by the unlawful immigration that is occurring and worried a bout the future that awaits her grandchildren. She has been raised Catholic but has stopped attending Mass because of the political comments made weekly by the current priest. She and her daughter are looking for fellowship in a  church.  She is not losing any sleep, however,  And is maintianing her weight .    Spinal stenosis:  Her back pain resolved with use of a nationally available OTC medication   Osteoporosis:  She has been receiving Prolia injections without side effects   Outpatient Medications Prior to Visit  Medication Sig Dispense Refill  . ammonium lactate (AMLACTIN) 12 % lotion Apply 1 application topically as needed for dry skin. 400 g 0  . aspirin 81 MG tablet Take 81 mg by mouth daily.    . Calcium Citrate 250 MG TABS Take 2 tablets by mouth 3 (three) times daily.    . Coenzyme Q10 100 MG TABS Take 1 tablet by mouth daily.    Marland Kitchen denosumab (PROLIA) 60 MG/ML SOLN injection Inject 60 mg into the skin every 6 (six) months. Administer in upper arm, thigh, or abdomen    . GARLIC PO Take 1 capsule by mouth daily.    . Lactobacillus-Inulin (CULTURELLE DIGESTIVE HEALTH) CAPS Take 1 capsule by mouth daily. 14 capsule 0  . Melatonin 5 MG TABS Take 1 tablet by mouth as needed.    . Multiple Vitamins-Minerals (CENTRUM SILVER PO) Take 1 tablet by mouth daily.    Marland Kitchen VALERIAN ROOT PO Take 1 capsule by mouth as needed.    . vitamin B-12 (CYANOCOBALAMIN) 1000 MCG tablet Take 5,000 mcg by mouth daily.     . vitamin C (ASCORBIC ACID) 500 MG tablet Take 500 mg by mouth daily.    . Cholecalciferol (VITAMIN D3) 1000 UNITS CAPS Take 2,000 Units by mouth daily.     Marland Kitchen  furosemide (LASIX) 20 MG tablet Take 1 tablet (20 mg total) by mouth as needed (for fluid retention. not more than twice a week). (Patient not taking: Reported on 06/13/2019) 30 tablet 0  . Biotin 5000 MCG CAPS Take 1 capsule by mouth daily.    . Magnesium 250 MG TABS Take 1 tablet by mouth daily.    . Tdap (BOOSTRIX) 5-2.5-18.5 LF-MCG/0.5 injection Inject 0.5 mLs into the muscle once. (Patient not taking: Reported on 06/28/2019) 0.5 mL 0   No facility-administered medications prior to visit.    Review of Systems;  Patient denies headache, fevers, malaise,  unintentional weight loss, skin rash, eye pain, sinus congestion and sinus pain, sore throat, dysphagia,  hemoptysis , cough, dyspnea, wheezing, chest pain, palpitations, orthopnea, edema, abdominal pain, nausea, melena, diarrhea, constipation, flank pain, dysuria, hematuria, urinary  Frequency, nocturia, numbness, tingling, seizures,  Focal weakness, Loss of consciousness,  Tremor, insomnia, depression, anxiety, and suicidal ideation.      Objective:  BP 132/82 (BP Location: Left Arm, Patient Position: Sitting, Cuff Size: Normal)   Pulse 69   Temp 97.6 F (36.4 C) (Temporal)   Resp 15   Ht 4\' 6"  (1.372 m)   Wt 110 lb (49.9 kg)   SpO2 97%   BMI 26.52 kg/m   BP Readings from Last 3 Encounters:  06/28/19 132/82  10/27/18 (!) 180/90  03/10/18 124/86    Wt Readings from Last 3 Encounters:  06/28/19 110 lb (49.9 kg)  06/13/19 112 lb (50.8 kg)  10/27/18 112 lb 6.4 oz (51 kg)    General appearance: alert, cooperative and appears stated age Ears: normal TM's and external ear canals both ears Throat: lips, mucosa, and tongue normal; teeth and gums normal Neck: no adenopathy, no carotid bruit, supple, symmetrical, trachea midline and thyroid not enlarged, symmetric, no tenderness/mass/nodules Back: symmetric, no curvature. ROM normal. No CVA tenderness. Lungs: clear to auscultation bilaterally Heart: regular rate and rhythm, S1, S2 normal, no murmur, click, rub or gallop Abdomen: soft, non-tender; bowel sounds normal; no masses,  no organomegaly Pulses: 2+ and symmetric Skin: Skin color, texture, turgor normal. No rashes or lesions Lymph nodes: Cervical, supraclavicular, and axillary nodes normal. Psych: affect mildly anxious , makes good eye contact. No fidgeting,  Cries/ Smiles easily.  Denies suicidal thoughts   No results found for: HGBA1C  Lab Results  Component Value Date   CREATININE 0.74 06/28/2019   CREATININE 0.70 03/10/2018   CREATININE 0.78 07/14/2017    Lab  Results  Component Value Date   WBC 7.1 07/14/2017   HGB 13.8 07/14/2017   HCT 41.3 07/14/2017   PLT 250.0 07/14/2017   GLUCOSE 87 06/28/2019   CHOL 228 (H) 07/14/2017   TRIG 108.0 07/14/2017   HDL 67.50 07/14/2017   LDLDIRECT 115.0 01/16/2016   LDLCALC 139 (H) 07/14/2017   ALT 16 06/28/2019   AST 20 06/28/2019   NA 137 06/28/2019   K 4.3 06/28/2019   CL 103 06/28/2019   CREATININE 0.74 06/28/2019   BUN 22 06/28/2019   CO2 28 06/28/2019   TSH 1.51 06/28/2019   MM 3D SCREEN BREAST BILATERAL  Result Date: 03/01/2018 CLINICAL DATA:  Screening. EXAM: DIGITAL SCREENING BILATERAL MAMMOGRAM WITH TOMO AND CAD COMPARISON:  Previous exam(s). ACR Breast Density Category c: The breast tissue is heterogeneously dense, which may obscure small masses. FINDINGS: There are no findings suspicious for malignancy. Images were processed with CAD. IMPRESSION: No mammographic evidence of malignancy. A result letter of this screening mammogram  will be mailed directly to the patient. RECOMMENDATION: Screening mammogram in one year. (Code:SM-B-01Y) BI-RADS CATEGORY  1: Negative. Electronically Signed   By: Margarette Canada M.D.   On: 03/01/2018 14:42    Assessment & Plan:   Problem List Items Addressed This Visit      Unprioritized   Anxiety state    Symptoms are now situational and have  improved since her daughter relocated from [place] to [place].  She prefers to avoid prescribed medications       Chronic lumbar radiculopathy    Secondary to mild DJD of hip and SEVERE lumbar spinal stenosis with S nerve root contact with bulging disk .She currently endorses no pain refuses pain medication other than tylenol.       Osteoporosis, postmenopausal - Primary    Now managed with Prolia after 5 years of bisphosponate therapy,  No history of fragility fractures.  Vitamin D level is high normal on 2000 Ius daily.  Will suspend supplement for summer and resume at 1000 Ius in the Fall       Relevant Orders    VITAMIN D 25 Hydroxy (Vit-D Deficiency, Fractures) (Completed)   Screening for breast cancer    Mammogram ordered.        Other Visit Diagnoses    Controlled type 2 diabetes mellitus without complication, without long-term current use of insulin ([place])       Relevant Orders   Comprehensive metabolic panel (Completed)   Fatigue, unspecified type       Relevant Orders   TSH (Completed)      I have discontinued Lee-Ann C. Bagnell's Vitamin D3, Magnesium, Biotin, and Tdap. I am also having her maintain her Multiple Vitamins-Minerals (CENTRUM SILVER PO), GARLIC PO, vitamin C, vitamin B-12, aspirin, VALERIAN ROOT PO, melatonin, Culturelle Digestive Health, Coenzyme Q10, denosumab, Calcium Citrate, ammonium lactate, and furosemide.  No orders of the defined types were placed in this encounter.   Medications Discontinued During This Encounter  Medication Reason  . Biotin 5000 MCG CAPS Patient Preference  . Magnesium 250 MG TABS Patient Preference  . Tdap (BOOSTRIX) 5-2.5-18.5 LF-MCG/0.5 injection Completed Course  . Cholecalciferol (VITAMIN D3) 1000 UNITS CAPS     Follow-up: No follow-ups on file.   Crecencio Mc, MD

## 2019-06-28 NOTE — Patient Instructions (Signed)
You are doing well!  I'm glad your back is not hurting    DO NOT BE ANXIOUS.  IT IS NOT BIBLICALLY ADVISED!!!    "Be anxious for nothing,  But in everything , by prayer and supplication , let your request by made known to God. And the peace of God which surpasses all understanding, shall guard your hearts and minds through [employer]."

## 2019-06-30 ENCOUNTER — Other Ambulatory Visit: Payer: Self-pay | Admitting: Internal Medicine

## 2019-06-30 DIAGNOSIS — Z1231 Encounter for screening mammogram for malignant neoplasm of breast: Secondary | ICD-10-CM

## 2019-06-30 NOTE — Assessment & Plan Note (Signed)
Mammogram ordered

## 2019-06-30 NOTE — Assessment & Plan Note (Signed)
Secondary to mild DJD of hip and SEVERE lumbar spinal stenosis with S nerve root contact with bulging disk .She currently endorses no pain refuses pain medication other than tylenol.

## 2019-06-30 NOTE — Assessment & Plan Note (Signed)
Symptoms are now situational and have  improved since her daughter relocated from [place] to [place].  She prefers to avoid prescribed medications

## 2019-06-30 NOTE — Assessment & Plan Note (Addendum)
Now managed with Prolia after 5 years of bisphosponate therapy,  No history of fragility fractures.  Vitamin D level is high normal on 2000 Ius daily.  Will suspend supplement for summer and resume at 1000 Ius in the Fall

## 2019-07-19 ENCOUNTER — Telehealth: Payer: Self-pay | Admitting: Internal Medicine

## 2019-07-19 NOTE — Telephone Encounter (Signed)
Pt called wanted to know when to start back taking D3 vitamins and also wanted a copy of her blood work.

## 2019-07-20 NOTE — Telephone Encounter (Signed)
My result note from May 18 , which you read to her,  said to stop Vitamin D in the summer and resume in the Fall (so,  Tell her october)  Ok to send her the labs per request

## 2019-07-25 NOTE — Telephone Encounter (Signed)
Pt called back to get lab results and discuss her Vit D

## 2019-07-25 NOTE — Telephone Encounter (Signed)
Spoke with pt and informed her that should stop taking the vitamin d until October. Then in October she needs to start back taking the vitamin d. Also let pt know that we have mailed her a copy of her lab results. Pt gave a verbal understanding.

## 2019-08-23 ENCOUNTER — Telehealth: Payer: Self-pay

## 2019-08-23 NOTE — Telephone Encounter (Signed)
No, she is  aLSO not a diabetic!  These lists are dubious

## 2019-08-23 NOTE — Telephone Encounter (Signed)
According to [facility] care gaps pt is overdue for an A1c. Is it okay to order and if so is there anything else that needs to be ordered?

## 2019-09-03 ENCOUNTER — Ambulatory Visit: Payer: Medicare PPO | Admitting: Family Medicine

## 2019-09-03 ENCOUNTER — Encounter: Payer: Self-pay | Admitting: Family Medicine

## 2019-09-03 ENCOUNTER — Other Ambulatory Visit: Payer: Self-pay

## 2019-09-03 ENCOUNTER — Telehealth: Payer: Self-pay | Admitting: Internal Medicine

## 2019-09-03 DIAGNOSIS — H109 Unspecified conjunctivitis: Secondary | ICD-10-CM | POA: Insufficient documentation

## 2019-09-03 DIAGNOSIS — H1031 Unspecified acute conjunctivitis, right eye: Secondary | ICD-10-CM

## 2019-09-03 NOTE — Patient Instructions (Addendum)
You could try refresh liquigel or a similar moisturizing gel eye drop to see if that helps with your symptoms. This is best used at night. Please use warm compresses in the morning to help resolve any discharge or matting.  If you continue to have symptoms please see an eye doctor to get their input.  If you develop vision changes or eye pain please seek medical attention immediately.

## 2019-09-03 NOTE — Assessment & Plan Note (Signed)
Patient with mild symptoms of conjunctivitis.  Possibly atopic.  Less likely a viral cause given lack of significant drainage.  No significant drainage to indicate a bacterial cause.  Discussed that this was unlikely related to her prior cataract surgery which occurred 16 years ago.  Discussed that an antibiotic eyedrop would not provide any benefit.  Discussed moisturizing eyedrops.  If not improving over the next several days she is opted to see an optometrist.

## 2019-09-03 NOTE — Telephone Encounter (Signed)
Pt states that her eye is hurting and pt was crying. I offered pt an appt with another provider and she declined. She states that she only wants to see Dr Darrick Huntsman and wants in office only. Please call back

## 2019-09-03 NOTE — Telephone Encounter (Signed)
Spoke with pt and she has been scheduled with Dr. Birdie Sons today at 1:15pm.

## 2019-09-03 NOTE — Progress Notes (Addendum)
  Marikay Alar, MD Phone: [phone]  Natalie Coleman is a 79 y.o. female who presents today for same day visit.   Right eye drainage: Patient noted this this morning.  She woke up and her right eye seem to be matted shut.  She splashed water on it and she was able to get her eye to open.  She notes no eye pain. No vision changes.  No itchiness.  No scratchiness.  She notes over the last couple of weeks her eyes felt a little off though she has trouble describing what this means.  No fevers.  Now she has no issues. She wonders if this could be related to a cataract surgery she had 15 or so years ago.   Social History   Tobacco Use  Smoking Status Never Smoker  Smokeless Tobacco Never Used     ROS see history of present illness  Objective  Physical Exam Vitals:   09/03/19 1321  BP: 130/70  Pulse: 64  Temp: 98.6 F (37 C)  SpO2: 98%    BP Readings from Last 3 Encounters:  09/03/19 130/70  06/28/19 132/82  10/27/18 (!) 180/90   Wt Readings from Last 3 Encounters:  09/03/19 109 lb 9.6 oz (49.7 kg)  06/28/19 110 lb (49.9 kg)  06/13/19 112 lb (50.8 kg)    Physical Exam Eyes:     Pupils: Pupils are equal, round, and reactive to light.     Comments: Minimal right conjunctival erythema, mild wateriness, no significant discharge otherwise,there is no evidence of a foreign body under her eyelids on the right, left conjunctive is normal   Vision screening intact. See vision screening tab.    Assessment/Plan: Please see individual problem list.  Conjunctivitis Patient with mild symptoms of conjunctivitis.  Possibly atopic.  Less likely a viral cause given lack of significant drainage.  No significant drainage to indicate a bacterial cause.  Discussed that this was unlikely related to her prior cataract surgery which occurred 16 years ago.  Discussed that an antibiotic eyedrop would not provide any benefit.  Discussed moisturizing eyedrops.  If not improving over the  next several days she is opted to see an optometrist.    No orders of the defined types were placed in this encounter.   No orders of the defined types were placed in this encounter.   This visit occurred during the SARS-CoV-2 public health emergency.  Safety protocols were in place, including screening questions prior to the visit, additional usage of staff PPE, and extensive cleaning of exam room while observing appropriate contact time as indicated for disinfecting solutions.    Marikay Alar, MD [facility]

## 2019-09-04 NOTE — Telephone Encounter (Signed)
Spoke with pt and she stated that her eye "is better just would like a another doctor to look deeper to see what happened". Pt is aware that the referral has been placed and she was advised that if she does not hear anything in a week to please give [place] a call so we can check on the referral.

## 2019-09-04 NOTE — Telephone Encounter (Signed)
I am happy to refer her. Did she start having symptoms again after our visit? What symptoms is she currently having? I will go ahead and place a referral.

## 2019-09-04 NOTE — Telephone Encounter (Signed)
Pt saw Dr. Birdie Sons yesterday for the eye issue.

## 2019-09-04 NOTE — Telephone Encounter (Signed)
Pt called in stated that she wants Dr.Tullo to refer her to a eye specialist for her eye

## 2019-09-04 NOTE — Telephone Encounter (Signed)
Noted  

## 2019-09-14 ENCOUNTER — Telehealth: Payer: Self-pay | Admitting: Internal Medicine

## 2019-09-14 NOTE — Telephone Encounter (Signed)
nurse unable to lm, mailbox full. " [facility] [place] said 8 days ago  "Rejection Reason - Other - nurse unable to reach pt. vm full. " [facility] [place] said 1 day ago

## 2019-09-14 NOTE — Telephone Encounter (Signed)
So they need to send her a letter !~!

## 2019-09-20 DIAGNOSIS — M3501 Sicca syndrome with keratoconjunctivitis: Secondary | ICD-10-CM | POA: Diagnosis not present

## 2019-10-18 ENCOUNTER — Telehealth: Payer: Self-pay | Admitting: Internal Medicine

## 2019-10-18 NOTE — Telephone Encounter (Signed)
Sheril Kranz KeySolmon Ice - [place] Case ID: [id number]

## 2019-11-05 NOTE — Telephone Encounter (Signed)
Pt called to be scheduled for Prolia. I put her on schedule for 11/14/19 @ 3:30. If this will not work, please let me know.

## 2019-11-14 ENCOUNTER — Other Ambulatory Visit: Payer: Self-pay

## 2019-11-14 ENCOUNTER — Ambulatory Visit (INDEPENDENT_AMBULATORY_CARE_PROVIDER_SITE_OTHER): Payer: Medicare PPO

## 2019-11-14 DIAGNOSIS — M810 Age-related osteoporosis without current pathological fracture: Secondary | ICD-10-CM

## 2019-11-14 MED ORDER — DENOSUMAB 60 MG/ML ~~LOC~~ SOSY
60.0000 mg | PREFILLED_SYRINGE | Freq: Once | SUBCUTANEOUS | Status: AC
Start: 2019-11-14 — End: 2019-11-14
  Administered 2019-11-14: 60 mg via SUBCUTANEOUS

## 2019-11-14 NOTE — Progress Notes (Signed)
Patient presented for 6-month Prolia injection SQ to left arm. Patient tolerated well. 

## 2020-04-07 ENCOUNTER — Telehealth: Payer: Self-pay | Admitting: Internal Medicine

## 2020-04-07 NOTE — Telephone Encounter (Signed)
Noya List Key: [id number] - [place] Case ID: [id number] started on Cover my meds.Tried to reach patient to schedule Prolia injection for on or after 05/06/20, please schedule.

## 2020-05-06 ENCOUNTER — Ambulatory Visit (INDEPENDENT_AMBULATORY_CARE_PROVIDER_SITE_OTHER): Payer: Medicare PPO

## 2020-05-06 ENCOUNTER — Other Ambulatory Visit: Payer: Self-pay

## 2020-05-06 DIAGNOSIS — M810 Age-related osteoporosis without current pathological fracture: Secondary | ICD-10-CM

## 2020-05-06 MED ORDER — DENOSUMAB 60 MG/ML ~~LOC~~ SOSY
60.0000 mg | PREFILLED_SYRINGE | Freq: Once | SUBCUTANEOUS | Status: AC
  Administered 2020-05-06: 60 mg via SUBCUTANEOUS

## 2020-05-06 NOTE — Progress Notes (Signed)
Patient presented for 6-month Prolia injection SQ to left arm. Patient tolerated well. 

## 2020-06-13 ENCOUNTER — Ambulatory Visit (INDEPENDENT_AMBULATORY_CARE_PROVIDER_SITE_OTHER): Payer: Medicare PPO

## 2020-06-13 ENCOUNTER — Other Ambulatory Visit: Payer: Self-pay

## 2020-06-13 VITALS — BP 131/84 | HR 68 | Temp 97.2°F | Resp 14 | Ht <= 58 in | Wt 107.0 lb

## 2020-06-13 DIAGNOSIS — M810 Age-related osteoporosis without current pathological fracture: Secondary | ICD-10-CM

## 2020-06-13 DIAGNOSIS — Z1231 Encounter for screening mammogram for malignant neoplasm of breast: Secondary | ICD-10-CM

## 2020-06-13 DIAGNOSIS — Z0000 Encounter for general adult medical examination without abnormal findings: Secondary | ICD-10-CM

## 2020-06-13 NOTE — Patient Instructions (Addendum)
Natalie Coleman , Thank you for taking time to come for your Medicare Wellness Visit. I appreciate your ongoing commitment to your health goals. Please review the following plan we discussed and let me know if I can assist you in the future.   These are the goals we discussed: Goals      Patient Stated   .   Complete Advanced Directive  (pt-stated)      Living Will/HCPOA; plans to discuss with daughter    .  I would like walk more for exercise (pt-stated)      Stretch and walk for exercise more as tolerated       This is a list of the screening recommended for you and due dates:  Health Maintenance  Topic Date Due  . Mammogram  03/02/2019  . Tetanus Vaccine  06/13/2021*  . DEXA scan (bone density measurement)  Completed  . Pneumonia vaccines  Completed  . HPV Vaccine  Aged Out  . Flu Shot  Discontinued  . COVID-19 Vaccine  Discontinued  *Topic was postponed. The date shown is not the original due date.     Advanced directives: not yet completed.  Follow up in one year for your annual wellness visit.   Preventive Care 82 Years and Older, Female Preventive care refers to lifestyle choices and visits with your health care provider that can promote health and wellness. What does preventive care include?  A yearly physical exam. This is also called an annual well check.  Dental exams once or twice a year.  Routine eye exams. Ask your health care provider how often you should have your eyes checked.  Personal lifestyle choices, including:  Daily care of your teeth and gums.  Regular physical activity.  Eating a healthy diet.  Avoiding tobacco and drug use.  Limiting alcohol use.  Practicing safe sex.  Taking low-dose aspirin every day.  Taking vitamin and mineral supplements as recommended by your health care provider. What happens during an annual well check? The services and screenings done by your health care provider during your annual well check will depend  on your age, overall health, lifestyle risk factors, and family history of disease. Counseling  Your health care provider may ask you questions about your:  Alcohol use.  Tobacco use.  Drug use.  Emotional well-being.  Home and relationship well-being.  Sexual activity.  Eating habits.  History of falls.  Memory and ability to understand (cognition).  Work and work Astronomer.  Reproductive health. Screening  You may have the following tests or measurements:  Height, weight, and BMI.  Blood pressure.  Lipid and cholesterol levels. These may be checked every 5 years, or more frequently if you are over 16 years old.  Skin check.  Lung cancer screening. You may have this screening every year starting at age 48 if you have a 30-pack-year history of smoking and currently smoke or have quit within the past 15 years.  Fecal occult blood test (FOBT) of the stool. You may have this test every year starting at age 15.  Flexible sigmoidoscopy or colonoscopy. You may have a sigmoidoscopy every 5 years or a colonoscopy every 10 years starting at age 43.  Hepatitis C blood test.  Hepatitis B blood test.  Sexually transmitted disease (STD) testing.  Diabetes screening. This is done by checking your blood sugar (glucose) after you have not eaten for a while (fasting). You may have this done every 1-3 years.  Bone density scan. This is  done to screen for osteoporosis. You may have this done starting at age 65.  Mammogram. This may be done every 1-2 years. Talk to your health care provider about how often you should have regular mammograms. Talk with your health care provider about your test results, treatment options, and if necessary, the need for more tests. Vaccines  Your health care provider may recommend certain vaccines, such as:  Influenza vaccine. This is recommended every year.  Tetanus, diphtheria, and acellular pertussis (Tdap, Td) vaccine. You may need a Td  booster every 10 years.  Zoster vaccine. You may need this after age 31.  Pneumococcal 13-valent conjugate (PCV13) vaccine. One dose is recommended after age 21.  Pneumococcal polysaccharide (PPSV23) vaccine. One dose is recommended after age 34. Talk to your health care provider about which screenings and vaccines you need and how often you need them. This information is not intended to replace advice given to you by your health care provider. Make sure you discuss any questions you have with your health care provider. Document Released: 02/28/2015 Document Revised: 10/22/2015 Document Reviewed: 12/03/2014 Elsevier Interactive Patient Education  2017 [employer].  Fall Prevention in the Home Falls can cause injuries. They can happen to people of all ages. There are many things you can do to make your home safe and to help prevent falls. What can I do on the outside of my home?  Regularly fix the edges of walkways and driveways and fix any cracks.  Remove anything that might make you trip as you walk through a door, such as a raised step or threshold.  Trim any bushes or trees on the path to your home.  Use bright outdoor lighting.  Clear any walking paths of anything that might make someone trip, such as rocks or tools.  Regularly check to see if handrails are loose or broken. Make sure that both sides of any steps have handrails.  Any raised decks and porches should have guardrails on the edges.  Have any leaves, snow, or ice cleared regularly.  Use sand or salt on walking paths during winter.  Clean up any spills in your garage right away. This includes oil or grease spills. What can I do in the bathroom?  Use night lights.  Install grab bars by the toilet and in the tub and shower. Do not use towel bars as grab bars.  Use non-skid mats or decals in the tub or shower.  If you need to sit down in the shower, use a plastic, non-slip stool.  Keep the floor dry. Clean up  any water that spills on the floor as soon as it happens.  Remove soap buildup in the tub or shower regularly.  Attach bath mats securely with double-sided non-slip rug tape.  Do not have throw rugs and other things on the floor that can make you trip. What can I do in the bedroom?  Use night lights.  Make sure that you have a light by your bed that is easy to reach.  Do not use any sheets or blankets that are too big for your bed. They should not hang down onto the floor.  Have a firm chair that has side arms. You can use this for support while you get dressed.  Do not have throw rugs and other things on the floor that can make you trip. What can I do in the kitchen?  Clean up any spills right away.  Avoid walking on wet floors.  Keep  items that you use a lot in easy-to-reach places.  If you need to reach something above you, use a strong step stool that has a grab bar.  Keep electrical cords out of the way.  Do not use floor polish or wax that makes floors slippery. If you must use wax, use non-skid floor wax.  Do not have throw rugs and other things on the floor that can make you trip. What can I do with my stairs?  Do not leave any items on the stairs.  Make sure that there are handrails on both sides of the stairs and use them. Fix handrails that are broken or loose. Make sure that handrails are as long as the stairways.  Check any carpeting to make sure that it is firmly attached to the stairs. Fix any carpet that is loose or worn.  Avoid having throw rugs at the top or bottom of the stairs. If you do have throw rugs, attach them to the floor with carpet tape.  Make sure that you have a light switch at the top of the stairs and the bottom of the stairs. If you do not have them, ask someone to add them for you. What else can I do to help prevent falls?  Wear shoes that:  Do not have high heels.  Have rubber bottoms.  Are comfortable and fit you well.  Are  closed at the toe. Do not wear sandals.  If you use a stepladder:  Make sure that it is fully opened. Do not climb a closed stepladder.  Make sure that both sides of the stepladder are locked into place.  Ask someone to hold it for you, if possible.  Clearly mark and make sure that you can see:  Any grab bars or handrails.  First and last steps.  Where the edge of each step is.  Use tools that help you move around (mobility aids) if they are needed. These include:  Canes.  Walkers.  Scooters.  Crutches.  Turn on the lights when you go into a dark area. Replace any light bulbs as soon as they burn out.  Set up your furniture so you have a clear path. Avoid moving your furniture around.  If any of your floors are uneven, fix them.  If there are any pets around you, be aware of where they are.  Review your medicines with your doctor. Some medicines can make you feel dizzy. This can increase your chance of falling. Ask your doctor what other things that you can do to help prevent falls. This information is not intended to replace advice given to you by your health care provider. Make sure you discuss any questions you have with your health care provider. Document Released: 11/28/2008 Document Revised: 07/10/2015 Document Reviewed: 03/08/2014 Elsevier Interactive Patient Education  2017 [employer]  Bone Density Test A bone density test uses a type of X-ray to measure the amount of calcium and other minerals in a person's bones. It can measure bone density in the hip and the spine. The test is similar to having a regular X-ray. This test may also be called:  Bone densitometry.  Bone mineral density test.  Dual-energy X-ray absorptiometry (DEXA). You may have this test to:  Diagnose a condition that causes weak or thin bones (osteoporosis).  Screen you for osteoporosis.  Predict your risk for a broken bone (fracture).  Determine how well your osteoporosis  treatment is working. Tell a health care provider about:  Any allergies you have.  All medicines you are taking, including vitamins, herbs, eye drops, creams, and over-the-counter medicines.  Any problems you or family members have had with anesthetic medicines.  Any blood disorders you have.  Any surgeries you have had.  Any medical conditions you have.  Whether you are pregnant or may be pregnant.  Any medical tests you have had within the past 14 days that used contrast material. What are the risks? Generally, this is a safe test. However, it does expose you to a small amount of radiation, which can slightly increase your cancer risk. What happens before the test?  Do not take any calcium supplements within the 24 hours before your test.  You will need to remove all metal jewelry, eyeglasses, removable dental appliances, and any other metal objects on your body. What happens during the test?  You will lie down on an exam table. There will be an X-ray generator below you and an imaging device above you.  Other devices, such as boxes or braces, may be used to position your body properly for the scan.  The machine will slowly scan your body. You will need to keep very still while the machine does the scan.  The images will show up on a screen in the room. Images will be examined by a specialist after your test is finished. The procedure may vary among health care providers and hospitals.   What can I expect after the test? It is up to you to get the results of your test. Ask your health care provider, or the department that is doing the test, when your results will be ready. Summary  A bone density test is an imaging test that uses a type of X-ray to measure the amount of calcium and other minerals in your bones.  The test may be used to diagnose or screen you for a condition that causes weak or thin bones (osteoporosis), predict your risk for a broken bone (fracture), or  determine how well your osteoporosis treatment is working.  Do not take any calcium supplements within 24 hours before your test.  Ask your health care provider, or the department that is doing the test, when your results will be ready. This information is not intended to replace advice given to you by your health care provider. Make sure you discuss any questions you have with your health care provider. Document Revised: 07/19/2019 Document Reviewed: 07/19/2019 Elsevier Patient Education  2021 Else[employer] Inc.   Mammogram A mammogram is a low energy X-ray of the breasts that is done to check for abnormal changes. This procedure can screen for and detect any changes that may indicate breast cancer. Mammograms are regularly done on women. A man may have a mammogram if he has a lump or swelling in his breast. A mammogram can also identify other changes and variations in the breast, such as:  Inflammation of the breast tissue (mastitis).  An infected area that contains a collection of pus (abscess).  A fluid-filled sac (cyst).  Fibrocystic changes. This is when breast tissue becomes denser, which can make the tissue feel rope-like or uneven under the skin.  Tumors that are not cancerous (benign). Tell a health care provider:  About any allergies you have.  If you have breast implants.  If you have had previous breast disease, biopsy, or surgery.  If you are breastfeeding.  If you are younger than age 38.  If you have a family history of breast cancer.  Whether you are pregnant or may be pregnant. What are the risks? Generally, this is a safe procedure. However, problems may occur, including:  Exposure to radiation. Radiation levels are very low with this test.  The results being misinterpreted.  The need for further tests.  The inability of the mammogram to detect certain cancers. What happens before the procedure?  Schedule your test about 1-2 weeks after your menstrual  period if you are still menstruating. This is usually when your breasts are the least tender.  If you have had a mammogram done at a different facility in the past, get the mammogram X-rays or have them sent to your current exam facility. The new and old images will be compared.  Wash your breasts and underarms on the day of the test.  Do not wear deodorants, perfumes, lotions, or powders anywhere on your body on the day of the test.  Remove any jewelry from your neck.  Wear clothes that you can change into and out of easily. What happens during the procedure?  You will undress from the waist up and put on a gown that opens in the front.  You will stand in front of the X-ray machine.  Each breast will be placed between two plastic or glass plates. The plates will compress your breast for a few seconds. Try to stay as relaxed as possible during the procedure. This does not cause any harm to your breasts and any discomfort you feel will be very brief.  X-rays will be taken from different angles of each breast. The procedure may vary among health care providers and hospitals.   What happens after the procedure?  The mammogram will be examined by a specialist (radiologist).  You may need to repeat certain parts of the test, depending on the quality of the images. This is commonly done if the radiologist needs a better view of the breast tissue.  You may resume your normal activities.  It is up to you to get the results of your procedure. Ask your health care provider, or the department that is doing the procedure, when your results will be ready. Summary  A mammogram is a low energy X-ray of the breasts that is done to check for abnormal changes. A man may have a mammogram if he has a lump or swelling in his breast.  If you have had a mammogram done at a different facility in the past, get the mammogram X-rays or have them sent to your current exam facility in order to compare  them.  Schedule your test about 1-2 weeks after your menstrual period if you are still menstruating.  For this test, each breast will be placed between two plastic or glass plates. The plates will compress your breast for a few seconds.  Ask when your test results will be ready. Make sure you get your test results. This information is not intended to replace advice given to you by your health care provider. Make sure you discuss any questions you have with your health care provider. Document Revised: 09/22/2017 Document Reviewed: 09/22/2017 Elsevier Patient Education  2021 [employer]

## 2020-06-13 NOTE — Progress Notes (Addendum)
Subjective:   Natalie Coleman is a 80 y.o. female who presents for Medicare Annual (Subsequent) preventive examination.  Review of Systems    No ROS.  Medicare Wellness    Cardiac Risk Factors include: advanced age (>14men, >26 women);hypertension     Objective:    Today's Vitals   06/13/20 1041  BP: 131/84  Pulse: 68  Resp: 14  Temp: (!) 97.2 F (36.2 C)  SpO2: 99%  Weight: 107 lb (48.5 kg)  Height: 4\' 6"  (1.372 m)   Body mass index is 25.8 kg/m.  Advanced Directives 06/13/2020 06/13/2019 06/05/2018 05/03/2017 02/02/2016 12/30/2014  Does Patient Have a Medical Advance Directive? No No No No No No  Would patient like information on creating a medical advance directive? No - Patient declined No - Patient declined No - Patient declined No - Patient declined No - Patient declined Yes - Educational materials given    Current Medications (verified) Outpatient Encounter Medications as of 06/13/2020  Medication Sig   ammonium lactate (AMLACTIN) 12 % lotion Apply 1 application topically as needed for dry skin.   aspirin 81 MG tablet Take 81 mg by mouth daily.   Calcium Citrate 250 MG TABS Take 2 tablets by mouth 3 (three) times daily.   Coenzyme Q10 100 MG TABS Take 1 tablet by mouth daily.   denosumab (PROLIA) 60 MG/ML SOLN injection Inject 60 mg into the skin every 6 (six) months. Administer in upper arm, thigh, or abdomen   GARLIC PO Take 1 capsule by mouth daily.   Lactobacillus-Inulin (CULTURELLE DIGESTIVE HEALTH) CAPS Take 1 capsule by mouth daily.   Melatonin 5 MG TABS Take 1 tablet by mouth as needed.   Multiple Vitamins-Minerals (CENTRUM SILVER PO) Take 1 tablet by mouth daily.   VALERIAN ROOT PO Take 1 capsule by mouth as needed.   vitamin B-12 (CYANOCOBALAMIN) 1000 MCG tablet Take 5,000 mcg by mouth daily.    vitamin C (ASCORBIC ACID) 500 MG tablet Take 500 mg by mouth daily.   No facility-administered encounter medications on file as of 06/13/2020.    Allergies  (verified) Dexamethasone   History: Past Medical History:  Diagnosis Date   Colon polyps    GERD (gastroesophageal reflux disease)    Hemorrhoids    History of blood transfusion    Osteoporosis    Past Surgical History:  Procedure Laterality Date   CESAREAN SECTION  06/15/2020   CHOLECYSTECTOMY  2008   COLONOSCOPY  2009   COLONOSCOPY WITH PROPOFOL N/A 12/10/2015   Procedure: COLONOSCOPY WITH PROPOFOL;  Surgeon: 12/12/2015, MD;  Location: [facility] ENDOSCOPY;  Service: Endoscopy;  Laterality: N/A;   EYE SURGERY Right    FRACTURE SURGERY     right tibia   ORIF ANKLE FRACTURE BIMALLEOLAR  March 2006   ORIF TIBIA & FIBULA FRACTURES  March 2006   Family History  Problem Relation Age of Onset   Hypertension Father    Hypertension Mother    Hypertension Sister    Breast cancer Cousin    Cancer Neg Hx    Social History   Socioeconomic History   Marital status: Divorced    Spouse name: Not on file   Number of children: Not on file   Years of education: Not on file   Highest education level: Not on file  Occupational History   Not on file  Tobacco Use   Smoking status: Never Smoker   Smokeless tobacco: Never Used  Substance and Sexual Activity  Alcohol use: Yes    Alcohol/week: 1.0 standard drink    Types: 1 Glasses of wine per week    Comment: ocassional   Drug use: No   Sexual activity: Never  Other Topics Concern   Not on file  Social History Narrative   Not on file   Social Determinants of Health   Financial Resource Strain: Low Risk    Difficulty of Paying Living Expenses: Not hard at all  Food Insecurity: No Food Insecurity   Worried About Programme researcher, broadcasting/film/video in the Last Year: Never true   Ran Out of Food in the Last Year: Never true  Transportation Needs: No Transportation Needs   Lack of Transportation (Medical): No   Lack of Transportation (Non-Medical): No  Physical Activity: Not on file  Stress: No Stress Concern Present   Feeling of Stress :  Not at all  Social Connections: Unknown   Frequency of Communication with Friends and Family: More than three times a week   Frequency of Social Gatherings with Friends and Family: Not on file   Attends Religious Services: Not on file   Active Member of Clubs or Organizations: Not on file   Attends Banker Meetings: Not on file   Marital Status: Not on file    Tobacco Counseling Counseling given: Not Answered   Clinical Intake:  Pre-visit preparation completed: Yes                       Activities of Daily Living In your present state of health, do you have any difficulty performing the following activities: 06/13/2020  Hearing? N  Vision? N  Difficulty concentrating or making decisions? Y  Comment Notes she has difficulty with some short term memory  Walking or climbing stairs? N  Dressing or bathing? N  Doing errands, shopping? N  Preparing Food and eating ? N  Using the Toilet? N  In the past six months, have you accidently leaked urine? N  Do you have problems with loss of bowel control? N  Managing your Medications? N  Managing your Finances? N  Housekeeping or managing your Housekeeping? N  Some recent data might be hidden    Patient Care Team: Sherlene Shams, MD as PCP - General (Internal Medicine) Lemar Livings, Merrily Pew, MD (General Surgery) Sherlene Shams, MD (Internal Medicine)  Indicate any recent Medical Services you may have received from other than [facility] providers in the past year (date may be approximate).     Assessment:   This is a routine wellness examination for Natalie Coleman.  Hearing/Vision screen  Hearing Screening   125Hz  250Hz  500Hz  1000Hz  2000Hz  3000Hz  4000Hz  6000Hz  8000Hz   Right ear:           Left ear:           Comments: Patient is able to hear conversational tones without difficulty.  No issues reported.   Dietary issues and exercise activities discussed: Current Exercise Habits: The patient does not participate  in regular exercise at present  Healthy diet Good water intake  Goals       Patient Stated      Complete Advanced Directive  (pt-stated)      Living Will/HCPOA; plans to discuss with daughter      I would like walk more for exercise (pt-stated)      Stretch and walk for exercise more as tolerated       Depression Screen [facility] 2/9 Scores 06/13/2020 09/03/2019  06/13/2019 06/05/2018 07/17/2017 05/03/2017 02/02/2016  PHQ - 2 Score 0 0 1 0 - 2 0  PHQ- 9 Score - - - - - 9 -  Exception Documentation - - - - (No Data) - -    Fall Risk Fall Risk  06/13/2020 09/03/2019 06/28/2019 06/13/2019 10/27/2018  Falls in the past year? 0 0 0 0 0  Number falls in past yr: 0 0 - - -  Injury with Fall? 0 - - - -  Risk for fall due to : - - - Impaired balance/gait Impaired balance/gait;Impaired mobility  Follow up Falls evaluation completed Falls evaluation completed Falls evaluation completed Falls evaluation completed Falls evaluation completed    FALL RISK PREVENTION PERTAINING TO THE HOME: Handrails in use when climbing stairs? Yes Home free of loose throw rugs in walkways, pet beds, electrical cords, etc? Yes  Adequate lighting in your home to reduce risk of falls? Yes   ASSISTIVE DEVICES UTILIZED TO PREVENT FALLS: Life alert? No  Use of a cane, walker or w/c? No   TIMED UP AND GO: Was the test performed? Yes .  Length of time to ambulate 10 feet: 18 sec.   Gait slow and steady without use of assistive device  Cognitive Function: MMSE - Mini Mental State Exam 12/30/2014  Orientation to time 5  Orientation to Place 5  Registration 3  Attention/ Calculation 5  Recall 3  Language- name 2 objects 2  Language- repeat 1  Language- follow 3 step command 3  Language- read & follow direction 1  Write a sentence 1  Copy design 1  Total score 30     6CIT Screen 06/13/2020 06/13/2019 06/05/2018 05/03/2017  What Year? 0 points 0 points 0 points 0 points  What month? 0 points 0 points 0 points 0 points   What time? 0 points 0 points 0 points 0 points  Count back from 20 0 points - 0 points 0 points  Months in reverse 0 points - 0 points 0 points  Repeat phrase 10 points - 0 points 0 points  Total Score 10 - 0 0    Immunizations Immunization History  Administered Date(s) Administered   Pneumococcal Conjugate-13 08/15/2012   Pneumococcal Polysaccharide-23 08/17/2004, 01/13/2015   Td 08/15/2004   Zoster 01/18/2014    TDAP status: Due, Education has been provided regarding the importance of this vaccine. Advised may receive this vaccine at local pharmacy or Health Dept. Aware to provide a copy of the vaccination record if obtained from local pharmacy or Health Dept. Verbalized acceptance and understanding. Deferred.   Health Maintenance Health Maintenance  Topic Date Due   MAMMOGRAM  03/02/2019   TETANUS/TDAP  06/13/2021 (Originally 08/16/2014)   DEXA SCAN  Completed   PNA vac Low Risk Adult  Completed   HPV VACCINES  Aged Out   INFLUENZA VACCINE  Discontinued   COVID-19 Vaccine  Discontinued   Colorectal cancer screening: No longer required.    Mammogram- due  Bone Density status: Completed 03/01/18. Results reflect: Bone density results: OSTEOPOROSIS. Repeat every 2 years. Prolia injection.   Lung Cancer Screening: (Low Dose CT Chest recommended if Age 48-80 years, 30 pack-year currently smoking OR have quit w/in 15years.) does not qualify.    Hepatitis C Screening: does not qualify.  Vision Screening: Recommended annual ophthalmology exams for early detection of glaucoma and other disorders of the eye. Is the patient up to date with their annual eye exam?  Yes   Dental Screening: Recommended annual  dental exams for proper oral hygiene.  Community Resource Referral / Chronic Care Management: CRR required this visit?  No   CCM required this visit?  No      Plan:    I have personally reviewed and noted the following in the patient's chart:   Medical and social  history Use of alcohol, tobacco or illicit drugs  Current medications and supplements Functional ability and status Nutritional status Physical activity Advanced directives List of other physicians Hospitalizations, surgeries, and ER visits in previous 12 months Vitals Screenings to include cognitive, depression, and falls Referrals and appointments  In addition, I have reviewed and discussed with patient certain preventive protocols, quality metrics, and best practice recommendations. A written personalized care plan for preventive services as well as general preventive health recommendations were provided to patient.     OBrien-Blaney, Nai Dasch L, LPN   1/61/09604/29/2022    I have reviewed the above information and agree with above.   Duncan Dulleresa Tullo, MD

## 2020-06-24 ENCOUNTER — Telehealth: Payer: Self-pay

## 2020-06-24 ENCOUNTER — Emergency Department
Admission: EM | Admit: 2020-06-24 | Discharge: 2020-06-24 | Disposition: A | Discharge status: Home / Self Care | Payer: Medicare PPO | Attending: Emergency Medicine | Admitting: Emergency Medicine

## 2020-06-24 ENCOUNTER — Other Ambulatory Visit: Payer: Self-pay

## 2020-06-24 DIAGNOSIS — Z7982 Long term (current) use of aspirin: Secondary | ICD-10-CM | POA: Insufficient documentation

## 2020-06-24 DIAGNOSIS — K625 Hemorrhage of anus and rectum: Secondary | ICD-10-CM | POA: Insufficient documentation

## 2020-06-24 DIAGNOSIS — K649 Unspecified hemorrhoids: Secondary | ICD-10-CM | POA: Diagnosis not present

## 2020-06-24 LAB — COMPREHENSIVE METABOLIC PANEL
ALT: 15 U/L (ref 0–44)
AST: 21 U/L (ref 15–41)
Albumin: 4.3 g/dL (ref 3.5–5.0)
Alkaline Phosphatase: 46 U/L (ref 38–126)
Anion gap: 10 (ref 5–15)
BUN: 19 mg/dL (ref 8–23)
CO2: 25 mmol/L (ref 22–32)
Calcium: 9.7 mg/dL (ref 8.9–10.3)
Chloride: 104 mmol/L (ref 98–111)
Creatinine, Ser: 0.66 mg/dL (ref 0.44–1.00)
GFR, Estimated: >60 mL/min (ref >60)
Glucose, Bld: 111 mg/dL — ABNORMAL HIGH (ref 70–99)
Potassium: 3.5 mmol/L (ref 3.5–5.1)
Sodium: 139 mmol/L (ref 135–145)
Total Bilirubin: 0.9 mg/dL (ref 0.3–1.2)
Total Protein: 7.4 g/dL (ref 6.5–8.1)

## 2020-06-24 LAB — CBC
HCT: 41.8 % (ref 36.0–46.0)
Hemoglobin: 14.2 g/dL (ref 12.0–15.0)
MCH: 30.6 pg (ref 26.0–34.0)
MCHC: 34 g/dL (ref 30.0–36.0)
MCV: 90.1 fL (ref 80.0–100.0)
Platelets: 264 10*3/uL (ref 150–400)
RBC: 4.64 MIL/uL (ref 3.87–5.11)
RDW: 12.5 % (ref 11.5–15.5)
WBC: 16.1 10*3/uL — ABNORMAL HIGH (ref 4.0–10.5)
nRBC: 0 % (ref 0.0–0.2)

## 2020-06-24 LAB — TYPE AND SCREEN
ABO/RH(D): O NEG
Antibody Screen: NEGATIVE

## 2020-06-24 NOTE — ED Provider Notes (Signed)
[facility] Emergency Department Provider Note   ____________________________________________    I have reviewed the triage vital signs and the nursing notes.   HISTORY  Chief Complaint Diarrhea and GI Bleeding     HPI Natalie Coleman is a 80 y.o. female who is not on blood thinners and does have a distant history of hemorrhoids who presents with complaints of rectal bleeding.  Patient reports that she had indigestion over the weekend, had numerous bowel movements, but the last bowel movement she noticed bright red blood on the toilet paper.  She denies abdominal pain at this time.  No further bleeding.  Has no complaints at this time.  She does take a baby aspirin daily  Past Medical History:  Diagnosis Date  . Colon polyps   . GERD (gastroesophageal reflux disease)   . Hemorrhoids   . History of blood transfusion   . Osteoporosis     Patient Active Problem List   Diagnosis Date Noted  . Conjunctivitis 09/03/2019  . Bilateral leg edema 07/30/2018  . Educated about COVID-19 virus infection 07/30/2018  . Nocturia more than twice per night 07/17/2017  . Chronic lumbar radiculopathy 09/29/2016  . Chronic left hip pain 09/28/2016  . Anxiety state 10/01/2015  . Encounter for screening colonoscopy 08/15/2013  . Bleeding hemorrhoid 02/21/2013  . Encounter for preventive health examination 08/18/2012  . History of pelvic fracture 09/15/2011  . Hyperlipidemia with target LDL less than 130 09/15/2011  . Osteoporosis, postmenopausal 09/15/2011  . History of colonic polyps 09/04/2011  . Screening for breast cancer 09/04/2011  . GERD (gastroesophageal reflux disease)     Past Surgical History:  Procedure Laterality Date  . CESAREAN SECTION  [id number]  . CHOLECYSTECTOMY  2008  . COLONOSCOPY  2009  . COLONOSCOPY WITH PROPOFOL N/A 12/10/2015   Procedure: COLONOSCOPY WITH PROPOFOL;  Surgeon: Earline Mayotte, MD;  Location: [facility] ENDOSCOPY;   Service: Endoscopy;  Laterality: N/A;  . EYE SURGERY Right   . FRACTURE SURGERY     right tibia  . ORIF ANKLE FRACTURE BIMALLEOLAR  March 2006  . ORIF TIBIA & FIBULA FRACTURES  March 2006    Prior to Admission medications   Medication Sig Start Date End Date Taking? Authorizing Provider  ammonium lactate (AMLACTIN) 12 % lotion Apply 1 application topically as needed for dry skin. 09/22/17   Sherlene Shams, MD  aspirin 81 MG tablet Take 81 mg by mouth daily.    [provider]  Calcium Citrate 250 MG TABS Take 2 tablets by mouth 3 (three) times daily.    [provider]  Coenzyme Q10 100 MG TABS Take 1 tablet by mouth daily.    [provider]  denosumab (PROLIA) 60 MG/ML SOLN injection Inject 60 mg into the skin every 6 (six) months. Administer in upper arm, thigh, or abdomen    [provider]  GARLIC PO Take 1 capsule by mouth daily.    [provider]  Lactobacillus-Inulin (CULTURELLE DIGESTIVE HEALTH) CAPS Take 1 capsule by mouth daily. 06/14/13   Sherlene Shams, MD  Melatonin 5 MG TABS Take 1 tablet by mouth as needed.    [provider]  Multiple Vitamins-Minerals (CENTRUM SILVER PO) Take 1 tablet by mouth daily.    [provider]  VALERIAN ROOT PO Take 1 capsule by mouth as needed.    [provider]  vitamin B-12 (CYANOCOBALAMIN) 1000 MCG tablet Take 5,000 mcg by mouth daily.  [provider]  vitamin C (ASCORBIC ACID) 500 MG tablet Take 500 mg by mouth daily.    [provider]     Allergies Dexamethasone  Family History  Problem Relation Age of Onset  . Hypertension Father   . Hypertension Mother   . Hypertension Sister   . Breast cancer Cousin   . Cancer Neg Hx     Social History Social History   Tobacco Use  . Smoking status: Never Smoker  . Smokeless tobacco: Never Used  Substance Use Topics  . Alcohol use: Yes    Alcohol/week: 1.0 standard drink    Types: 1 Glasses  of wine per week    Comment: ocassional  . Drug use: No    Review of Systems  Constitutional: As above  Cardiovascular: Denies chest pain. Respiratory: Denies shortness of breath. Gastrointestinal: As above  Skin: Negative for rash. Neurological: Negative for headaches or weakness   ____________________________________________   PHYSICAL EXAM:  VITAL SIGNS: ED Triage Vitals  Enc Vitals Group     BP 06/24/20 1541 (!) 135/95     Pulse Rate 06/24/20 1541 81     Resp 06/24/20 1541 16     Temp --      Temp src --      SpO2 06/24/20 1541 100 %     Weight 06/24/20 1539 48 kg (105 lb 13.1 oz)     Height 06/24/20 1539 1.372 m (4\' 6" )     Head Circumference --      Peak Flow --      Pain Score 06/24/20 1539 3     Pain Loc --      Pain Edu? --      Excl. in GC? --     Constitutional: Alert and oriented. No acute distress. Pleasant and interactive  Nose: No congestion/rhinnorhea. Mouth/Throat: Mucous membranes are moist.    Cardiovascular: Normal rate, regular rhythm.  Good peripheral circulation. Respiratory: Normal respiratory effort.  No retractions.  Gastrointestinal: Soft and nontender. No distention.  On exam patient with 2 hemorrhoids at 10 and 11:00, no current bleeding, stool is guaiac negative.  Genitourinary: deferred Musculoskeletal:  Warm and well perfused  Skin:  Skin is warm, dry and intact. No rash noted. Psychiatric: Mood and affect are normal. Speech and behavior are normal.  ____________________________________________   LABS (all labs ordered are listed, but only abnormal results are displayed)  Labs Reviewed  CBC - Abnormal; Notable for the following components:      Result Value   WBC 16.1 (*)    All other components within normal limits  COMPREHENSIVE METABOLIC PANEL - Abnormal; Notable for the following components:   Glucose, Bld 111 (*)    All other components within normal limits  TYPE AND SCREEN    ____________________________________________  EKG   ____________________________________________  RADIOLOGY   ____________________________________________   PROCEDURES  Procedure(s) performed: No  Procedures   Critical Care performed: No ____________________________________________   INITIAL IMPRESSION / ASSESSMENT AND PLAN / ED COURSE  Pertinent labs & imaging results that were available during my care of the patient were reviewed by me and considered in my medical decision making (see chart for details).  Patient well-appearing and in no acute distress, vital signs reassuring, on exam patient with hemorrhoids, stool guaiac negative.  Hemoglobin is normal.  White blood cell count mildly elevated, this may be related to what sounds like a gastroenteritis that she has had recently.  She does not have any abdominal  tenderness to palpation fevers or chills.  Appropriate for discharge at this time, strict return precautions discussed, patient agrees with    ____________________________________________   FINAL CLINICAL IMPRESSION(S) / ED DIAGNOSES  Final diagnoses:  Hemorrhoids, unspecified hemorrhoid type        Note:  This document was prepared using Dragon voice recognition software and may include unintentional dictation errors.   Jene Every, MD 06/24/20 [phone]

## 2020-06-24 NOTE — Telephone Encounter (Signed)
Patient started with stomach pain yesterday and she has rectal pain, last night she had some bleeding thought could be a hemorrhoid but was just very little, today she has had more diarrhea 4 to 6 episodes with increased bleeding which she says is red , red in color and she was on phone with me "she said there was a lot of red red blood nothing but blood per patient." Advised patient she needs to be evaluated that with bright red blood we need to know how much and where its coming from, she started to cry stated she does not want to go to ER, advised she had anyone to drive her she stated her daughter was at work, and she could not think of the number , she stated  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 I had CMA call while I remained on phone with patient whom was crying, patient stated her stomach was hurting again remained on phone while she went to the restroom she advised there was more red red blood, I advised her daughter was on the way and assured I would stay on phone, on phone about 5  Minutes stomach started to hurt she said and she went back to restroom and there was more blood red red in color per patient, pain better after going to restroom. Daughter arrived and was taking patient to ER.

## 2020-06-24 NOTE — ED Triage Notes (Signed)
C/o diarrhea that started yesterday, GI bright red bleeding today. Hx of hemorrhoids. Mild lower abdominal pain. Takes baby ASA.

## 2020-06-24 NOTE — Telephone Encounter (Signed)
Pt called and states that her stomach was hurting really bad and then went to the bathroom and there was a lot of blood and she was very scared. She states that it was bright red in color. I offered her an appt for this afternoon and she refused. She wanted to speak with someone ASAP. Transferred to [facility] at [employer] nurse to triage

## 2020-08-08 ENCOUNTER — Ambulatory Visit: Payer: Medicare PPO | Admitting: Internal Medicine

## 2020-08-12 ENCOUNTER — Encounter: Payer: Self-pay | Admitting: Internal Medicine

## 2020-08-15 ENCOUNTER — Other Ambulatory Visit: Payer: Self-pay | Admitting: Internal Medicine

## 2020-08-15 ENCOUNTER — Ambulatory Visit: Payer: Medicare PPO | Admitting: Internal Medicine

## 2020-08-15 ENCOUNTER — Telehealth: Payer: Self-pay

## 2020-08-15 ENCOUNTER — Encounter: Payer: Self-pay | Admitting: Internal Medicine

## 2020-08-15 ENCOUNTER — Other Ambulatory Visit: Payer: Self-pay

## 2020-08-15 VITALS — BP 136/84 | HR 76 | Temp 96.5°F | Resp 15 | Ht <= 58 in | Wt 107.0 lb

## 2020-08-15 DIAGNOSIS — E559 Vitamin D deficiency, unspecified: Secondary | ICD-10-CM | POA: Diagnosis not present

## 2020-08-15 DIAGNOSIS — E785 Hyperlipidemia, unspecified: Secondary | ICD-10-CM

## 2020-08-15 DIAGNOSIS — Z0000 Encounter for general adult medical examination without abnormal findings: Secondary | ICD-10-CM

## 2020-08-15 DIAGNOSIS — R5383 Other fatigue: Secondary | ICD-10-CM

## 2020-08-15 DIAGNOSIS — F411 Generalized anxiety disorder: Secondary | ICD-10-CM

## 2020-08-15 DIAGNOSIS — Z1231 Encounter for screening mammogram for malignant neoplasm of breast: Secondary | ICD-10-CM | POA: Diagnosis not present

## 2020-08-15 LAB — COMPREHENSIVE METABOLIC PANEL
ALT: 27 U/L (ref 0–35)
AST: 30 U/L (ref 0–37)
Albumin: 4.2 g/dL (ref 3.5–5.2)
Alkaline Phosphatase: 57 U/L (ref 39–117)
BUN: 26 mg/dL — ABNORMAL HIGH (ref 6–23)
CO2: 28 mEq/L (ref 19–32)
Calcium: 9.7 mg/dL (ref 8.4–10.5)
Chloride: 102 mEq/L (ref 96–112)
Creatinine, Ser: 0.82 mg/dL (ref 0.40–1.20)
GFR: 66.9 mL/min (ref >60.00)
Glucose, Bld: 79 mg/dL (ref 70–99)
Potassium: 4 mEq/L (ref 3.5–5.1)
Sodium: 139 mEq/L (ref 135–145)
Total Bilirubin: 0.4 mg/dL (ref 0.2–1.2)
Total Protein: 6.6 g/dL (ref 6.0–8.3)

## 2020-08-15 LAB — LIPID PANEL
Cholesterol: 203 mg/dL — ABNORMAL HIGH (ref 0–200)
HDL: 58.8 mg/dL (ref >39.00)
LDL Cholesterol: 124 mg/dL — ABNORMAL HIGH (ref 0–99)
NonHDL: 144.39
Total CHOL/HDL Ratio: 3
Triglycerides: 100 mg/dL (ref 0.0–149.0)
VLDL: 20 mg/dL (ref 0.0–40.0)

## 2020-08-15 LAB — VITAMIN B12: Vitamin B-12: 830 pg/mL (ref 211–911)

## 2020-08-15 LAB — VITAMIN D 25 HYDROXY (VIT D DEFICIENCY, FRACTURES): VITD: 63.48 ng/mL (ref 30.00–100.00)

## 2020-08-15 MED ORDER — ESCITALOPRAM OXALATE 5 MG PO TABS: 5.0000 mg | ORAL_TABLET | Freq: Every day | ORAL | 1 refills | Status: DC

## 2020-08-15 NOTE — Progress Notes (Signed)
Patient ID: Natalie Coleman, female    DOB: 1940-05-24  Age: 80 y.o. MRN: [id number]  The patient is here for annual preventive examination and management of other chronic and acute problems.   The risk factors are reflected in the social history.  The roster of all physicians providing medical care to patient - is listed in the Snapshot section of the chart.  Activities of daily living:  The patient is 100% independent in all ADLs: dressing, toileting, feeding as well as independent mobility  Home safety : The patient has smoke detectors in the home. They wear seatbelts.  There are no firearms at home. There is no violence in the home.   There is no risks for hepatitis, STDs or HIV. There is no   history of blood transfusion. They have no travel history to infectious disease endemic areas of the world.  The patient has seen their dentist in the last six month. They have seen their eye doctor in the last year. They admit to slight hearing difficulty with regard to whispered voices and some television programs.  They have deferred audiologic testing in the last year.  They do not  have excessive sun exposure. Discussed the need for sun protection: hats, long sleeves and use of sunscreen if there is significant sun exposure.   Diet: the importance of a healthy diet is discussed. They do have a healthy diet.  The benefits of regular aerobic exercise were discussed. She walks 4 times per week ,  20 minutes.   Depression/anxiety screen: there are  multiple signs or vegative symptoms of depression- irritability, change in appetite, anhedonia, sadness/tearfullness.  Cognitive assessment: the patient manages all their financial and personal affairs and is actively engaged. They could relate day,date,year and events; recalled 2/3 objects at 3 minutes; performed clock-face test normally.  The following portions of the patient's history were reviewed and updated as appropriate: allergies, current  medications, past family history, past medical history,  past surgical history, past social history  and problem list.  Visual acuity was not assessed per patient preference since she has regular follow up with her ophthalmologist. Hearing and body mass index were assessed and reviewed.   During the course of the visit the patient was educated and counseled about appropriate screening and preventive services including : fall prevention , diabetes screening, nutrition counseling, colorectal cancer screening, and recommended immunizations.    CC: The primary encounter diagnosis was Encounter for preventive health examination. Diagnoses of Breast cancer screening by mammogram, Hyperlipidemia with target LDL less than 130, Vitamin D deficiency, Fatigue, unspecified type, and Anxiety state were also pertinent to this visit.  1) increase anxiety ;  not getting along with daughter in law who has moved to [place] with her son. And grandchild.  Had   Tearful today in discussing her conflict with daughter in law.   2) having some memory issues  History Vendetta has a past medical history of Colon polyps, GERD (gastroesophageal reflux disease), Hemorrhoids, History of blood transfusion, and Osteoporosis.   She has a past surgical history that includes Fracture surgery; Cholecystectomy (2008); ORIF tibia & fibula fractures (March 2006); ORIF ankle fracture bimalleolar (March 2006); Colonoscopy (2009); Eye surgery (Right); Cesarean section ([id number]); and Colonoscopy with propofol (N/A, 12/10/2015).   Her family history includes Breast cancer in her cousin; Hypertension in her father, mother, and sister.She reports that she has never smoked. She has never used smokeless tobacco. She reports current alcohol use of about 1.0 standard drink  of alcohol per week. She reports that she does not use drugs.  Outpatient Medications Prior to Visit  Medication Sig Dispense Refill   ammonium lactate (AMLACTIN) 12 % lotion  Apply 1 application topically as needed for dry skin. 400 g 0   aspirin 81 MG tablet Take 81 mg by mouth daily.     Calcium Citrate 250 MG TABS Take 2 tablets by mouth 3 (three) times daily.     Coenzyme Q10 100 MG TABS Take 1 tablet by mouth daily.     denosumab (PROLIA) 60 MG/ML SOLN injection Inject 60 mg into the skin every 6 (six) months. Administer in upper arm, thigh, or abdomen     GARLIC PO Take 1 capsule by mouth daily.     Lactobacillus-Inulin (CULTURELLE DIGESTIVE HEALTH) CAPS Take 1 capsule by mouth daily. 14 capsule 0   Melatonin 5 MG TABS Take 1 tablet by mouth as needed.     Misc Natural Products (ADV TURMERIC CURCUMIN COMPLEX PO) Take 1 capsule by mouth daily.     Multiple Vitamins-Minerals (CENTRUM SILVER PO) Take 1 tablet by mouth daily.     VALERIAN ROOT PO Take 1 capsule by mouth as needed.     vitamin B-12 (CYANOCOBALAMIN) 1000 MCG tablet Take 5,000 mcg by mouth daily.      vitamin C (ASCORBIC ACID) 500 MG tablet Take 500 mg by mouth daily.     No facility-administered medications prior to visit.    Review of Systems  Patient denies headache, fevers, malaise, unintentional weight loss, skin rash, eye pain, sinus congestion and sinus pain, sore throat, dysphagia,  hemoptysis , cough, dyspnea, wheezing, chest pain, palpitations, orthopnea, edema, abdominal pain, nausea, melena, diarrhea, constipation, flank pain, dysuria, hematuria, urinary  Frequency, nocturia, numbness, tingling, seizures,  Focal weakness, Loss of consciousness,  Tremor,  and suicidal ideation.     Objective:  BP 136/84 (BP Location: Left Arm, Patient Position: Sitting, Cuff Size: Normal)   Pulse 76   Temp (!) 96.5 F (35.8 C) (Temporal)   Resp 15   Ht 4\' 6"  (1.372 m)   Wt 107 lb (48.5 kg)   SpO2 97%   BMI 25.80 kg/m   Physical Exam  General appearance: alert, cooperative and appears stated age Ears: normal TM's and external ear canals both ears Throat: lips, mucosa, and tongue normal;  teeth and gums normal Neck: no adenopathy, no carotid bruit, supple, symmetrical, trachea midline and thyroid not enlarged, symmetric, no tenderness/mass/nodules Back: symmetric, no curvature. ROM normal. No CVA tenderness. Lungs: clear to auscultation bilaterally Heart: regular rate and rhythm, S1, S2 normal, no murmur, click, rub or gallop Abdomen: soft, non-tender; bowel sounds normal; no masses,  no organomegaly Pulses: 2+ and symmetric Skin: Skin color, texture, turgor normal. No rashes or lesions Lymph nodes: Cervical, supraclavicular, and axillary nodes normal.  MMSE:  score 25/30 Psych: affect normal, makes good eye contact. No fidgeting,  Smiles easily.  Denies suicidal thoughts     Assessment & Plan:   Problem List Items Addressed This Visit       Unprioritized   Anxiety state    She has agreed to a trial of escitalopram starting with 5 mg dose        Encounter for preventive health examination - Primary    age appropriate education and counseling updated, referrals for preventative services and immunizations addressed, dietary and smoking counseling addressed, most recent labs reviewed.  I have personally reviewed and have noted:   1) the  patient's medical and social history 2) The pt's use of alcohol, tobacco, and illicit drugs 3) The patient's current medications and supplements 4) Functional ability including ADL's, fall risk, home safety risk, hearing and visual impairment 5) Diet and physical activities 6) Evidence for depression or mood disorder 7) The patient's height, weight, and BMI have been recorded in the chart   I have made referrals, and provided counseling and education based on review of the above       Hyperlipidemia with target LDL less than 130    Mild, with 10 year risk 10%.  No therapy advised   Lab Results  Component Value Date   CHOL 203 (H) 08/15/2020   HDL 58.80 08/15/2020   LDLCALC 124 (H) 08/15/2020   LDLDIRECT 115.0 01/16/2016    TRIG 100.0 08/15/2020   CHOLHDL 3 08/15/2020          Relevant Orders   Lipid panel (Completed)   Comprehensive metabolic panel (Completed)   Other Visit Diagnoses     Breast cancer screening by mammogram       Relevant Orders   MM 3D SCREEN BREAST BILATERAL   Vitamin D deficiency       Relevant Orders   VITAMIN D 25 Hydroxy (Vit-D Deficiency, Fractures) (Completed)   Fatigue, unspecified type       Relevant Orders   Vitamin B12 (Completed)       I am having Tykiera C. Sweeden maintain her Multiple Vitamins-Minerals (CENTRUM SILVER PO), GARLIC PO, vitamin C, vitamin B-12, aspirin, VALERIAN ROOT PO, melatonin, Culturelle Digestive Health, Coenzyme Q10, denosumab, Calcium Citrate, ammonium lactate, and Misc Natural Products (ADV TURMERIC CURCUMIN COMPLEX PO).  No orders of the defined types were placed in this encounter.   There are no discontinued medications.  Follow-up: Return in about 4 weeks (around 09/12/2020).   Sherlene Shams, MD

## 2020-08-15 NOTE — Telephone Encounter (Signed)
Pt called and stated that she is at the pharmacy to pick up her medication that was supposed to be sent in today. Pt stated that the pharmacy has not received a rx.

## 2020-08-15 NOTE — Patient Instructions (Addendum)
You do not have dementia. . Your anxiety is affecting your CONCENTRATION   Please start the Lexapro (escitalopram) at 1/2 tablet daily  WITH A FULL BREAKFAST FOR HE FIRST WEEK to  avoid nausea.  You can increase to a full tablet after  8 days  if you have not developed side effects of nausea.  Please return in  4 weeks   My [facility] is [facility] [employer] [employer] [facility] [address] in [place], [place].  Our Sunday service is at 11:00 am and pastor is Princella Ion    Your annual mammogram has been ordered.  Please call  [facility] to make your appointment  [phone]

## 2020-08-17 NOTE — Assessment & Plan Note (Signed)

## 2020-08-17 NOTE — Assessment & Plan Note (Signed)
She has agreed to a trial of escitalopram starting with 5 mg dose

## 2020-08-17 NOTE — Assessment & Plan Note (Signed)
Mild, with 10 year risk 10%.  No therapy advised   Lab Results  Component Value Date   CHOL 203 (H) 08/15/2020   HDL 58.80 08/15/2020   LDLCALC 124 (H) 08/15/2020   LDLDIRECT 115.0 01/16/2016   TRIG 100.0 08/15/2020   CHOLHDL 3 08/15/2020

## 2020-08-29 DIAGNOSIS — L720 Epidermal cyst: Secondary | ICD-10-CM | POA: Diagnosis not present

## 2020-08-29 DIAGNOSIS — L821 Other seborrheic keratosis: Secondary | ICD-10-CM | POA: Diagnosis not present

## 2020-09-04 ENCOUNTER — Other Ambulatory Visit: Payer: Self-pay

## 2020-09-04 ENCOUNTER — Ambulatory Visit
Admission: RE | Admit: 2020-09-04 | Discharge: 2020-09-04 | Disposition: A | Discharge status: Home / Self Care | Payer: Medicare PPO | Source: Ambulatory Visit | Attending: Internal Medicine | Admitting: Internal Medicine

## 2020-09-04 DIAGNOSIS — M810 Age-related osteoporosis without current pathological fracture: Secondary | ICD-10-CM

## 2020-09-04 DIAGNOSIS — Z1231 Encounter for screening mammogram for malignant neoplasm of breast: Secondary | ICD-10-CM | POA: Insufficient documentation

## 2020-09-17 ENCOUNTER — Other Ambulatory Visit: Payer: Self-pay

## 2020-09-17 ENCOUNTER — Ambulatory Visit (INDEPENDENT_AMBULATORY_CARE_PROVIDER_SITE_OTHER): Payer: Medicare PPO | Admitting: Internal Medicine

## 2020-09-17 ENCOUNTER — Telehealth: Payer: Self-pay | Admitting: Internal Medicine

## 2020-09-17 ENCOUNTER — Encounter: Payer: Self-pay | Admitting: Internal Medicine

## 2020-09-17 DIAGNOSIS — F411 Generalized anxiety disorder: Secondary | ICD-10-CM

## 2020-09-17 MED ORDER — ESCITALOPRAM OXALATE 5 MG PO TABS: 5.0000 mg | ORAL_TABLET | Freq: Every day | ORAL | 1 refills | Status: DC

## 2020-09-17 NOTE — Telephone Encounter (Signed)
Patient was seen this morning by Dr Darrick Huntsman. She said that Dr Darrick Huntsman was going to send a prescription to her pharmacy today. Patient did not know what she is suppose to pick up and her pharmacy does not have any medication for her to pick up.

## 2020-09-17 NOTE — Progress Notes (Signed)
Pre visit review using our clinic review tool, if applicable. No additional management support is needed unless otherwise documented below in the visit note. 

## 2020-09-17 NOTE — Telephone Encounter (Signed)
She needs to check at home for escitalopram that she picked up (perpharmacy!) on July 1 for 90 days supply

## 2020-09-17 NOTE — Patient Instructions (Addendum)
You do not have a memory problem  Your anxiety is causing your lack of concentration  Last month I asked you to start taking a medication for your anxiety called Lexapro ("escitalopram " is the generic term)  Please start the Lexapro (escitalopram) at 1/2 tablet daily in the evening for the first few days to avoid nausea.  You can increase to a full tablet after 4 days if you havenot developed side effects of nausea.   If the lexapro interferes with your sleep, take it in the morning instead  Please return in  4 weeks ,  Or e mail me to let me know how it is helping your depression

## 2020-09-17 NOTE — Progress Notes (Signed)
Subjective:  Patient ID: Natalie Coleman, female    DOB: 11-12-1940  Age: 80 y.o. MRN: [id number]  CC: The encounter diagnosis was Anxiety state.  HPI Natalie Coleman presents for  FOLLOW UP ON ANXIETY  This visit occurred during the SARS-CoV-2 public health emergency.  Safety protocols were in place, including screening questions prior to the visit, additional usage of staff PPE, and extensive cleaning of exam room while observing appropriate contact time as indicated for disinfecting solutions.    patient was seen one month ago for annual exam and reported increased anxiety, forgetfulness and tearfulness. She was screened for dementia and MMSE was 28/30 .  She was reassured that she had no evidence of cognitive dysfunction and agreed to a trial of lexapro .   She returns today and DOES NOT RECALL THE CONVERSATION ONE MONTH AGO ABOUT HER ANXIETY AND DEPRESSION.  She does not recall picking up the medication that her pharmacy confirms was picked up on july 1.  She continues to cite anxiety due to world events,  the destabilizing of conditions in the [place], and continued conflict with daughter in law who lives in [place].  She is quite concerned about her daughter finding out about her conflict with her daughter in law, for unclear reasons.    MMSE repeated :  she scored 28/30 (recall at 5 min:  1/3)      Outpatient Medications Prior to Visit  Medication Sig Dispense Refill   ammonium lactate (AMLACTIN) 12 % lotion Apply 1 application topically as needed for dry skin. 400 g 0   aspirin 81 MG tablet Take 81 mg by mouth daily.     Calcium Citrate 250 MG TABS Take 2 tablets by mouth 3 (three) times daily.     Coenzyme Q10 100 MG TABS Take 1 tablet by mouth daily.     denosumab (PROLIA) 60 MG/ML SOLN injection Inject 60 mg into the skin every 6 (six) months. Administer in upper arm, thigh, or abdomen     GARLIC PO Take 1 capsule by mouth daily.     Lactobacillus-Inulin (CULTURELLE DIGESTIVE  HEALTH) CAPS Take 1 capsule by mouth daily. 14 capsule 0   Melatonin 5 MG TABS Take 1 tablet by mouth as needed.     Misc Natural Products (ADV TURMERIC CURCUMIN COMPLEX PO) Take 1 capsule by mouth daily.     Multiple Vitamins-Minerals (CENTRUM SILVER PO) Take 1 tablet by mouth daily.     VALERIAN ROOT PO Take 1 capsule by mouth as needed.     vitamin B-12 (CYANOCOBALAMIN) 1000 MCG tablet Take 5,000 mcg by mouth daily.      vitamin C (ASCORBIC ACID) 500 MG tablet Take 500 mg by mouth daily.     escitalopram (LEXAPRO) 5 MG tablet Take 1 tablet (5 mg total) by mouth daily. 90 tablet 1   No facility-administered medications prior to visit.    Review of Systems;  Patient denies headache, fevers, malaise, unintentional weight loss, skin rash, eye pain, sinus congestion and sinus pain, sore throat, dysphagia,  hemoptysis , cough, dyspnea, wheezing, chest pain, palpitations, orthopnea, edema, abdominal pain, nausea, melena, diarrhea, constipation, flank pain, dysuria, hematuria, urinary  Frequency, nocturia, numbness, tingling, seizures,  Focal weakness, Loss of consciousness,  Tremor, insomnia, depression,  and suicidal ideation.      Objective:  BP 122/80   Pulse 72   Temp 97.8 F (36.6 C) (Skin)   Ht 4\' 6"  (1.372 m)   Wt 108 lb (  49 kg)   SpO2 96%   BMI 26.04 kg/m   BP Readings from Last 3 Encounters:  09/17/20 122/80  08/15/20 136/84  06/24/20 (!) 176/70    Wt Readings from Last 3 Encounters:  09/17/20 108 lb (49 kg)  08/15/20 107 lb (48.5 kg)  06/24/20 105 lb 13.1 oz (48 kg)    General appearance: alert, cooperative and appears younger than stated age Ears: normal TM's and external ear canals both ears Throat: lips, mucosa, and tongue normal; teeth and gums normal Neck: no adenopathy, no carotid bruit, supple, symmetrical, trachea midline and thyroid not enlarged, symmetric, no tenderness/mass/nodules Back: symmetric, no curvature. ROM normal. No CVA tenderness. Lungs:  clear to auscultation bilaterally Heart: regular rate and rhythm, S1, S2 normal, no murmur, click, rub or gallop Abdomen: soft, non-tender; bowel sounds normal; no masses,  no organomegaly Pulses: 2+ and symmetric Skin: Skin color, texture, turgor normal. No rashes or lesions Lymph nodes: Cervical, supraclavicular, and axillary nodes normal. Psych: affect  anxious, tearful , makes good eye contact. No fidgeting,   Denies suicidal thoughts    No results found for: HGBA1C  Lab Results  Component Value Date   CREATININE 0.82 08/15/2020   CREATININE 0.66 06/24/2020   CREATININE 0.74 06/28/2019    Lab Results  Component Value Date   WBC 16.1 (H) 06/24/2020   HGB 14.2 06/24/2020   HCT 41.8 06/24/2020   PLT 264 06/24/2020   GLUCOSE 79 08/15/2020   CHOL 203 (H) 08/15/2020   TRIG 100.0 08/15/2020   HDL 58.80 08/15/2020   LDLDIRECT 115.0 01/16/2016   LDLCALC 124 (H) 08/15/2020   ALT 27 08/15/2020   AST 30 08/15/2020   NA 139 08/15/2020   K 4.0 08/15/2020   CL 102 08/15/2020   CREATININE 0.82 08/15/2020   BUN 26 (H) 08/15/2020   CO2 28 08/15/2020   TSH 1.51 06/28/2019    DEXAScan  Result Date: 09/05/2020 EXAM: DUAL X-RAY ABSORPTIOMETRY (DXA) FOR BONE MINERAL DENSITY IMPRESSION: Your patient Natalie Coleman completed a BMD test on 09/04/2020 using the [employer] iDXA DXA System (software version: 14.10) manufactured by [employer]. The following summarizes the results of our evaluation. Technologist: SCE PATIENT BIOGRAPHICAL: Name: Natalie Coleman, Natalie Coleman Patient ID: [id number] Birth Date: 01/20/1941 Height: 53.5 in. Gender: Female Exam Date: 09/04/2020 Weight: 103.3 lbs. Indications: Advanced Age, Early Menopause, Height Loss, High Risk Meds, History of Fracture (Adult), History of Osteoporosis, Hysterectomy, Postmenopausal Fractures: Tibia/Fibula Treatments: Calcium, Multi-Vitamin, Prolia DENSITOMETRY RESULTS: Site         Region     Measured Date Measured Age WHO Classification  Young Adult T-score BMD         %Change vs. Previous Significant Change (*) DualFemur Neck Right 09/04/2020 81.8 Osteoporosis -2.5 0.693 g/cm2 -2.9% - DualFemur Neck Right 01/12/2016 77.2 Osteopenia -2.3 0.714 g/cm2 - - DualFemur Total Mean 09/04/2020 81.8 Osteopenia -2.0 0.753 g/cm2 6.2% Yes DualFemur Total Mean 01/12/2016 77.2 Osteopenia -2.4 0.709 g/cm2 - - Left Forearm Radius 33% 09/04/2020 81.8 Osteoporosis -2.8 0.635 g/cm2 - - ASSESSMENT: The BMD measured at Forearm Radius 33% is 0.635 g/cm2 with a T-score of -2.8. This patient is considered osteoporotic according to World Health Organization [facility]) criteria. The scan quality is good. Lumbar spine was not utilized due to advanced degenerative changes. Compared with prior study, there has been a significant increase in the total hip. World Health Organization [facility]) criteria for post-menopausal, Caucasian Women: Normal:  T-score at or above -1 SD Osteopenia/low bone mass: T-score between -1 and -2.5 SD Osteoporosis:             T-score at or below -2.5 SD RECOMMENDATIONS: 1. All patients should optimize calcium and vitamin D intake. 2. Consider FDA-approved medical therapies in postmenopausal women and men aged 40 years and older, based on the following: a. A hip or vertebral(clinical or morphometric) fracture b. T-score < -2.5 at the femoral neck or spine after appropriate evaluation to exclude secondary causes c. Low bone mass (T-score between -1.0 and -2.5 at the femoral neck or spine) and a 10-year probability of a hip fracture > 3% or a 10-year probability of a major osteoporosis-related fracture > 20% based on the [place]-adapted WHO algorithm 3. Clinician judgment and/or patient preferences may indicate treatment for people with 10-year fracture probabilities above or below these levels FOLLOW-UP: People with diagnosed cases of osteoporosis or at high risk for fracture should have regular bone mineral density tests. For patients eligible for  Medicare, routine testing is allowed once every 2 years. The testing frequency can be increased to one year for patients who have rapidly progressing disease, those who are receiving or discontinuing medical therapy to restore bone mass, or have additional risk factors. I have reviewed this report, and agree with the above findings. Mark A. Tyron Russell, M.D. [facility] Electronically Signed   By: Ulyses Southward M.D.   On: 09/05/2020 08:12   MM 3D SCREEN BREAST BILATERAL  Result Date: 09/05/2020 CLINICAL DATA:  Screening. EXAM: DIGITAL SCREENING BILATERAL MAMMOGRAM WITH TOMOSYNTHESIS AND CAD TECHNIQUE: Bilateral screening digital craniocaudal and mediolateral oblique mammograms were obtained. Bilateral screening digital breast tomosynthesis was performed. The images were evaluated with computer-aided detection. COMPARISON:  Previous exam(s). ACR Breast Density Category c: The breast tissue is heterogeneously dense, which may obscure small masses. FINDINGS: There are no findings suspicious for malignancy. IMPRESSION: No mammographic evidence of malignancy. A result letter of this screening mammogram will be mailed directly to the patient. RECOMMENDATION: Screening mammogram in one year. (Code:SM-B-01Y) BI-RADS CATEGORY  1: Negative. Electronically Signed   By: Amie Portland M.D.   On: 09/05/2020 14:07   Assessment & Plan:   Problem List Items Addressed This Visit       Unprioritized   Anxiety state    I have again encouraged her to have a trial of lexapro, although at this point I am less optimistic about her response to it given her apparent indifference .  She is clearly miserable due to her anxiety but does not apparently want a solution that is realistic.  I have resent the lexapro to her pharmacy, since she reports (once home) that she does not have it at home and must have thrown it away        I am having Natalie Coleman maintain her Multiple Vitamins-Minerals (CENTRUM SILVER  PO), GARLIC PO, vitamin C, vitamin B-12, aspirin, VALERIAN ROOT PO, melatonin, Culturelle Digestive Health, Coenzyme Q10, denosumab, Calcium Citrate, ammonium lactate, and Misc Natural Products (ADV TURMERIC CURCUMIN COMPLEX PO).  No orders of the defined types were placed in this encounter.   There are no discontinued medications.  Follow-up: No follow-ups on file.   Sherlene Shams, MD

## 2020-09-18 NOTE — Assessment & Plan Note (Addendum)
I have again encouraged her to have a trial of lexapro, although at this point I am less optimistic about her response to it given her apparent indifference .  She is clearly miserable due to her anxiety but does not apparently want a solution that is realistic.  I have resent the lexapro to her pharmacy, since she reports (once home) that she does not have it at home and must have thrown it away

## 2020-10-15 ENCOUNTER — Telehealth: Payer: Self-pay | Admitting: Internal Medicine

## 2020-10-15 NOTE — Telephone Encounter (Signed)
Robi Mitter Key: [id number] - PA Case ID: [id number]

## 2020-10-17 NOTE — Telephone Encounter (Signed)
  PA Case: [id number], Status: Approved, Coverage Starts on: 04/10/2019 12:00:00 AM, Coverage Ends on: 02/14/2021 12:00:00 AM. Questions? Contact [phone].  Patient has been scheduled for 11/06/20

## 2020-11-06 ENCOUNTER — Other Ambulatory Visit: Payer: Self-pay

## 2020-11-06 ENCOUNTER — Ambulatory Visit (INDEPENDENT_AMBULATORY_CARE_PROVIDER_SITE_OTHER): Payer: Medicare PPO

## 2020-11-06 DIAGNOSIS — M810 Age-related osteoporosis without current pathological fracture: Secondary | ICD-10-CM | POA: Diagnosis not present

## 2020-11-06 MED ORDER — DENOSUMAB 60 MG/ML ~~LOC~~ SOSY
60.0000 mg | PREFILLED_SYRINGE | Freq: Once | SUBCUTANEOUS | Status: AC
  Administered 2020-11-06: 60 mg via SUBCUTANEOUS

## 2020-11-06 NOTE — Progress Notes (Signed)
Patient presented for a prolia injection to the right arm. Pt voiced no concerns or discomfort during time of injection.

## 2021-02-20 ENCOUNTER — Ambulatory Visit: Payer: Medicare PPO | Admitting: Family

## 2021-04-06 ENCOUNTER — Telehealth: Payer: Self-pay | Admitting: Internal Medicine

## 2021-04-06 NOTE — Telephone Encounter (Signed)
Insurance approval from 02/15/2021 until 02/14/2022 for Prolia [id number] approval number.

## 2021-04-09 NOTE — Telephone Encounter (Signed)
Please schedule Nurse visit for on or after 05/06/2021.

## 2021-04-09 NOTE — Telephone Encounter (Signed)
Scheduled

## 2021-05-07 ENCOUNTER — Ambulatory Visit (INDEPENDENT_AMBULATORY_CARE_PROVIDER_SITE_OTHER): Payer: Medicare PPO | Admitting: *Deleted

## 2021-05-07 ENCOUNTER — Other Ambulatory Visit: Payer: Self-pay

## 2021-05-07 DIAGNOSIS — M810 Age-related osteoporosis without current pathological fracture: Secondary | ICD-10-CM

## 2021-05-07 MED ORDER — DENOSUMAB 60 MG/ML ~~LOC~~ SOSY
60.0000 mg | PREFILLED_SYRINGE | Freq: Once | SUBCUTANEOUS | Status: AC
  Administered 2021-05-07: 60 mg via SUBCUTANEOUS

## 2021-05-07 NOTE — Progress Notes (Signed)
Patient presented for Prolia injection to Right arm Jarrell, patient voiced no concerns or complaints during or after injection. 

## 2021-06-17 ENCOUNTER — Ambulatory Visit: Payer: Medicare PPO

## 2021-06-29 ENCOUNTER — Ambulatory Visit (INDEPENDENT_AMBULATORY_CARE_PROVIDER_SITE_OTHER): Payer: Medicare PPO

## 2021-06-29 VITALS — BP 124/82 | HR 72 | Temp 97.1°F | Resp 15 | Ht <= 58 in | Wt 109.8 lb

## 2021-06-29 DIAGNOSIS — Z0000 Encounter for general adult medical examination without abnormal findings: Secondary | ICD-10-CM | POA: Diagnosis not present

## 2021-06-29 NOTE — Progress Notes (Addendum)
Subjective:   Natalie Coleman is a 81 y.o. female who presents for Medicare Annual (Subsequent) preventive examination.  Review of Systems    No ROS.  Medicare Wellness    Cardiac Risk Factors include: advanced age (>17men, >58 women)     Objective:    Today's Vitals   06/29/21 1415  BP: 124/82  Pulse: 72  Resp: 15  Temp: (!) 97.1 F (36.2 C)  Weight: 109 lb 12.8 oz (49.8 kg)  Height: 4\' 6"  (1.372 m)   Body mass index is 26.47 kg/m.     06/29/2021    2:31 PM 06/24/2020    3:40 PM 06/13/2020   10:53 AM 06/13/2019    9:58 AM 06/05/2018    9:07 AM 05/03/2017    2:41 PM 02/02/2016    4:20 PM  Advanced Directives  Does Patient Have a Medical Advance Directive? No No No No No No No  Would patient like information on creating a medical advance directive? No - Patient declined  No - Patient declined No - Patient declined No - Patient declined No - Patient declined No - Patient declined    Current Medications (verified) Outpatient Encounter Medications as of 06/29/2021  Medication Sig   ammonium lactate (AMLACTIN) 12 % lotion Apply 1 application topically as needed for dry skin.   aspirin 81 MG tablet Take 81 mg by mouth daily.   Calcium Citrate 250 MG TABS Take 2 tablets by mouth 3 (three) times daily.   Coenzyme Q10 100 MG TABS Take 1 tablet by mouth daily.   denosumab (PROLIA) 60 MG/ML SOLN injection Inject 60 mg into the skin every 6 (six) months. Administer in upper arm, thigh, or abdomen   escitalopram (LEXAPRO) 5 MG tablet Take 1 tablet (5 mg total) by mouth daily.   GARLIC PO Take 1 capsule by mouth daily.   Lactobacillus-Inulin ([place]) CAPS Take 1 capsule by mouth daily.   Melatonin 5 MG TABS Take 1 tablet by mouth as needed.   Misc Natural Products (ADV TURMERIC CURCUMIN COMPLEX PO) Take 1 capsule by mouth daily.   Multiple Vitamins-Minerals (CENTRUM SILVER PO) Take 1 tablet by mouth daily.   VALERIAN ROOT PO Take 1 capsule by mouth as  needed.   vitamin B-12 (CYANOCOBALAMIN) 1000 MCG tablet Take 5,000 mcg by mouth daily.    vitamin C (ASCORBIC ACID) 500 MG tablet Take 500 mg by mouth daily.   No facility-administered encounter medications on file as of 06/29/2021.    Allergies (verified) Dexamethasone   History: Past Medical History:  Diagnosis Date   Colon polyps    GERD (gastroesophageal reflux disease)    Hemorrhoids    History of blood transfusion    Osteoporosis    Past Surgical History:  Procedure Laterality Date   CESAREAN SECTION  [id number]   CHOLECYSTECTOMY  2008   COLONOSCOPY  2009   COLONOSCOPY WITH PROPOFOL N/A 12/10/2015   Procedure: COLONOSCOPY WITH PROPOFOL;  Surgeon: Robert Bellow, MD;  Location: [facility] ENDOSCOPY;  Service: Endoscopy;  Laterality: N/A;   EYE SURGERY Right    FRACTURE SURGERY     right tibia   ORIF ANKLE FRACTURE BIMALLEOLAR  March 2006   ORIF TIBIA & FIBULA FRACTURES  March 2006   Family History  Problem Relation Age of Onset   Hypertension Father    Hypertension Mother    Hypertension Sister    Breast cancer Cousin    Cancer Neg Hx    Social History  Socioeconomic History   Marital status: Divorced    Spouse name: Not on file   Number of children: Not on file   Years of education: Not on file   Highest education level: Not on file  Occupational History   Not on file  Tobacco Use   Smoking status: Never   Smokeless tobacco: Never  Substance and Sexual Activity   Alcohol use: Yes    Alcohol/week: 1.0 standard drink    Types: 1 Glasses of wine per week    Comment: ocassional   Drug use: No   Sexual activity: Never  Other Topics Concern   Not on file  Social History Narrative   Not on file   Social Determinants of Health   Financial Resource Strain: Low Risk    Difficulty of Paying Living Expenses: Not hard at all  Food Insecurity: No Food Insecurity   Worried About Charity fundraiser in the Last Year: Never true   [place] in the Last  Year: Never true  Transportation Needs: No Transportation Needs   Lack of Transportation (Medical): No   Lack of Transportation (Non-Medical): No  Physical Activity: Not on file  Stress: No Stress Concern Present   Feeling of Stress : Not at all  Social Connections: Unknown   Frequency of Communication with Friends and Family: Once a week   Frequency of Social Gatherings with Friends and Family: More than three times a week   Attends Religious Services: Not on Electrical engineer or Organizations: Not on file   Attends Archivist Meetings: Not on file   Marital Status: Not on file    Tobacco Counseling Counseling given: Not Answered   Clinical Intake:  Pre-visit preparation completed: Yes        Diabetes: No  How often do you need to have someone help you when you read instructions, pamphlets, or other written materials from your doctor or pharmacy?: 1 - Never    Interpreter Needed?: No      Activities of Daily Living    06/29/2021    2:17 PM  In your present state of health, do you have any difficulty performing the following activities:  Hearing? 0  Vision? 0  Difficulty concentrating or making decisions? 0  Walking or climbing stairs? 0  Dressing or bathing? 0  Doing errands, shopping? 0  Preparing Food and eating ? N  Comment Daughter assist as needed with meal prep. Self feeds.  Using the Toilet? N  In the past six months, have you accidently leaked urine? N  Do you have problems with loss of bowel control? N  Managing your Medications? N  Managing your Finances? Y  Comment Daughter assist as needed  Housekeeping or managing your Housekeeping? N  Comment Daughter assist as needed    Patient Care Team: Crecencio Mc, MD as PCP - General (Internal Medicine) Bary Castilla Forest Gleason, MD (General Surgery) Crecencio Mc, MD (Internal Medicine)  Indicate any recent Medical Services you may have received from other than [facility] providers  in the past year (date may be approximate).     Assessment:   This is a routine wellness examination for Natalie Coleman.  Hearing/Vision screen Hearing Screening - Comments:: Patient is able to hear conversational tones without difficulty.  No issues reported. Vision Screening - Comments:: Followed by Suzie Portela ([place])  Wears corrective lenses  Cataract extraction, R eye only   Dietary issues and exercise activities  discussed: Current Exercise Habits: Home exercise routine Healthy diet Good water    Goals Addressed               This Visit's Progress     Patient Stated     Increase physial activity (pt-stated)        Stay active and walk more for exercise.        Depression Screen    06/29/2021    2:30 PM 09/17/2020    9:20 AM 08/15/2020   11:37 AM 06/13/2020   10:46 AM 09/03/2019    1:22 PM 06/13/2019    9:43 AM 06/05/2018    9:10 AM  PHQ 2/9 Scores  PHQ - 2 Score 1 2 5  0 0 1 0  PHQ- 9 Score  6 15        Fall Risk    06/29/2021    2:30 PM 09/17/2020    9:17 AM 08/15/2020   10:47 AM 06/13/2020   10:54 AM 09/03/2019    1:22 PM  Fall Risk   Falls in the past year? 0 0 0 0 0  Number falls in past yr: 0 0  0 0  Injury with Fall?  0  0   Follow up  Falls evaluation completed Falls evaluation completed Falls evaluation completed Falls evaluation completed    FALL RISK PREVENTION PERTAINING TO THE HOME: Home free of loose throw rugs in walkways, pet beds, electrical cords, etc? Yes  Adequate lighting in your home to reduce risk of falls? Yes   ASSISTIVE DEVICES UTILIZED TO PREVENT FALLS: Life alert? No  Use of a cane, walker or w/c? No   TIMED UP AND GO: Was the test performed? Yes .  Length of time to ambulate 10 feet: 10 sec.   Gait steady and fast without use of assistive device  Cognitive Function: Patient is alert and oriented x3.     12/30/2014    9:34 AM  MMSE - Mini Mental State Exam  Orientation to time 5  Orientation to Place 5   Registration 3  Attention/ Calculation 5  Recall 3  Language- name 2 objects 2  Language- repeat 1  Language- follow 3 step command 3  Language- read & follow direction 1  Write a sentence 1  Copy design 1  Total score 30        06/29/2021    2:39 PM 06/13/2020   11:02 AM 06/13/2019   10:07 AM 06/05/2018    9:18 AM 05/03/2017    3:01 PM  6CIT Screen  What Year? 0 points 0 points 0 points 0 points 0 points  What month? 0 points 0 points 0 points 0 points 0 points  What time? 0 points 0 points 0 points 0 points 0 points  Count back from 20 0 points 0 points  0 points 0 points  Months in reverse 0 points 0 points  0 points 0 points  Repeat phrase  10 points  0 points 0 points  Total Score  10 points  0 points 0 points    Immunizations Immunization History  Administered Date(s) Administered   Pneumococcal Conjugate-13 08/15/2012   Pneumococcal Polysaccharide-23 08/17/2004, 01/13/2015   Td 08/15/2004   Zoster, Live 01/18/2014    TDAP status: Due, Education has been provided regarding the importance of this vaccine. Advised may receive this vaccine at local pharmacy or Health Dept. Aware to provide a copy of the vaccination record if obtained from local pharmacy or Health Dept.  Verbalized acceptance and understanding.  Shingrix Completed?: No.    Education has been provided regarding the importance of this vaccine. Patient has been advised to call insurance company to determine out of pocket expense if they have not yet received this vaccine. Advised may also receive vaccine at local pharmacy or Health Dept. Verbalized acceptance and understanding.  Screening Tests Health Maintenance  Topic Date Due   Zoster Vaccines- Shingrix (1 of 2) 09/29/2021 (Originally 10/24/1988)   TETANUS/TDAP  06/30/2022 (Originally 08/16/2014)   MAMMOGRAM  09/04/2021   Pneumonia Vaccine 75+ Years old  Completed   DEXA SCAN  Completed   HPV VACCINES  Aged Out   INFLUENZA VACCINE  Discontinued    COVID-19 Vaccine  Discontinued   Health Maintenance There are no preventive care reminders to display for this patient.  Lung Cancer Screening: (Low Dose CT Chest recommended if Age 21-80 years, 30 pack-year currently smoking OR have quit w/in 15years.) does not qualify.   Hepatitis C Screening: does not qualify.  Vision Screening: Recommended annual ophthalmology exams for early detection of glaucoma and other disorders of the eye.  Dental Screening: Recommended annual dental exams for proper oral hygiene  Community Resource Referral / Chronic Care Management: CRR required this visit?  No   CCM required this visit?  No      Plan:   Keep all routine maintenance appointments.   I have personally reviewed and noted the following in the patient's chart:   Medical and social history Use of alcohol, tobacco or illicit drugs  Current medications and supplements including opioid prescriptions.  Functional ability and status Nutritional status Physical activity Advanced directives List of other physicians Hospitalizations, surgeries, and ER visits in previous 12 months Vitals Screenings to include cognitive, depression, and falls Referrals and appointments  In addition, I have reviewed and discussed with patient certain preventive protocols, quality metrics, and best practice recommendations. A written personalized care plan for preventive services as well as general preventive health recommendations were provided to patient.     OBrien-Blaney, Amberlyn Martinezgarcia L, LPN   [id number]     I have reviewed the above information and agree with above.   Deborra Medina, MD

## 2021-06-29 NOTE — Patient Instructions (Addendum)
?  Natalie Coleman , ?Thank you for taking time to come for your Medicare Wellness Visit. I appreciate your ongoing commitment to your health goals. Please review the following plan we discussed and let me know if I can assist you in the future.  ? ?These are the goals we discussed: ? Goals   ? ?  ? Patient Stated  ?    Complete Advanced Directive  (pt-stated)   ?   Living Will/HCPOA; plans to discuss with daughter ?  ?   Increase physial activity (pt-stated)   ?   Stay active and walk more for exercise.  ?  ? ?  ?  ?This is a list of the screening recommended for you and due dates:  ?Health Maintenance  ?Topic Date Due  ? Zoster (Shingles) Vaccine (1 of 2) 09/29/2021*  ? Tetanus Vaccine  06/30/2022*  ? Mammogram  09/04/2021  ? Pneumonia Vaccine  Completed  ? DEXA scan (bone density measurement)  Completed  ? HPV Vaccine  Aged Out  ? Flu Shot  Discontinued  ? COVID-19 Vaccine  Discontinued  ?*Topic was postponed. The date shown is not the original due date.  ?  ?

## 2021-09-22 ENCOUNTER — Telehealth: Payer: Self-pay

## 2021-09-22 DIAGNOSIS — R7301 Impaired fasting glucose: Secondary | ICD-10-CM

## 2021-09-22 DIAGNOSIS — E785 Hyperlipidemia, unspecified: Secondary | ICD-10-CM

## 2021-09-22 DIAGNOSIS — R5383 Other fatigue: Secondary | ICD-10-CM

## 2021-09-22 NOTE — Telephone Encounter (Signed)
Patient states she usually has labs on the same day she has her physical, but she doesn't have a lab appointment scheduled.  I let her know that we do not have any lab orders for her in Epic, so I will check to see if Dr. Duncan Dull would like for her to have labs drawn.

## 2021-09-23 NOTE — Telephone Encounter (Signed)
Labs have been ordered and pt has been scheduled for a lab appt. Pt is aware of appt date and time.  

## 2021-09-25 ENCOUNTER — Encounter: Payer: Self-pay | Admitting: *Deleted

## 2021-10-02 ENCOUNTER — Telehealth: Payer: Self-pay | Admitting: *Deleted

## 2021-10-02 NOTE — Telephone Encounter (Signed)
$  80 due for [facility] Approved Auth# [id number] Eff: 04/10/19-02/14/22  Appt scheduled on 11/10/21 Mailed letter to patient on 09/25/21

## 2021-11-10 ENCOUNTER — Ambulatory Visit: Payer: Medicare PPO

## 2021-11-10 ENCOUNTER — Other Ambulatory Visit: Payer: Medicare PPO

## 2021-11-12 ENCOUNTER — Other Ambulatory Visit (INDEPENDENT_AMBULATORY_CARE_PROVIDER_SITE_OTHER): Payer: Medicare PPO

## 2021-11-12 DIAGNOSIS — E785 Hyperlipidemia, unspecified: Secondary | ICD-10-CM | POA: Diagnosis not present

## 2021-11-12 DIAGNOSIS — R7301 Impaired fasting glucose: Secondary | ICD-10-CM

## 2021-11-12 DIAGNOSIS — R5383 Other fatigue: Secondary | ICD-10-CM

## 2021-11-12 LAB — CBC WITH DIFFERENTIAL/PLATELET
Basophils Absolute: 0 10*3/uL (ref 0.0–0.1)
Basophils Relative: 0.8 % (ref 0.0–3.0)
Eosinophils Absolute: 0.2 10*3/uL (ref 0.0–0.7)
Eosinophils Relative: 3.3 % (ref 0.0–5.0)
HCT: 37.3 % (ref 36.0–46.0)
Hemoglobin: 12.6 g/dL (ref 12.0–15.0)
Lymphocytes Relative: 27.3 % (ref 12.0–46.0)
Lymphs Abs: 1.7 10*3/uL (ref 0.7–4.0)
MCHC: 33.8 g/dL (ref 30.0–36.0)
MCV: 91.9 fl (ref 78.0–100.0)
Monocytes Absolute: 0.6 10*3/uL (ref 0.1–1.0)
Monocytes Relative: 9.5 % (ref 3.0–12.0)
Neutro Abs: 3.8 10*3/uL (ref 1.4–7.7)
Neutrophils Relative %: 59.1 % (ref 43.0–77.0)
Platelets: 239 10*3/uL (ref 150.0–400.0)
RBC: 4.06 Mil/uL (ref 3.87–5.11)
RDW: 13.5 % (ref 11.5–15.5)
WBC: 6.3 10*3/uL (ref 4.0–10.5)

## 2021-11-12 LAB — LIPID PANEL
Cholesterol: 227 mg/dL — ABNORMAL HIGH (ref 0–200)
HDL: 62.2 mg/dL (ref >39.00)
LDL Cholesterol: 146 mg/dL — ABNORMAL HIGH (ref 0–99)
NonHDL: 164.57
Total CHOL/HDL Ratio: 4
Triglycerides: 95 mg/dL (ref 0.0–149.0)
VLDL: 19 mg/dL (ref 0.0–40.0)

## 2021-11-12 LAB — TSH: TSH: 1.66 u[IU]/mL (ref 0.35–5.50)

## 2021-11-12 LAB — COMPREHENSIVE METABOLIC PANEL
ALT: 14 U/L (ref 0–35)
AST: 17 U/L (ref 0–37)
Albumin: 4.2 g/dL (ref 3.5–5.2)
Alkaline Phosphatase: 44 U/L (ref 39–117)
BUN: 31 mg/dL — ABNORMAL HIGH (ref 6–23)
CO2: 27 mEq/L (ref 19–32)
Calcium: 9.3 mg/dL (ref 8.4–10.5)
Chloride: 107 mEq/L (ref 96–112)
Creatinine, Ser: 0.76 mg/dL (ref 0.40–1.20)
GFR: 72.65 mL/min (ref >60.00)
Glucose, Bld: 78 mg/dL (ref 70–99)
Potassium: 3.6 mEq/L (ref 3.5–5.1)
Sodium: 143 mEq/L (ref 135–145)
Total Bilirubin: 0.5 mg/dL (ref 0.2–1.2)
Total Protein: 6.5 g/dL (ref 6.0–8.3)

## 2021-11-12 LAB — HEMOGLOBIN A1C: Hgb A1c MFr Bld: 5.6 % (ref 4.6–6.5)

## 2021-11-12 LAB — LDL CHOLESTEROL, DIRECT: Direct LDL: 143 mg/dL

## 2021-11-16 ENCOUNTER — Encounter: Payer: Self-pay | Admitting: Internal Medicine

## 2021-11-16 ENCOUNTER — Ambulatory Visit: Payer: Medicare PPO | Admitting: Internal Medicine

## 2021-11-16 VITALS — BP 166/90 | HR 62 | Temp 97.4°F | Ht <= 58 in | Wt 108.6 lb

## 2021-11-16 DIAGNOSIS — Z1231 Encounter for screening mammogram for malignant neoplasm of breast: Secondary | ICD-10-CM | POA: Diagnosis not present

## 2021-11-16 DIAGNOSIS — M810 Age-related osteoporosis without current pathological fracture: Secondary | ICD-10-CM | POA: Diagnosis not present

## 2021-11-16 DIAGNOSIS — Z0000 Encounter for general adult medical examination without abnormal findings: Secondary | ICD-10-CM

## 2021-11-16 DIAGNOSIS — M5416 Radiculopathy, lumbar region: Secondary | ICD-10-CM

## 2021-11-16 MED ORDER — CELECOXIB 200 MG PO CAPS: 200.0000 mg | ORAL_CAPSULE | Freq: Two times a day (BID) | ORAL | 0 refills | Status: DC

## 2021-11-16 MED ORDER — TETANUS-DIPHTH-ACELL PERTUSSIS 5-2.5-18.5 LF-MCG/0.5 IM SUSY: 0.5000 mL | PREFILLED_SYRINGE | Freq: Once | INTRAMUSCULAR | 0 refills | Status: DC

## 2021-11-16 MED ORDER — TETANUS-DIPHTH-ACELL PERTUSSIS 5-2.5-18.5 LF-MCG/0.5 IM SUSY: 0.5000 mL | PREFILLED_SYRINGE | Freq: Once | INTRAMUSCULAR | 0 refills | Status: AC

## 2021-11-16 MED ORDER — DENOSUMAB 60 MG/ML ~~LOC~~ SOSY
60.0000 mg | PREFILLED_SYRINGE | Freq: Once | SUBCUTANEOUS | Status: AC
  Administered 2021-11-16: 60 mg via SUBCUTANEOUS

## 2021-11-16 MED ORDER — ZOSTER VAC RECOMB ADJUVANTED 50 MCG/0.5ML IM SUSR: 0.5000 mL | Freq: Once | INTRAMUSCULAR | 1 refills | Status: AC

## 2021-11-16 NOTE — Progress Notes (Signed)
Patient ID: Natalie Coleman, female    DOB: 05-16-1940  Age: 81 y.o. MRN: [id number]  The patient is here for annual preventive examination and management of other chronic and acute problems.  She is accompanied by her daughter [name].   The risk factors are reflected in the social history.  The roster of all physicians providing medical care to patient - is listed in the Snapshot section of the chart.  Activities of daily living:  The patient is 100% independent in all ADLs: dressing, toileting, feeding as well as independent mobility  Home safety : The patient has smoke detectors in the home. They wear seatbelts.  There are no firearms at home. There is no violence in the home.   There is no risks for hepatitis, STDs or HIV. There is no   history of blood transfusion. They have no travel history to infectious disease endemic areas of the world.  The patient has seen their dentist in the last six month. They have seen their eye doctor in the last year. They admit to slight hearing difficulty with regard to whispered voices and some television programs.  They have deferred audiologic testing in the last year.  They do not  have excessive sun exposure. Discussed the need for sun protection: hats, long sleeves and use of sunscreen if there is significant sun exposure.   Diet: the importance of a healthy diet is discussed. They do have a healthy diet.  The benefits of regular aerobic exercise were discussed. She is not walking or exercising dur to persistent low back pain    Depression screen: there are multilpe signs  of depression-  anhedonia, sadness/tearfullness.  Cognitive assessment: the patient manages all their financial and personal affairs and is actively engaged. They could relate day,date,year and events; recalled 2/3 objects at 3 minutes; performed clock-face test normally.  The following portions of the patient's history were reviewed and updated as appropriate: allergies, current  medications, past family history, past medical history,  past surgical history, past social history  and problem list.  Visual acuity was not assessed per patient preference since she has regular follow up with her ophthalmologist. Hearing and body mass index were assessed and reviewed.   During the course of the visit the patient was educated and counseled about appropriate screening and preventive services including : fall prevention , diabetes screening, nutrition counseling, colorectal cancer screening, and recommended immunizations.    CC: The primary encounter diagnosis was Encounter for screening mammogram for malignant neoplasm of breast. Diagnoses of Osteoporosis, postmenopausal, Encounter for preventive health examination, and Chronic lumbar radiculopathy were also pertinent to this visit.   Patient is less communicative today,; daughter  does most of the talking. Patient is tearful .  Daughter states that patient refuses to take any of the medications I have offered for management of back pain and management of depression/anxiety due to fear of addiction and adverse events    History Darcie has a past medical history of Colon polyps, GERD (gastroesophageal reflux disease), Hemorrhoids, History of blood transfusion, and Osteoporosis.   She has a past surgical history that includes Fracture surgery; Cholecystectomy (2008); ORIF tibia & fibula fractures (March 2006); ORIF ankle fracture bimalleolar (March 2006); Colonoscopy (2009); Eye surgery (Right); Cesarean section (8101,7510); and Colonoscopy with propofol (N/A, 12/10/2015).   Her family history includes Breast cancer in her cousin; Hypertension in her father, mother, and sister.She reports that she has never smoked. She has never used smokeless tobacco. She reports current  alcohol use of about 1.0 standard drink of alcohol per week. She reports that she does not use drugs.  Outpatient Medications Prior to Visit  Medication Sig  Dispense Refill   ammonium lactate (AMLACTIN) 12 % lotion Apply 1 application topically as needed for dry skin. 400 g 0   aspirin 81 MG tablet Take 81 mg by mouth daily.     Calcium Citrate 250 MG TABS Take 2 tablets by mouth 3 (three) times daily.     Coenzyme Q10 100 MG TABS Take 1 tablet by mouth daily.     denosumab (PROLIA) 60 MG/ML SOLN injection Inject 60 mg into the skin every 6 (six) months. Administer in upper arm, thigh, or abdomen     GARLIC PO Take 1 capsule by mouth daily.     Lactobacillus-Inulin (CULTURELLE DIGESTIVE HEALTH) CAPS Take 1 capsule by mouth daily. 14 capsule 0   Melatonin 5 MG TABS Take 1 tablet by mouth as needed.     Misc Natural Products (ADV TURMERIC CURCUMIN COMPLEX PO) Take 1 capsule by mouth daily.     Multiple Vitamins-Minerals (CENTRUM SILVER PO) Take 1 tablet by mouth daily.     VALERIAN ROOT PO Take 1 capsule by mouth as needed.     vitamin B-12 (CYANOCOBALAMIN) 1000 MCG tablet Take 5,000 mcg by mouth daily.      vitamin C (ASCORBIC ACID) 500 MG tablet Take 500 mg by mouth daily.     escitalopram (LEXAPRO) 5 MG tablet Take 1 tablet (5 mg total) by mouth daily. (Patient not taking: Reported on 11/16/2021) 90 tablet 1   No facility-administered medications prior to visit.    Review of Systems  Objective:  BP (!) 166/90 (BP Location: Left Arm, Patient Position: Sitting, Cuff Size: Normal)   Pulse 62   Temp (!) 97.4 F (36.3 C) (Oral)   Ht 4\' 6"  (1.372 m)   Wt 108 lb 9.6 oz (49.3 kg)   SpO2 96%   BMI 26.18 kg/m   Physical Exam  Physical Exam   Assessment & Plan:   Problem List Items Addressed This Visit     Screening for breast cancer - Primary   Relevant Orders   MM 3D SCREEN BREAST BILATERAL   Osteoporosis, postmenopausal    Prolia injection was given today       Encounter for preventive health examination    age appropriate education and counseling updated, referrals for preventative services and immunizations addressed, dietary  and smoking counseling addressed, most recent labs reviewed.  I have personally reviewed and have noted:   1) the patient's medical and social history 2) The pt's use of alcohol, tobacco, and illicit drugs 3) The patient's current medications and supplements 4) Functional ability including ADL's, fall risk, home safety risk, hearing and visual impairment 5) Diet and physical activities 6) Evidence for depression or mood disorder 7) The patient's height, weight, and BMI have been recorded in the chart   I have made referrals, and provided counseling and education based on review of the above      Chronic lumbar radiculopathy    Secondary to mild DJD of hip and SEVERE lumbar spinal stenosis with S nerve root contact with bulging disk .  She has had  MRI followed by referral to .  She has historically refused  pain medication other than tylenol.  Trial of once daily tramadol  And twice daily celebrex to be added to tylenol        I  have discontinued Makeda C. Lezama's escitalopram. I am also having her start on celecoxib and Zoster Vaccine Adjuvanted. Additionally, I am having her maintain her Multiple Vitamins-Minerals (CENTRUM SILVER PO), GARLIC PO, ascorbic acid, cyanocobalamin, aspirin, VALERIAN ROOT PO, melatonin, [employer], Coenzyme Q10, denosumab, Calcium Citrate, ammonium lactate, and Misc Natural Products (ADV TURMERIC CURCUMIN COMPLEX PO). We administered denosumab.  Meds ordered this encounter  Medications   denosumab (PROLIA) injection 60 mg    Order Specific Question:   Patient is enrolled in [facility] program for this medication and I have provided a copy of the Prolia Medication Guide and Patient Brochure.    Answer:   Yes    Order Specific Question:   I have reviewed with the patient the information in the Prolia Medication Guide and Patient Counseling Chart including the serious risks of Prolia and symptoms of each risk.    Answer:   Yes    Order  Specific Question:   I have advised the patient to seek medical attention if they have signs or symptoms of any of the serious risks.    Answer:   Yes   celecoxib (CELEBREX) 200 MG capsule    Sig: Take 1 capsule (200 mg total) by mouth 2 (two) times daily.    Dispense:  60 capsule    Refill:  0   DISCONTD: Tdap (BOOSTRIX) 5-2.5-18.5 LF-MCG/0.5 injection    Sig: Inject 0.5 mLs into the muscle once for 1 dose.    Dispense:  0.5 mL    Refill:  0   Zoster Vaccine Adjuvanted [facility]) injection    Sig: Inject 0.5 mLs into the muscle once for 1 dose.    Dispense:  1 each    Refill:  1   Tdap (BOOSTRIX) 5-2.5-18.5 LF-MCG/0.5 injection    Sig: Inject 0.5 mLs into the muscle once for 1 dose.    Dispense:  0.5 mL    Refill:  0    Medications Discontinued During This Encounter  Medication Reason   escitalopram (LEXAPRO) 5 MG tablet    Tdap ([place]) 5-2.5-18.5 LF-MCG/0.5 injection     Follow-up: No follow-ups on file.   Crecencio Mc, MD

## 2021-11-16 NOTE — Patient Instructions (Addendum)
1) I want you to take celebrex twice daily after you eat  breakfast and dinner I want you take 1000 mg tylenol twice daily as well (breakfast ) and dinner  If pain is not controlled in one week with this regimen,  call the office   so we can treat it   You have to walk for 15 minutes every day or you will become weaker  2) Exercise ,  good diet and adequate sleep are important for memory   Your annual mammogram has been ordered AND IS DUE in [place].  [place] [facility] will not allow [place] to schedule it for you,  so please  call to make your appointment [phone]     3)  The new "normal"  for  blood pressure is now 130/80  to 140/90, Please check your blood pressure once daily for the next 7 days and send  me the readings so I can determine if you need to start a medication to lower your blood pressure . [employer] makes good quality machines   Call me back in one week with your readings   4) You need to drink more water  60 ounces daily is your goal.

## 2021-11-17 NOTE — Assessment & Plan Note (Signed)
Prolia injection was given today

## 2021-11-17 NOTE — Assessment & Plan Note (Signed)
Secondary to mild DJD of hip and SEVERE lumbar spinal stenosis with S nerve root contact with bulging disk .  She has had  MRI followed by referral to [facility].  She has historically refused  pain medication other than tylenol.  Trial of once daily tramadol  And twice daily celebrex to be added to tylenol

## 2021-11-17 NOTE — Assessment & Plan Note (Signed)

## 2021-11-18 ENCOUNTER — Telehealth: Payer: Self-pay | Admitting: Internal Medicine

## 2021-11-18 NOTE — Telephone Encounter (Signed)
Pt called stating she is having pain on the left side of her back and the provider gave her medication to control the pain but the pain is still there. Pt would like to be called.

## 2021-11-18 NOTE — Telephone Encounter (Signed)
Spoke with pt and she was crying in pain. Pt was advised that if she is in that much pain she needs to be evaluated tonight at the ED. Pt stated that she does not want to go to the ED because she thinks that she was just not taking enough of her medication. I asked pt how she was taking it and she stated that she wasn't sure because her daughter gives it to her and her daughter is at work. Pt was advised that she would need to find out from her daughter how she is taking her medication and give [place] a call back tomorrow. Pt was advised again that if the pain gets any worse before tomorrow that she will need to be seen at the ED. Pt gave a verbal understanding.

## 2021-11-19 ENCOUNTER — Telehealth: Payer: Self-pay

## 2021-11-19 NOTE — Telephone Encounter (Signed)
Pt is returning a call from [place]

## 2021-11-19 NOTE — Telephone Encounter (Signed)
Patient states the medication we gave her for pain in her back and on her left side is not working, and she is really hurting this morning.  Patient states she does not know the name of the medication.  Patient states she is supposed to take this medication at breakfast and after dinner.  Patient would like to know if she can take Tylenol or something else in between those times to help with the pain.  Please call patient to let her know as soon as you can. (Patient was in tears because of the pain)

## 2021-11-19 NOTE — Telephone Encounter (Signed)
Patient states she is following up on her previous message.  Patient states she is not as bad as she was yesterday, but she wants to know if she can take the extra medication.

## 2021-11-19 NOTE — Telephone Encounter (Signed)
Pt is talking about the celebrex that she is taking two times daily. She is wanting to know if she can tylenol between the celebrex doses.

## 2021-11-23 MED ORDER — TRAMADOL HCL 50 MG PO TABS: 50.0000 mg | ORAL_TABLET | Freq: Two times a day (BID) | ORAL | 0 refills | Status: DC | PRN

## 2021-11-23 NOTE — Telephone Encounter (Signed)
LMTCB with pt's daughter Marcie Bal.

## 2021-11-23 NOTE — Telephone Encounter (Signed)
Celebrex twice daily and 1000 mg of tylenol twice daily. Pt stated that she has tried this for one week and is still in pain. Medication was verified by pt's daughter.

## 2021-11-23 NOTE — Telephone Encounter (Signed)
I have called in tramadol to add to current regimen .  Not more than 2 per day to start

## 2021-11-23 NOTE — Telephone Encounter (Signed)
Pt is aware that Tramadol has been called in. Pt also gave permission for [place] to reach out to pt's daughter to let her know as well.

## 2021-11-23 NOTE — Telephone Encounter (Signed)
Pt's daughter would like to know if the Tramadol is in addition to the Celebrex and the Tylenol

## 2021-11-23 NOTE — Telephone Encounter (Signed)
Marcie Bal , patient's daughter returned office phone call.

## 2021-11-24 NOTE — Telephone Encounter (Signed)
Patient's daughter called and note from Dr Derrel Nip was read about taking the Tramadol only 2x a day along with the regimen Dr Derrel Nip has set up.

## 2021-11-24 NOTE — Telephone Encounter (Signed)
noted 

## 2021-12-09 ENCOUNTER — Telehealth: Payer: Self-pay | Admitting: Internal Medicine

## 2021-12-09 ENCOUNTER — Other Ambulatory Visit: Payer: Self-pay | Admitting: Internal Medicine

## 2021-12-09 NOTE — Telephone Encounter (Signed)
Pt need a refill on tramadol sent to [facility]

## 2021-12-10 NOTE — Telephone Encounter (Signed)
Refilled: 11/23/2021 Last OV: 11/16/2021 Next OV: not scheduled

## 2021-12-11 MED ORDER — TRAMADOL HCL 50 MG PO TABS: 50.0000 mg | ORAL_TABLET | Freq: Two times a day (BID) | ORAL | 2 refills | Status: DC | PRN

## 2021-12-14 ENCOUNTER — Telehealth: Payer: Self-pay

## 2021-12-14 MED ORDER — CELECOXIB 200 MG PO CAPS: 200.0000 mg | ORAL_CAPSULE | Freq: Two times a day (BID) | ORAL | 5 refills | Status: DC

## 2021-12-14 NOTE — Telephone Encounter (Signed)
Patient's daughter, Marcie Bal, states patient is out of her celecoxib (CELEBREX) 200 MG capsule.  Marcie Bal states she has seen a positive difference in the last week or week and a half, so she thinks this medication is working and would like to request a refill.  Marcie Bal states patient has been taking Natures B[employer] anxiety and stress relief and it has helped her to calm down.  Marcie Bal states patient's preferred pharmacy is [facility] on [address].

## 2021-12-14 NOTE — Telephone Encounter (Signed)
Pt's daughter is aware 

## 2021-12-14 NOTE — Telephone Encounter (Signed)
Medication has been refilled.

## 2021-12-15 DIAGNOSIS — M5417 Radiculopathy, lumbosacral region: Secondary | ICD-10-CM | POA: Diagnosis not present

## 2021-12-15 DIAGNOSIS — M9905 Segmental and somatic dysfunction of pelvic region: Secondary | ICD-10-CM | POA: Diagnosis not present

## 2021-12-15 DIAGNOSIS — M9903 Segmental and somatic dysfunction of lumbar region: Secondary | ICD-10-CM | POA: Diagnosis not present

## 2021-12-15 DIAGNOSIS — M4306 Spondylolysis, lumbar region: Secondary | ICD-10-CM | POA: Diagnosis not present

## 2021-12-16 DIAGNOSIS — M5417 Radiculopathy, lumbosacral region: Secondary | ICD-10-CM | POA: Diagnosis not present

## 2021-12-16 DIAGNOSIS — M9903 Segmental and somatic dysfunction of lumbar region: Secondary | ICD-10-CM | POA: Diagnosis not present

## 2021-12-16 DIAGNOSIS — M4306 Spondylolysis, lumbar region: Secondary | ICD-10-CM | POA: Diagnosis not present

## 2021-12-16 DIAGNOSIS — M9905 Segmental and somatic dysfunction of pelvic region: Secondary | ICD-10-CM | POA: Diagnosis not present

## 2021-12-17 DIAGNOSIS — M5417 Radiculopathy, lumbosacral region: Secondary | ICD-10-CM | POA: Diagnosis not present

## 2021-12-17 DIAGNOSIS — M9903 Segmental and somatic dysfunction of lumbar region: Secondary | ICD-10-CM | POA: Diagnosis not present

## 2021-12-17 DIAGNOSIS — M9905 Segmental and somatic dysfunction of pelvic region: Secondary | ICD-10-CM | POA: Diagnosis not present

## 2021-12-17 DIAGNOSIS — M4306 Spondylolysis, lumbar region: Secondary | ICD-10-CM | POA: Diagnosis not present

## 2021-12-21 ENCOUNTER — Ambulatory Visit (INDEPENDENT_AMBULATORY_CARE_PROVIDER_SITE_OTHER): Payer: Medicare PPO | Admitting: Internal Medicine

## 2021-12-21 ENCOUNTER — Encounter: Payer: Self-pay | Admitting: Internal Medicine

## 2021-12-21 VITALS — BP 150/82 | HR 78 | Temp 98.2°F | Resp 18 | Ht <= 58 in | Wt 108.7 lb

## 2021-12-21 DIAGNOSIS — K219 Gastro-esophageal reflux disease without esophagitis: Secondary | ICD-10-CM | POA: Diagnosis not present

## 2021-12-21 DIAGNOSIS — R030 Elevated blood-pressure reading, without diagnosis of hypertension: Secondary | ICD-10-CM

## 2021-12-21 DIAGNOSIS — Z86010 Personal history of colonic polyps: Secondary | ICD-10-CM

## 2021-12-21 DIAGNOSIS — M9903 Segmental and somatic dysfunction of lumbar region: Secondary | ICD-10-CM | POA: Diagnosis not present

## 2021-12-21 DIAGNOSIS — M9905 Segmental and somatic dysfunction of pelvic region: Secondary | ICD-10-CM | POA: Diagnosis not present

## 2021-12-21 DIAGNOSIS — K625 Hemorrhage of anus and rectum: Secondary | ICD-10-CM | POA: Insufficient documentation

## 2021-12-21 DIAGNOSIS — M4306 Spondylolysis, lumbar region: Secondary | ICD-10-CM | POA: Diagnosis not present

## 2021-12-21 DIAGNOSIS — M5417 Radiculopathy, lumbosacral region: Secondary | ICD-10-CM | POA: Diagnosis not present

## 2021-12-21 DIAGNOSIS — F411 Generalized anxiety disorder: Secondary | ICD-10-CM

## 2021-12-21 NOTE — Patient Instructions (Signed)
Benefiber - daily.  Mix in your drink in the morning.

## 2021-12-21 NOTE — Progress Notes (Unsigned)
Patient ID: Natalie Coleman, female   DOB: [birth year], 81 y.o.   MRN: [id number]   Subjective:    Patient ID: Natalie Coleman, female    DOB: [birth year], 81 y.o.   MRN: [id number]   Patient here for work in appt.    HPI Work in - noticed of BRB when wiping.  States noticed several days ago - BRB when wiping - after a bowel movement.  No blood today.  Does have some issues with getting her bowels to move.  No nausea or vomiting.  No abdominal pain.  Eating.  Had an episode previously - evaluated 06/24/21 - diagnosed with hemorrhoids.  No further bleeding until a few days ago.  Increased stress related to above.  Discussed.     Past Medical History:  Diagnosis Date   Colon polyps    GERD (gastroesophageal reflux disease)    Hemorrhoids    History of blood transfusion    Osteoporosis    Past Surgical History:  Procedure Laterality Date   CESAREAN SECTION  [id number],9892   CHOLECYSTECTOMY  2008   COLONOSCOPY  2009   COLONOSCOPY WITH PROPOFOL N/A 12/10/2015   Procedure: COLONOSCOPY WITH PROPOFOL;  Surgeon: Earline Mayotte, MD;  Location: [facility] ENDOSCOPY;  Service: Endoscopy;  Laterality: N/A;   EYE SURGERY Right    FRACTURE SURGERY     right tibia   ORIF ANKLE FRACTURE BIMALLEOLAR  March 2006   ORIF TIBIA & FIBULA FRACTURES  March 2006   Family History  Problem Relation Age of Onset   Hypertension Father    Hypertension Mother    Hypertension Sister    Breast cancer Cousin    Cancer Neg Hx    Social History   Socioeconomic History   Marital status: Divorced    Spouse name: Not on file   Number of children: Not on file   Years of education: Not on file   Highest education level: Not on file  Occupational History   Not on file  Tobacco Use   Smoking status: Never   Smokeless tobacco: Never  Substance and Sexual Activity   Alcohol use: Yes    Alcohol/week: 1.0 standard drink of alcohol    Types: 1 Glasses of wine per week    Comment: ocassional   Drug use: No    Sexual activity: Never  Other Topics Concern   Not on file  Social History Narrative   Not on file   Social Determinants of Health   Financial Resource Strain: Low Risk  (06/29/2021)   Overall Financial Resource Strain (CARDIA)    Difficulty of Paying Living Expenses: Not hard at all  Food Insecurity: No Food Insecurity (06/29/2021)   Hunger Vital Sign    Worried About Running Out of Food in the Last Year: Never true    Ran Out of Food in the Last Year: Never true  Transportation Needs: No Transportation Needs (06/29/2021)   PRAPARE - Administrator, Civil Service (Medical): No    Lack of Transportation (Non-Medical): No  Physical Activity: Unknown (05/03/2017)   Exercise Vital Sign    Days of Exercise per Week: 0 days    Minutes of Exercise per Session: Not on file  Stress: No Stress Concern Present (06/29/2021)   [employer] of Occupational Health - Occupational Stress Questionnaire    Feeling of Stress : Not at all  Social Connections: Unknown (06/29/2021)   Social Connection and Isolation Panel [NHANES]    Frequency of  Communication with Friends and Family: Once a week    Frequency of Social Gatherings with Friends and Family: More than three times a week    Attends Religious Services: Not on Marketing executive or Organizations: Not on file    Attends Banker Meetings: Not on file    Marital Status: Not on file     Review of Systems  Constitutional:  Negative for appetite change and unexpected weight change.  HENT:  Negative for congestion and sinus pressure.   Respiratory:  Negative for cough, chest tightness and shortness of breath.   Cardiovascular:  Negative for chest pain, palpitations and leg swelling.  Gastrointestinal:  Negative for abdominal pain, diarrhea, nausea and vomiting.       BRBPR as outlined.   Genitourinary:  Negative for difficulty urinating and dysuria.  Musculoskeletal:  Negative for joint swelling and  myalgias.  Skin:  Negative for color change and rash.  Neurological:  Negative for dizziness and headaches.  Psychiatric/Behavioral:  Negative for agitation and dysphoric mood.        Objective:     BP (!) 150/82 (BP Location: Left Arm, Patient Position: Sitting, Cuff Size: Small)   Pulse 78   Temp 98.2 F (36.8 C) (Temporal)   Resp 18   Ht 4\' 8"  (1.422 m)   Wt 108 lb 11.2 oz (49.3 kg)   SpO2 98%   BMI 24.37 kg/m  Wt Readings from Last 3 Encounters:  12/21/21 108 lb 11.2 oz (49.3 kg)  11/16/21 108 lb 9.6 oz (49.3 kg)  06/29/21 109 lb 12.8 oz (49.8 kg)    Physical Exam Vitals reviewed.  Constitutional:      General: She is not in acute distress.    Appearance: Normal appearance.  HENT:     Head: Normocephalic and atraumatic.     Mouth/Throat:     Mouth: Mucous membranes are moist.     Pharynx: No posterior oropharyngeal erythema.  Eyes:     General: No scleral icterus.       Right eye: No discharge.        Left eye: No discharge.     Conjunctiva/sclera: Conjunctivae normal.  Neck:     Thyroid: No thyromegaly.  Cardiovascular:     Rate and Rhythm: Normal rate and regular rhythm.  Pulmonary:     Effort: No respiratory distress.     Breath sounds: Normal breath sounds. No wheezing.  Abdominal:     General: Bowel sounds are normal.     Palpations: Abdomen is soft.     Tenderness: There is no abdominal tenderness.  Genitourinary:    Comments: Rectal - no visible hemorrhoids.  No palpable masses. Heme negative on exam.  Musculoskeletal:        General: No swelling or tenderness.     Cervical back: Neck supple. No tenderness.  Lymphadenopathy:     Cervical: No cervical adenopathy.  Skin:    Findings: No erythema or rash.  Neurological:     Mental Status: She is alert.  Psychiatric:        Mood and Affect: Mood normal.        Behavior: Behavior normal.      Outpatient Encounter Medications as of 12/21/2021  Medication Sig   ammonium lactate (AMLACTIN) 12  % lotion Apply 1 application topically as needed for dry skin.   aspirin 81 MG tablet Take 81 mg by mouth daily.   Calcium Citrate 250 MG TABS  Take 2 tablets by mouth 3 (three) times daily.   celecoxib (CELEBREX) 200 MG capsule Take 1 capsule (200 mg total) by mouth 2 (two) times daily.   Coenzyme Q10 100 MG TABS Take 1 tablet by mouth daily.   denosumab (PROLIA) 60 MG/ML SOLN injection Inject 60 mg into the skin every 6 (six) months. Administer in upper arm, thigh, or abdomen   GARLIC PO Take 1 capsule by mouth daily.   Lactobacillus-Inulin (CULTURELLE DIGESTIVE HEALTH) CAPS Take 1 capsule by mouth daily.   Melatonin 5 MG TABS Take 1 tablet by mouth as needed.   Misc Natural Products (ADV TURMERIC CURCUMIN COMPLEX PO) Take 1 capsule by mouth daily.   Multiple Vitamins-Minerals (CENTRUM SILVER PO) Take 1 tablet by mouth daily.   traMADol (ULTRAM) 50 MG tablet Take 1 tablet (50 mg total) by mouth every 12 (twelve) hours as needed for moderate pain.   VALERIAN ROOT PO Take 1 capsule by mouth as needed.   vitamin B-12 (CYANOCOBALAMIN) 1000 MCG tablet Take 5,000 mcg by mouth daily.    vitamin C (ASCORBIC ACID) 500 MG tablet Take 500 mg by mouth daily.   No facility-administered encounter medications on file as of 12/21/2021.     Lab Results  Component Value Date   WBC 7.9 12/21/2021   HGB 11.9 (L) 12/21/2021   HCT 35.0 (L) 12/21/2021   PLT 291.0 12/21/2021   GLUCOSE 78 11/12/2021   CHOL 227 (H) 11/12/2021   TRIG 95.0 11/12/2021   HDL 62.20 11/12/2021   LDLDIRECT 143.0 11/12/2021   LDLCALC 146 (H) 11/12/2021   ALT 14 11/12/2021   AST 17 11/12/2021   NA 143 11/12/2021   K 3.6 11/12/2021   CL 107 11/12/2021   CREATININE 0.76 11/12/2021   BUN 31 (H) 11/12/2021   CO2 27 11/12/2021   TSH 1.66 11/12/2021   HGBA1C 5.6 11/12/2021    MM 3D SCREEN BREAST BILATERAL  Result Date: 09/05/2020 CLINICAL DATA:  Screening. EXAM: DIGITAL SCREENING BILATERAL MAMMOGRAM WITH TOMOSYNTHESIS AND CAD  TECHNIQUE: Bilateral screening digital craniocaudal and mediolateral oblique mammograms were obtained. Bilateral screening digital breast tomosynthesis was performed. The images were evaluated with computer-aided detection. COMPARISON:  Previous exam(s). ACR Breast Density Category c: The breast tissue is heterogeneously dense, which may obscure small masses. FINDINGS: There are no findings suspicious for malignancy. IMPRESSION: No mammographic evidence of malignancy. A result letter of this screening mammogram will be mailed directly to the patient. RECOMMENDATION: Screening mammogram in one year. (Code:SM-B-01Y) BI-RADS CATEGORY  1: Negative. Electronically Signed   By: Amie Portland M.D.   On: 09/05/2020 14:07  DEXAScan  Result Date: 09/05/2020 EXAM: DUAL X-RAY ABSORPTIOMETRY (DXA) FOR BONE MINERAL DENSITY IMPRESSION: Your patient Averiana Clouatre completed a BMD test on 09/04/2020 using the [employer] iDXA DXA System (software version: 14.10) manufactured by [employer]. The following summarizes the results of our evaluation. Technologist: SCE PATIENT BIOGRAPHICAL: Name: Kyri, Shader Patient ID: [id number] Birth Date: 08-06-1940 Height: 53.5 in. Gender: Female Exam Date: 09/04/2020 Weight: 103.3 lbs. Indications: Advanced Age, Early Menopause, Height Loss, High Risk Meds, History of Fracture (Adult), History of Osteoporosis, Hysterectomy, Postmenopausal Fractures: Tibia/Fibula Treatments: Calcium, Multi-Vitamin, Prolia DENSITOMETRY RESULTS: Site         Region     Measured Date Measured Age WHO Classification Young Adult T-score BMD         %Change vs. Previous Significant Change (*) DualFemur Neck Right 09/04/2020 81.8 Osteoporosis -2.5 0.693 g/cm2 -2.9% - DualFemur Neck  Right 01/12/2016 77.2 Osteopenia -2.3 0.714 g/cm2 - - DualFemur Total Mean 09/04/2020 81.8 Osteopenia -2.0 0.753 g/cm2 6.2% Yes DualFemur Total Mean 01/12/2016 77.2 Osteopenia -2.4 0.709 g/cm2 - - Left Forearm Radius 33%  09/04/2020 81.8 Osteoporosis -2.8 0.635 g/cm2 - - ASSESSMENT: The BMD measured at Forearm Radius 33% is 0.635 g/cm2 with a T-score of -2.8. This patient is considered osteoporotic according to World Health Organization [facility](WHO) criteria. The scan quality is good. Lumbar spine was not utilized due to advanced degenerative changes. Compared with prior study, there has been a significant increase in the total hip. World Science writerHealth Organization [facility](WHO) criteria for post-menopausal, Caucasian Women: Normal:                   T-score at or above -1 SD Osteopenia/low bone mass: T-score between -1 and -2.5 SD Osteoporosis:             T-score at or below -2.5 SD RECOMMENDATIONS: 1. All patients should optimize calcium and vitamin D intake. 2. Consider FDA-approved medical therapies in postmenopausal women and men aged 83 years and older, based on the following: a. A hip or vertebral(clinical or morphometric) fracture b. T-score < -2.5 at the femoral neck or spine after appropriate evaluation to exclude secondary causes c. Low bone mass (T-score between -1.0 and -2.5 at the femoral neck or spine) and a 10-year probability of a hip fracture > 3% or a 10-year probability of a major osteoporosis-related fracture > 20% based on the [place]-adapted WHO algorithm 3. Clinician judgment and/or patient preferences may indicate treatment for people with 10-year fracture probabilities above or below these levels FOLLOW-UP: People with diagnosed cases of osteoporosis or at high risk for fracture should have regular bone mineral density tests. For patients eligible for Medicare, routine testing is allowed once every 2 years. The testing frequency can be increased to one year for patients who have rapidly progressing disease, those who are receiving or discontinuing medical therapy to restore bone mass, or have additional risk factors. I have reviewed this report, and agree with the above findings. Mark A. Tyron RussellBoles, M.D. [facility]  Electronically Signed   By: Ulyses SouthwardMark  Boles M.D.   On: 09/05/2020 08:12       Assessment & Plan:   Problem List Items Addressed This Visit     Anxiety state    Increased stress related.  Discussed.  Overall stable.       Elevated blood pressure reading    Blood pressure elevated.  She is stressed regarding previous noticed of BRBPR.  Discussed.  Have her spot check her pressure.  Get her back in with PCP for f/u and recheck of blood pressure.  Hold starting medication today.       GERD (gastroesophageal reflux disease)    No upper symptoms reported.        History of colonic polyps    Last colonoscopy 2017 - clear.  BRB recently as outlined.  Check cbc.  Consider further w/up given recent episode as outlined.       Rectal bleeding - Primary    Noticed bright red blood with wiping as outlined.  No blood today.  Heme negative on exam.  Discussed possible etiologies, including hemorrhoids, tear/fissure, etc.  Colonoscopy 11/2015 - ok.  Discussed importance of keeping bowels moving.  Discussed staying hydrated and starting benefiber.  Check cbc.  Follow.  Consider referral back for f/u colon - monitor for further bleeding.  Relevant Orders   CBC with Differential/Platelet (Completed)     Einar Pheasant, MD

## 2021-12-22 LAB — CBC WITH DIFFERENTIAL/PLATELET
Basophils Absolute: 0.1 10*3/uL (ref 0.0–0.1)
Basophils Relative: 0.7 % (ref 0.0–3.0)
Eosinophils Absolute: 0.1 10*3/uL (ref 0.0–0.7)
Eosinophils Relative: 1.4 % (ref 0.0–5.0)
HCT: 35 % — ABNORMAL LOW (ref 36.0–46.0)
Hemoglobin: 11.9 g/dL — ABNORMAL LOW (ref 12.0–15.0)
Lymphocytes Relative: 20.7 % (ref 12.0–46.0)
Lymphs Abs: 1.6 10*3/uL (ref 0.7–4.0)
MCHC: 34.1 g/dL (ref 30.0–36.0)
MCV: 93.2 fl (ref 78.0–100.0)
Monocytes Absolute: 0.8 10*3/uL (ref 0.1–1.0)
Monocytes Relative: 9.6 % (ref 3.0–12.0)
Neutro Abs: 5.4 10*3/uL (ref 1.4–7.7)
Neutrophils Relative %: 67.6 % (ref 43.0–77.0)
Platelets: 291 10*3/uL (ref 150.0–400.0)
RBC: 3.76 Mil/uL — ABNORMAL LOW (ref 3.87–5.11)
RDW: 14.2 % (ref 11.5–15.5)
WBC: 7.9 10*3/uL (ref 4.0–10.5)

## 2021-12-23 ENCOUNTER — Encounter: Payer: Self-pay | Admitting: Internal Medicine

## 2021-12-23 ENCOUNTER — Other Ambulatory Visit: Payer: Self-pay | Admitting: Internal Medicine

## 2021-12-23 DIAGNOSIS — M4306 Spondylolysis, lumbar region: Secondary | ICD-10-CM | POA: Diagnosis not present

## 2021-12-23 DIAGNOSIS — R030 Elevated blood-pressure reading, without diagnosis of hypertension: Secondary | ICD-10-CM | POA: Insufficient documentation

## 2021-12-23 DIAGNOSIS — M5417 Radiculopathy, lumbosacral region: Secondary | ICD-10-CM | POA: Diagnosis not present

## 2021-12-23 DIAGNOSIS — M9905 Segmental and somatic dysfunction of pelvic region: Secondary | ICD-10-CM | POA: Diagnosis not present

## 2021-12-23 DIAGNOSIS — D649 Anemia, unspecified: Secondary | ICD-10-CM

## 2021-12-23 DIAGNOSIS — I10 Essential (primary) hypertension: Secondary | ICD-10-CM | POA: Insufficient documentation

## 2021-12-23 DIAGNOSIS — M9903 Segmental and somatic dysfunction of lumbar region: Secondary | ICD-10-CM | POA: Diagnosis not present

## 2021-12-23 NOTE — Progress Notes (Signed)
Order placed for f/u labs.  

## 2021-12-23 NOTE — Assessment & Plan Note (Signed)
No upper symptoms reported.   

## 2021-12-23 NOTE — Assessment & Plan Note (Signed)
Noticed bright red blood with wiping as outlined.  No blood today.  Heme negative on exam.  Discussed possible etiologies, including hemorrhoids, tear/fissure, etc.  Colonoscopy 11/2015 - ok.  Discussed importance of keeping bowels moving.  Discussed staying hydrated and starting benefiber.  Check cbc.  Follow.  Consider referral back for f/u colon - monitor for further bleeding.

## 2021-12-23 NOTE — Assessment & Plan Note (Signed)
Last colonoscopy 2017 - clear.  BRB recently as outlined.  Check cbc.  Consider further w/up given recent episode as outlined.

## 2021-12-23 NOTE — Assessment & Plan Note (Signed)
Blood pressure elevated.  She is stressed regarding previous noticed of BRBPR.  Discussed.  Have her spot check her pressure.  Get her back in with PCP for f/u and recheck of blood pressure.  Hold starting medication today.

## 2021-12-23 NOTE — Assessment & Plan Note (Signed)
Increased stress related.  Discussed.  Overall stable.

## 2021-12-24 ENCOUNTER — Telehealth: Payer: Self-pay

## 2021-12-24 NOTE — Telephone Encounter (Signed)
Spoke with pt and she stated that she has been using the Benefiber daily and has only had a really small bowel movement. Pt stated that she not having any abdominal pain or discomfort. Pt has scheduled an appointment for follow up with Dr. Darrick Huntsman on Monday. Is there anything else that pt should try with the Benefiber over the weekend?

## 2021-12-24 NOTE — Telephone Encounter (Signed)
Has she tried miralax.  Can see if this works any better than benefiber.

## 2021-12-24 NOTE — Telephone Encounter (Signed)
Patient states she saw Dr. Dale Chauncey last week.  Patient states she has been doing everything they discussed, but she is not getting better.  Patient states she has still been unable to have a bowel movement and she does not know what to do.  Patient states she would like for [place] to call her.  Patient has been scheduled to see Dr. Duncan Dull on Monday (12/28/2021).

## 2021-12-24 NOTE — Telephone Encounter (Signed)
Attempted to call pt. No answer no voicemail pick up this time.

## 2021-12-25 NOTE — Telephone Encounter (Signed)
Pt called back and was advised to start using the miralax once daily. Pt gave a verbal understanding.

## 2021-12-28 ENCOUNTER — Encounter: Payer: Self-pay | Admitting: Internal Medicine

## 2021-12-28 ENCOUNTER — Ambulatory Visit (INDEPENDENT_AMBULATORY_CARE_PROVIDER_SITE_OTHER): Payer: Medicare PPO | Admitting: Internal Medicine

## 2021-12-28 ENCOUNTER — Telehealth: Payer: Self-pay

## 2021-12-28 VITALS — BP 176/88 | HR 72 | Temp 97.5°F | Ht <= 58 in | Wt 106.6 lb

## 2021-12-28 DIAGNOSIS — G3184 Mild cognitive impairment, so stated: Secondary | ICD-10-CM | POA: Insufficient documentation

## 2021-12-28 DIAGNOSIS — F0280 Dementia in other diseases classified elsewhere without behavioral disturbance: Secondary | ICD-10-CM | POA: Insufficient documentation

## 2021-12-28 DIAGNOSIS — R4189 Other symptoms and signs involving cognitive functions and awareness: Secondary | ICD-10-CM | POA: Diagnosis not present

## 2021-12-28 DIAGNOSIS — K649 Unspecified hemorrhoids: Secondary | ICD-10-CM

## 2021-12-28 DIAGNOSIS — M5416 Radiculopathy, lumbar region: Secondary | ICD-10-CM

## 2021-12-28 DIAGNOSIS — I10 Essential (primary) hypertension: Secondary | ICD-10-CM

## 2021-12-28 MED ORDER — TRAMADOL HCL 50 MG PO TABS: 50.0000 mg | ORAL_TABLET | Freq: Four times a day (QID) | ORAL | 3 refills | Status: DC | PRN

## 2021-12-28 MED ORDER — AMLODIPINE BESYLATE 2.5 MG PO TABS: 2.5000 mg | ORAL_TABLET | Freq: Every day | ORAL | 1 refills | Status: DC

## 2021-12-28 NOTE — Telephone Encounter (Signed)
Danford Bad called from [facility] with question regarding prescription we sent for traMADol (ULTRAM) 50 MG tablet.  Please call.

## 2021-12-28 NOTE — Progress Notes (Signed)
Subjective:  Patient ID: Natalie Coleman, female    DOB: 03/09/1940  Age: 81 y.o. MRN: [id number]  CC: The primary encounter diagnosis was Chronic lumbar radiculopathy. Diagnoses of Essential hypertension, Bleeding hemorrhoid, and Pseudodementia were also pertinent to this visit.   HPI Natalie Coleman presents for follow up on multiple issues.  She is accompanied by her daughter Natalie Coleman.   Chief Complaint  Patient presents with   Constipation   1) Back pain:  now using celebrex and tramadol,  but "it is not working."  Taking celebrex and tramadol twice daily. Daughter Natalie Coleman says she has "good days and bad days." Cannot quantify whether the medication has helped , even transiently .  Seeing a Landchiropractor. Per daughter , she is screaming out in pain much less with current regimen addressing her anxiety    2) Depression ; taking some natural anti anxiety remedy given to her by a friend .  Daughter feels that she is more  calm.  But is aggravated and agitated by world events   3) Constipation: BMs are small caliber and hard . Moves them once a week   4_ Mild cognitive impairment :  she remains confused about her mediation.  Countenance is morose.  Started during the 2020 COVID enforced isolation .  Not walking for 15 minutes unless she is urged     MMSE:      Outpatient Medications Prior to Visit  Medication Sig Dispense Refill   ammonium lactate (AMLACTIN) 12 % lotion Apply 1 application topically as needed for dry skin. 400 g 0   aspirin 81 MG tablet Take 81 mg by mouth daily.     Calcium Citrate 250 MG TABS Take 2 tablets by mouth 3 (three) times daily.     celecoxib (CELEBREX) 200 MG capsule Take 1 capsule (200 mg total) by mouth 2 (two) times daily. 60 capsule 5   Coenzyme Q10 100 MG TABS Take 1 tablet by mouth daily.     denosumab (PROLIA) 60 MG/ML SOLN injection Inject 60 mg into the skin every 6 (six) months. Administer in upper arm, thigh, or abdomen     GARLIC PO Take 1  capsule by mouth daily.     Lactobacillus-Inulin (CULTURELLE DIGESTIVE HEALTH) CAPS Take 1 capsule by mouth daily. 14 capsule 0   Melatonin 5 MG TABS Take 1 tablet by mouth as needed.     Misc Natural Products (ADV TURMERIC CURCUMIN COMPLEX PO) Take 1 capsule by mouth daily.     Multiple Vitamins-Minerals (CENTRUM SILVER PO) Take 1 tablet by mouth daily.     VALERIAN ROOT PO Take 1 capsule by mouth as needed.     vitamin B-12 (CYANOCOBALAMIN) 1000 MCG tablet Take 5,000 mcg by mouth daily.      vitamin C (ASCORBIC ACID) 500 MG tablet Take 500 mg by mouth daily.     traMADol (ULTRAM) 50 MG tablet Take 1 tablet (50 mg total) by mouth every 12 (twelve) hours as needed for moderate pain. 60 tablet 2   No facility-administered medications prior to visit.    Review of Systems;  Patient denies headache, fevers, malaise, unintentional weight loss, skin rash, eye pain, sinus congestion and sinus pain, sore throat, dysphagia,  hemoptysis , cough, dyspnea, wheezing, chest pain, palpitations, orthopnea, edema, abdominal pain, nausea, melena, diarrhea, constipation, flank pain, dysuria, hematuria, urinary  Frequency, nocturia, numbness, tingling, seizures,  Focal weakness, Loss of consciousness,  Tremor, insomnia, depression, anxiety, and suicidal ideation.  Objective:  BP (!) 176/88 (BP Location: Left Arm, Patient Position: Sitting, Cuff Size: Normal)   Pulse 72   Temp (!) 97.5 F (36.4 C) (Oral)   Ht 4\' 8"  (1.422 m)   Wt 106 lb 9.6 oz (48.4 kg)   SpO2 96%   BMI 23.90 kg/m   BP Readings from Last 3 Encounters:  12/28/21 (!) 176/88  12/21/21 (!) 150/82  11/16/21 (!) 166/90    Wt Readings from Last 3 Encounters:  12/28/21 106 lb 9.6 oz (48.4 kg)  12/21/21 108 lb 11.2 oz (49.3 kg)  11/16/21 108 lb 9.6 oz (49.3 kg)    General appearance: alert, cooperative and appears stated age Ears: normal TM's and external ear canals both ears Throat: lips, mucosa, and tongue normal; teeth and gums  normal Neck: no adenopathy, no carotid bruit, supple, symmetrical, trachea midline and thyroid not enlarged, symmetric, no tenderness/mass/nodules Back: symmetric, no curvature. ROM normal. No CVA tenderness. Lungs: clear to auscultation bilaterally Heart: regular rate and rhythm, S1, S2 normal, no murmur, click, rub or gallop Abdomen: soft, non-tender; bowel sounds normal; no masses,  no organomegaly Pulses: 2+ and symmetric Skin: Skin color, texture, turgor normal. No rashes or lesions Lymph nodes: Cervical, supraclavicular, and axillary nodes normal. Neuro:  awake and interactive with normal mood and affect. Higher cortical functions are normal. Speech is clear without word-finding difficulty or dysarthria. Extraocular movements are intact. Visual fields of both eyes are grossly intact. Sensation to light touch is grossly intact bilaterally of upper and lower extremities. Motor examination shows 4+/5 symmetric hand grip and upper extremity and 5/5 lower extremity strength. There is no pronation or drift. Gait is non-ataxic   Lab Results  Component Value Date   HGBA1C 5.6 11/12/2021    Lab Results  Component Value Date   CREATININE 0.76 11/12/2021   CREATININE 0.82 08/15/2020   CREATININE 0.66 06/24/2020    Lab Results  Component Value Date   WBC 7.9 12/21/2021   HGB 11.9 (L) 12/21/2021   HCT 35.0 (L) 12/21/2021   PLT 291.0 12/21/2021   GLUCOSE 78 11/12/2021   CHOL 227 (H) 11/12/2021   TRIG 95.0 11/12/2021   HDL 62.20 11/12/2021   LDLDIRECT 143.0 11/12/2021   LDLCALC 146 (H) 11/12/2021   ALT 14 11/12/2021   AST 17 11/12/2021   NA 143 11/12/2021   K 3.6 11/12/2021   CL 107 11/12/2021   CREATININE 0.76 11/12/2021   BUN 31 (H) 11/12/2021   CO2 27 11/12/2021   TSH 1.66 11/12/2021   HGBA1C 5.6 11/12/2021    MM 3D SCREEN BREAST BILATERAL  Result Date: 09/05/2020 CLINICAL DATA:  Screening. EXAM: DIGITAL SCREENING BILATERAL MAMMOGRAM WITH TOMOSYNTHESIS AND CAD TECHNIQUE:  Bilateral screening digital craniocaudal and mediolateral oblique mammograms were obtained. Bilateral screening digital breast tomosynthesis was performed. The images were evaluated with computer-aided detection. COMPARISON:  Previous exam(s). ACR Breast Density Category c: The breast tissue is heterogeneously dense, which may obscure small masses. FINDINGS: There are no findings suspicious for malignancy. IMPRESSION: No mammographic evidence of malignancy. A result letter of this screening mammogram will be mailed directly to the patient. RECOMMENDATION: Screening mammogram in one year. (Code:SM-B-01Y) BI-RADS CATEGORY  1: Negative. Electronically Signed   By: 09/07/2020 M.D.   On: 09/05/2020 14:07  DEXAScan  Result Date: 09/05/2020 EXAM: DUAL X-RAY ABSORPTIOMETRY (DXA) FOR BONE MINERAL DENSITY IMPRESSION: Your patient Melenda Bielak completed a BMD test on 09/04/2020 using the Lunar iDXA DXA System (software version: 14.10) manufactured by  [employer] engineer. The following summarizes the results of our evaluation. Technologist: SCE PATIENT BIOGRAPHICAL: Name: Phoebe, Marter Patient ID: [id number] Birth Date: Jan 23, 1941 Height: 53.5 in. Gender: Female Exam Date: 09/04/2020 Weight: 103.3 lbs. Indications: Advanced Age, Early Menopause, Height Loss, High Risk Meds, History of Fracture (Adult), History of Osteoporosis, Hysterectomy, Postmenopausal Fractures: Tibia/Fibula Treatments: Calcium, Multi-Vitamin, Prolia DENSITOMETRY RESULTS: Site         Region     Measured Date Measured Age WHO Classification Young Adult T-score BMD         %Change vs. Previous Significant Change (*) DualFemur Neck Right 09/04/2020 81.8 Osteoporosis -2.5 0.693 g/cm2 -2.9% - DualFemur Neck Right 01/12/2016 77.2 Osteopenia -2.3 0.714 g/cm2 - - DualFemur Total Mean 09/04/2020 81.8 Osteopenia -2.0 0.753 g/cm2 6.2% Yes DualFemur Total Mean 01/12/2016 77.2 Osteopenia -2.4 0.709 g/cm2 - - Left Forearm Radius 33% 09/04/2020  81.8 Osteoporosis -2.8 0.635 g/cm2 - - ASSESSMENT: The BMD measured at Forearm Radius 33% is 0.635 g/cm2 with a T-score of -2.8. This patient is considered osteoporotic according to World Health Organization [facility]) criteria. The scan quality is good. Lumbar spine was not utilized due to advanced degenerative changes. Compared with prior study, there has been a significant increase in the total hip. World Science writer [facility]) criteria for post-menopausal, Caucasian Women: Normal:                   T-score at or above -1 SD Osteopenia/low bone mass: T-score between -1 and -2.5 SD Osteoporosis:             T-score at or below -2.5 SD RECOMMENDATIONS: 1. All patients should optimize calcium and vitamin D intake. 2. Consider FDA-approved medical therapies in postmenopausal women and men aged 34 years and older, based on the following: a. A hip or vertebral(clinical or morphometric) fracture b. T-score < -2.5 at the femoral neck or spine after appropriate evaluation to exclude secondary causes c. Low bone mass (T-score between -1.0 and -2.5 at the femoral neck or spine) and a 10-year probability of a hip fracture > 3% or a 10-year probability of a major osteoporosis-related fracture > 20% based on the [place]-adapted WHO algorithm 3. Clinician judgment and/or patient preferences may indicate treatment for people with 10-year fracture probabilities above or below these levels FOLLOW-UP: People with diagnosed cases of osteoporosis or at high risk for fracture should have regular bone mineral density tests. For patients eligible for Medicare, routine testing is allowed once every 2 years. The testing frequency can be increased to one year for patients who have rapidly progressing disease, those who are receiving or discontinuing medical therapy to restore bone mass, or have additional risk factors. I have reviewed this report, and agree with the above findings. Mark A. Tyron Russell, M.D. [facility] Electronically  Signed   By: Ulyses Southward M.D.   On: 09/05/2020 08:12    Assessment & Plan:   Problem List Items Addressed This Visit     Bleeding hemorrhoid    Secondary to constipation.  Recommending use of stool softeners, increased  water intake       Relevant Medications   amLODipine (NORVASC) 2.5 MG tablet   Chronic lumbar radiculopathy - Primary    Increase trmadol to three times daily       Essential hypertension    Starting amlpdipine       Relevant Medications   amLODipine (NORVASC) 2.5 MG tablet   Pseudodementia    Recommend more aggressive treatment of  depression and chronic pain .        spent a total of  40 minutes with this patient in a face to face visit on the date of this encounter reviewing the last office visit with me in       ,  most recent visit with  Dr. Lorin Picket, patient's diet and exercise habits, home blood pressure  and post visit ordering of testing and therapeutics.    Follow-up: Return in about 4 weeks (around 01/25/2022).   Sherlene Shams, MD

## 2021-12-28 NOTE — Assessment & Plan Note (Signed)
Starting amlpdipine

## 2021-12-28 NOTE — Assessment & Plan Note (Signed)
Secondary to constipation.  Recommending use of stool softeners, increased  water intake

## 2021-12-28 NOTE — Assessment & Plan Note (Addendum)
Recommend more aggressive treatment of depression and chronic pain .

## 2021-12-28 NOTE — Patient Instructions (Addendum)
Reduce the celebrex to once daily I n the morning with  1000 mg tylenol and tramadol   at breakfast  Take a 2nd tramadol   after lunch    Take 1000 mg tylenol  and one more tramadol in the evening   You need to try to drink 16 ounces of water  by lunchtime ,   another 16  ounces  by dinner and another 16 by bedtime   Continue using metamucil once daily.  Add 200 mg colace (stool softener) at bedtime   Adding amlodipine for blood pressure.   Take once daily (lowest dose 2.5 mg )   STOP WATCHING THE NEWS!    READ THE WORD OF GOD   Get up every morning and go for a walk

## 2021-12-28 NOTE — Assessment & Plan Note (Signed)
Increase trmadol to three times daily

## 2021-12-29 DIAGNOSIS — M4306 Spondylolysis, lumbar region: Secondary | ICD-10-CM | POA: Diagnosis not present

## 2021-12-29 DIAGNOSIS — M9905 Segmental and somatic dysfunction of pelvic region: Secondary | ICD-10-CM | POA: Diagnosis not present

## 2021-12-29 DIAGNOSIS — M9903 Segmental and somatic dysfunction of lumbar region: Secondary | ICD-10-CM | POA: Diagnosis not present

## 2021-12-29 DIAGNOSIS — M5417 Radiculopathy, lumbosacral region: Secondary | ICD-10-CM | POA: Diagnosis not present

## 2021-12-29 MED ORDER — TRAMADOL HCL 50 MG PO TABS: 50.0000 mg | ORAL_TABLET | Freq: Three times a day (TID) | ORAL | 3 refills | Status: DC | PRN

## 2021-12-29 NOTE — Telephone Encounter (Signed)
Medication dosing instructions have been changed to say every 8 hours.

## 2021-12-29 NOTE — Telephone Encounter (Signed)
The sig needs clarification. Pharmacy stated that the sig states every 6 hours and then a note states increase to three daily.

## 2021-12-30 DIAGNOSIS — M9905 Segmental and somatic dysfunction of pelvic region: Secondary | ICD-10-CM | POA: Diagnosis not present

## 2021-12-30 DIAGNOSIS — M9903 Segmental and somatic dysfunction of lumbar region: Secondary | ICD-10-CM | POA: Diagnosis not present

## 2021-12-30 DIAGNOSIS — M5417 Radiculopathy, lumbosacral region: Secondary | ICD-10-CM | POA: Diagnosis not present

## 2021-12-30 DIAGNOSIS — M4306 Spondylolysis, lumbar region: Secondary | ICD-10-CM | POA: Diagnosis not present

## 2021-12-30 NOTE — Telephone Encounter (Signed)
noted 

## 2022-01-01 ENCOUNTER — Telehealth: Payer: Self-pay | Admitting: Internal Medicine

## 2022-01-01 MED ORDER — AMLODIPINE BESYLATE 2.5 MG PO TABS: 2.5000 mg | ORAL_TABLET | Freq: Every day | ORAL | 1 refills | Status: DC

## 2022-01-01 NOTE — Telephone Encounter (Signed)
Patient's daughter called stating the patient's BP medicationamLODipine (NORVASC) 2.5 MG tablet  did not go through at [facility] . She stated she called this morning.

## 2022-01-01 NOTE — Telephone Encounter (Signed)
Rx has been resent to [facility] on [address]. Pt's daughter is aware.

## 2022-01-04 ENCOUNTER — Ambulatory Visit: Payer: Self-pay | Admitting: *Deleted

## 2022-01-04 NOTE — Patient Outreach (Signed)
  Care Coordination   Initial Visit Note   01/04/2022 Name: MARQUESHA ROBIDEAU MRN: [id number] DOB: 1940-04-17  Camillo Flaming is a 81 y.o. year old female who sees Darrick [facility], Mar Daring, MD for primary care. I spoke with  Camillo Flaming by phone today.  What matters to the patients health and wellness today?  Patient declined case management services at this time, however will discuss case management program further during her visit with her provider scheduled for 01/25/22    Goals Addressed             This Visit's Progress    care management activities       Care Coordination Interventions: Care management activities discussed/offered SDOH screening completed Patient confirms follow up appointment  with provider on 01/25/22 at 4pm Patient tearful during call stating that she becomes easily confused, however she was able to repeat back date and time of her next appointment with her provider with plan to discuss the case management program further           SDOH assessments and interventions completed:  Yes  SDOH Interventions Today    Flowsheet Row Most Recent Value  SDOH Interventions   Food Insecurity Interventions Intervention Not Indicated  Housing Interventions Intervention Not Indicated  Transportation Interventions Intervention Not Indicated        Care Coordination Interventions Activated:  Yes  Care Coordination Interventions:  Yes, provided   Follow up plan: No further intervention required.   Encounter Outcome:  Pt. Visit Completed

## 2022-01-04 NOTE — Patient Instructions (Signed)
Visit Information  Thank you for taking time to visit with me today. Please don't hesitate to contact me if I can be of assistance to you.   Following are the goals we discussed today:   Goals Addressed             This Visit's Progress    care management activities       Care Coordination Interventions: Care management activities discussed/offered SDOH screening completed Patient confirms follow up appointment  with provider on 01/25/22 at 4pm Patient tearful during call stating that she becomes easily confused, however she was able to repeat back date and time of her next appointment with her provider with plan to discuss the case management program further             Please call the care guide team at [phone] if you need to cancel or reschedule your appointment.   If you are experiencing a Mental Health or Behavioral Health Crisis or need someone to talk to, please call [phone]   Patient verbalizes understanding of instructions and care plan provided today and agrees to view in MyChart. Active MyChart status and patient understanding of how to access instructions and care plan via MyChart confirmed with patient.     No further follow up required: patient will discuss case management program further with provider during appointment on 01/25/22  Verna Czech, LCSW Clinical Social Worker  [facility] Care Management [phone]    \

## 2022-01-05 ENCOUNTER — Ambulatory Visit: Payer: Medicare PPO | Admitting: Internal Medicine

## 2022-01-16 DIAGNOSIS — M9903 Segmental and somatic dysfunction of lumbar region: Secondary | ICD-10-CM | POA: Diagnosis not present

## 2022-01-16 DIAGNOSIS — M4306 Spondylolysis, lumbar region: Secondary | ICD-10-CM | POA: Diagnosis not present

## 2022-01-16 DIAGNOSIS — M5417 Radiculopathy, lumbosacral region: Secondary | ICD-10-CM | POA: Diagnosis not present

## 2022-01-16 DIAGNOSIS — M9905 Segmental and somatic dysfunction of pelvic region: Secondary | ICD-10-CM | POA: Diagnosis not present

## 2022-01-25 ENCOUNTER — Ambulatory Visit: Payer: Medicare PPO | Admitting: Internal Medicine

## 2022-01-25 ENCOUNTER — Encounter: Payer: Self-pay | Admitting: Internal Medicine

## 2022-01-25 VITALS — BP 146/84 | HR 71 | Temp 97.7°F | Ht <= 58 in | Wt 102.8 lb

## 2022-01-25 DIAGNOSIS — M5416 Radiculopathy, lumbar region: Secondary | ICD-10-CM

## 2022-01-25 DIAGNOSIS — I10 Essential (primary) hypertension: Secondary | ICD-10-CM | POA: Diagnosis not present

## 2022-01-25 MED ORDER — AMLODIPINE BESYLATE 5 MG PO TABS: 5.0000 mg | ORAL_TABLET | Freq: Every day | ORAL | 1 refills | Status: DC

## 2022-01-25 MED ORDER — CELECOXIB 200 MG PO CAPS: 200.0000 mg | ORAL_CAPSULE | Freq: Every day | ORAL | 5 refills | Status: DC

## 2022-01-25 MED ORDER — TRAMADOL HCL 50 MG PO TABS: 50.0000 mg | ORAL_TABLET | Freq: Three times a day (TID) | ORAL | 5 refills | Status: AC | PRN

## 2022-01-25 NOTE — Progress Notes (Signed)
Subjective:  Patient ID: Natalie Coleman, female    DOB: 12-Aug-1940  Age: 81 y.o. MRN: [id number]  CC: The primary encounter diagnosis was Chronic lumbar radiculopathy. A diagnosis of Essential hypertension was also pertinent to this visit.   HPI [employer] presents for  Chief Complaint  Patient presents with   Follow-up    4 week follow up on chronic pain   1) Chronic pain :  she was seen 4 weeks ago and prescribed tramadol for management of chronic pain not relieved by celebrex and tylenol. Her daughter has been managing her medications and is not with her today.  She states that she is no longer in pain and is no longer taking the medication..  she denies side effects from the medication.  She is accompanied by a trusted friend of 30 years.  Who states that she IS taking the medication   Her mood and affect are significantly improved.   HTN:  Patient is taking her medications as prescribed and notes no adverse effects.  Home BP readings have been done about once per week and are  generally >140/90 .  She is taking celebrex twice daily  .    Outpatient Medications Prior to Visit  Medication Sig Dispense Refill   ammonium lactate (AMLACTIN) 12 % lotion Apply 1 application topically as needed for dry skin. 400 g 0   aspirin 81 MG tablet Take 81 mg by mouth daily.     Calcium Citrate 250 MG TABS Take 2 tablets by mouth 3 (three) times daily.     Coenzyme Q10 100 MG TABS Take 1 tablet by mouth daily.     denosumab (PROLIA) 60 MG/ML SOLN injection Inject 60 mg into the skin every 6 (six) months. Administer in upper arm, thigh, or abdomen     GARLIC PO Take 1 capsule by mouth daily.     Lactobacillus-Inulin (CULTURELLE DIGESTIVE HEALTH) CAPS Take 1 capsule by mouth daily. 14 capsule 0   Melatonin 5 MG TABS Take 1 tablet by mouth as needed.     Misc Natural Products (ADV TURMERIC CURCUMIN COMPLEX PO) Take 1 capsule by mouth daily.     Multiple Vitamins-Minerals (CENTRUM SILVER  PO) Take 1 tablet by mouth daily.     VALERIAN ROOT PO Take 1 capsule by mouth as needed.     vitamin B-12 (CYANOCOBALAMIN) 1000 MCG tablet Take 5,000 mcg by mouth daily.      vitamin C (ASCORBIC ACID) 500 MG tablet Take 500 mg by mouth daily.     amLODipine (NORVASC) 2.5 MG tablet Take 1 tablet (2.5 mg total) by mouth daily. 90 tablet 1   celecoxib (CELEBREX) 200 MG capsule Take 1 capsule (200 mg total) by mouth 2 (two) times daily. 60 capsule 5   traMADol (ULTRAM) 50 MG tablet Take 1 tablet (50 mg total) by mouth every 8 (eight) hours as needed for moderate pain. 90 tablet 3   No facility-administered medications prior to visit.    Review of Systems;  Patient denies headache, fevers, malaise, unintentional weight loss, skin rash, eye pain, sinus congestion and sinus pain, sore throat, dysphagia,  hemoptysis , cough, dyspnea, wheezing, chest pain, palpitations, orthopnea, edema, abdominal pain, nausea, melena, diarrhea, constipation, flank pain, dysuria, hematuria, urinary  Frequency, nocturia, numbness, tingling, seizures,  Focal weakness, Loss of consciousness,  Tremor, insomnia, depression, anxiety, and suicidal ideation.      Objective:  BP (!) 146/84   Pulse 71   Temp 97.7  F (36.5 C) (Oral)   Ht 4\' 8"  (1.422 m)   Wt 102 lb 12.8 oz (46.6 kg)   SpO2 97%   BMI 23.05 kg/m   BP Readings from Last 3 Encounters:  01/25/22 (!) 146/84  12/28/21 (!) 176/88  12/21/21 (!) 150/82    Wt Readings from Last 3 Encounters:  01/25/22 102 lb 12.8 oz (46.6 kg)  12/28/21 106 lb 9.6 oz (48.4 kg)  12/21/21 108 lb 11.2 oz (49.3 kg)    General appearance: alert, cooperative and appears stated age Ears: normal TM's and external ear canals both ears Throat: lips, mucosa, and tongue normal; teeth and gums normal Neck: no adenopathy, no carotid bruit, supple, symmetrical, trachea midline and thyroid not enlarged, symmetric, no tenderness/mass/nodules Back: symmetric, no curvature. ROM normal. No  CVA tenderness. Lungs: clear to auscultation bilaterally Heart: regular rate and rhythm, S1, S2 normal, no murmur, click, rub or gallop Abdomen: soft, non-tender; bowel sounds normal; no masses,  no organomegaly Pulses: 2+ and symmetric Skin: Skin color, texture, turgor normal. No rashes or lesions Lymph nodes: Cervical, supraclavicular, and axillary nodes normal. Neuro:  awake and interactive with normal mood and affect. Higher cortical functions are normal. Speech is clear without word-finding difficulty or dysarthria. Extraocular movements are intact. Visual fields of both eyes are grossly intact. Sensation to light touch is grossly intact bilaterally of upper and lower extremities. Motor examination shows 4+/5 symmetric hand grip and upper extremity and 5/5 lower extremity strength. There is no pronation or drift. Gait is non-ataxic   Lab Results  Component Value Date   HGBA1C 5.6 11/12/2021    Lab Results  Component Value Date   CREATININE 0.76 11/12/2021   CREATININE 0.82 08/15/2020   CREATININE 0.66 06/24/2020    Lab Results  Component Value Date   WBC 7.9 12/21/2021   HGB 11.9 (L) 12/21/2021   HCT 35.0 (L) 12/21/2021   PLT 291.0 12/21/2021   GLUCOSE 78 11/12/2021   CHOL 227 (H) 11/12/2021   TRIG 95.0 11/12/2021   HDL 62.20 11/12/2021   LDLDIRECT 143.0 11/12/2021   LDLCALC 146 (H) 11/12/2021   ALT 14 11/12/2021   AST 17 11/12/2021   NA 143 11/12/2021   K 3.6 11/12/2021   CL 107 11/12/2021   CREATININE 0.76 11/12/2021   BUN 31 (H) 11/12/2021   CO2 27 11/12/2021   TSH 1.66 11/12/2021   HGBA1C 5.6 11/12/2021    MM 3D SCREEN BREAST BILATERAL  Result Date: 09/05/2020 CLINICAL DATA:  Screening. EXAM: DIGITAL SCREENING BILATERAL MAMMOGRAM WITH TOMOSYNTHESIS AND CAD TECHNIQUE: Bilateral screening digital craniocaudal and mediolateral oblique mammograms were obtained. Bilateral screening digital breast tomosynthesis was performed. The images were evaluated with  computer-aided detection. COMPARISON:  Previous exam(s). ACR Breast Density Category c: The breast tissue is heterogeneously dense, which may obscure small masses. FINDINGS: There are no findings suspicious for malignancy. IMPRESSION: No mammographic evidence of malignancy. A result letter of this screening mammogram will be mailed directly to the patient. RECOMMENDATION: Screening mammogram in one year. (Code:SM-B-01Y) BI-RADS CATEGORY  1: Negative. Electronically Signed   By: 09/07/2020 M.D.   On: 09/05/2020 14:07  DEXAScan  Result Date: 09/05/2020 EXAM: DUAL X-RAY ABSORPTIOMETRY (DXA) FOR BONE MINERAL DENSITY IMPRESSION: Your patient Natalie Coleman completed a BMD test on 09/04/2020 using the 09/06/2020 iDXA DXA System (software version: 14.10) manufactured by [employer]. The following summarizes the results of our evaluation. Technologist: SCE PATIENT BIOGRAPHICAL: Name: Natalie Coleman, Natalie Coleman Patient ID: Natalie Coleman Birth  Date: 1941/02/06 Height: 53.5 in. Gender: Female Exam Date: 09/04/2020 Weight: 103.3 lbs. Indications: Advanced Age, Early Menopause, Height Loss, High Risk Meds, History of Fracture (Adult), History of Osteoporosis, Hysterectomy, Postmenopausal Fractures: Tibia/Fibula Treatments: Calcium, Multi-Vitamin, Prolia DENSITOMETRY RESULTS: Site         Region     Measured Date Measured Age WHO Classification Young Adult T-score BMD         %Change vs. Previous Significant Change (*) DualFemur Neck Right 09/04/2020 81.8 Osteoporosis -2.5 0.693 g/cm2 -2.9% - DualFemur Neck Right 01/12/2016 77.2 Osteopenia -2.3 0.714 g/cm2 - - DualFemur Total Mean 09/04/2020 81.8 Osteopenia -2.0 0.753 g/cm2 6.2% Yes DualFemur Total Mean 01/12/2016 77.2 Osteopenia -2.4 0.709 g/cm2 - - Left Forearm Radius 33% 09/04/2020 81.8 Osteoporosis -2.8 0.635 g/cm2 - - ASSESSMENT: The BMD measured at Forearm Radius 33% is 0.635 g/cm2 with a T-score of -2.8. This patient is considered osteoporotic according to World  Health Organization [facility]) criteria. The scan quality is good. Lumbar spine was not utilized due to advanced degenerative changes. Compared with prior study, there has been a significant increase in the total hip. World Science writer [facility]) criteria for post-menopausal, Caucasian Women: Normal:                   T-score at or above -1 SD Osteopenia/low bone mass: T-score between -1 and -2.5 SD Osteoporosis:             T-score at or below -2.5 SD RECOMMENDATIONS: 1. All patients should optimize calcium and vitamin D intake. 2. Consider FDA-approved medical therapies in postmenopausal women and men aged 20 years and older, based on the following: a. A hip or vertebral(clinical or morphometric) fracture b. T-score < -2.5 at the femoral neck or spine after appropriate evaluation to exclude secondary causes c. Low bone mass (T-score between -1.0 and -2.5 at the femoral neck or spine) and a 10-year probability of a hip fracture > 3% or a 10-year probability of a major osteoporosis-related fracture > 20% based on the [place]-adapted WHO algorithm 3. Clinician judgment and/or patient preferences may indicate treatment for people with 10-year fracture probabilities above or below these levels FOLLOW-UP: People with diagnosed cases of osteoporosis or at high risk for fracture should have regular bone mineral density tests. For patients eligible for Medicare, routine testing is allowed once every 2 years. The testing frequency can be increased to one year for patients who have rapidly progressing disease, those who are receiving or discontinuing medical therapy to restore bone mass, or have additional risk factors. I have reviewed this report, and agree with the above findings. Mark A. Tyron Russell, M.D. [facility] Electronically Signed   By: Ulyses Southward M.D.   On: 09/05/2020 08:12    Assessment & Plan:   Problem List Items Addressed This Visit     Essential hypertension    Increase amlodipine to 5 mg daily        Relevant Medications   amLODipine (NORVASC) 5 MG tablet   Chronic lumbar radiculopathy - Primary    Increase trmadol to three times daily and reduce celebrex to once daily         Follow-up: Return in about 3 months (around 04/26/2022).   Sherlene Shams, MD

## 2022-01-25 NOTE — Assessment & Plan Note (Addendum)
Increase trmadol to three times daily and reduce celebrex to once daily

## 2022-01-25 NOTE — Assessment & Plan Note (Signed)
Increase amlodipine to 5mg daily

## 2022-01-25 NOTE — Patient Instructions (Addendum)
Increase  the amlodipine to 5 mg daily  for your blood pressure  Decrease the celebrex to ONCE DAILY for back pain  Continue using tramadol UP TO 3 TIMES DAILY FOR BACK PAIN

## 2022-01-27 ENCOUNTER — Ambulatory Visit: Payer: Medicare PPO | Admitting: Internal Medicine

## 2022-02-12 ENCOUNTER — Telehealth: Payer: Self-pay

## 2022-02-12 NOTE — Telephone Encounter (Signed)
Attempted to call pt's daughter. Mailbox is full. 

## 2022-02-12 NOTE — Telephone Encounter (Signed)
Patient's daughter, Arnetha Massy, is on the phone with patient.  [name] states they are at the [facility] on [address] and pharmacist states they cannot refill the pain medication for patient until 02/21/2021.  [name] states patient is experiencing pain in her right leg from an accident years ago.  [name] states they would like to verify the number of pain pills patient received the last time medication was filled to compare with what pharmacist is telling them.  I spoke with Shanda Bumps and she states she will check with Dr. Duncan Dull.  I relayed message to [place].

## 2022-03-26 DIAGNOSIS — M9905 Segmental and somatic dysfunction of pelvic region: Secondary | ICD-10-CM | POA: Diagnosis not present

## 2022-03-26 DIAGNOSIS — M4306 Spondylolysis, lumbar region: Secondary | ICD-10-CM | POA: Diagnosis not present

## 2022-03-26 DIAGNOSIS — M5417 Radiculopathy, lumbosacral region: Secondary | ICD-10-CM | POA: Diagnosis not present

## 2022-03-26 DIAGNOSIS — M9903 Segmental and somatic dysfunction of lumbar region: Secondary | ICD-10-CM | POA: Diagnosis not present

## 2022-03-30 ENCOUNTER — Encounter: Payer: Self-pay | Admitting: Internal Medicine

## 2022-03-30 ENCOUNTER — Ambulatory Visit: Payer: Medicare PPO | Admitting: Internal Medicine

## 2022-03-30 ENCOUNTER — Telehealth: Payer: Self-pay | Admitting: Internal Medicine

## 2022-03-30 VITALS — BP 168/82 | HR 79 | Temp 97.9°F | Ht <= 58 in | Wt 105.8 lb

## 2022-03-30 DIAGNOSIS — I10 Essential (primary) hypertension: Secondary | ICD-10-CM

## 2022-03-30 DIAGNOSIS — R4189 Other symptoms and signs involving cognitive functions and awareness: Secondary | ICD-10-CM

## 2022-03-30 DIAGNOSIS — E785 Hyperlipidemia, unspecified: Secondary | ICD-10-CM

## 2022-03-30 DIAGNOSIS — E559 Vitamin D deficiency, unspecified: Secondary | ICD-10-CM | POA: Diagnosis not present

## 2022-03-30 DIAGNOSIS — M5416 Radiculopathy, lumbar region: Secondary | ICD-10-CM

## 2022-03-30 DIAGNOSIS — D509 Iron deficiency anemia, unspecified: Secondary | ICD-10-CM | POA: Diagnosis not present

## 2022-03-30 DIAGNOSIS — D649 Anemia, unspecified: Secondary | ICD-10-CM | POA: Diagnosis not present

## 2022-03-30 MED ORDER — AMLODIPINE BESYLATE 5 MG PO TABS: 5.0000 mg | ORAL_TABLET | Freq: Every day | ORAL | 0 refills | Status: AC

## 2022-03-30 NOTE — Patient Instructions (Addendum)
Please confirm that you are using amlodipine 5 mg daily because your blood pressure is very high   You need to take amlodipine EVERY DAY   PLEASE RETURN ON A DAY THAT YOU HAVE TAKEN YOUR AMLODIPINE   I WANT YOU TO PURCHASE AN AUTOMATIC BLOOD PRESSURE MACHINE FROM A COMPANY CALLED [employer],  IT IS CARRIED BY [employer]   OK TO  CONTINUE TAKING TYLENOL  TWO TIMES DAILY FOR YOUR BACK PAIN  ADD CELEBREX IF YOUR PAIN BECOMES SEVERE  TWICE DAILY   YOUR NEXT PROLIA INJECTION IS DUE  ON OR AFTER May 18 2022

## 2022-03-30 NOTE — Assessment & Plan Note (Signed)
At last visit I  recommended  more aggressive treatment of depression and chronic pain .   Her mood seems better since her daughter moved back home, and her pain is controlled with tylenol

## 2022-03-30 NOTE — Progress Notes (Signed)
Subjective:  Patient ID: Natalie Coleman, female    DOB: October 15, 1940  Age: 82 y.o. MRN: [id number]  CC: The primary encounter diagnosis was Hyperlipidemia with target LDL less than 130. Diagnoses of Vitamin D deficiency, Essential hypertension, Iron deficiency anemia, unspecified iron deficiency anemia type, Chronic lumbar radiculopathy, and Pseudodementia were also pertinent to this visit.   HPI Natalie Coleman presents for  Chief Complaint  Patient presents with   Medical Management of Chronic Issues   1) chronic low back pain: feeling better ,  living aloNe again. Her daughter Thayer Headings has returned to her own home weeks ago .  She states that she is not taking celebrex and tramadol , last refill dec 11 for #90 .  Her daughter was filling her pill box once a week  but she has stopped doing this  since she is no longer staying with her .  Patient is  happier  with daughter out of the home;  less conflict ; however patient is not clear about her medications  She is interested in managing her own medications because she wants to take care of herself .  TAKING  TYLENOL   2) HTN:  taking amlodipine 5 MG NOT SURE THINKS SHE TOOK IT YESTERDAY    Outpatient Medications Prior to Visit  Medication Sig Dispense Refill   ammonium lactate (AMLACTIN) 12 % lotion Apply 1 application topically as needed for dry skin. 400 g 0   aspirin 81 MG tablet Take 81 mg by mouth daily.     Calcium Citrate 250 MG TABS Take 2 tablets by mouth 3 (three) times daily.     celecoxib (CELEBREX) 200 MG capsule Take 1 capsule (200 mg total) by mouth daily. 30 capsule 5   Coenzyme Q10 100 MG TABS Take 1 tablet by mouth daily.     denosumab (PROLIA) 60 MG/ML SOLN injection Inject 60 mg into the skin every 6 (six) months. Administer in upper arm, thigh, or abdomen     GARLIC PO Take 1 capsule by mouth daily.     Lactobacillus-Inulin ([place]) CAPS Take 1 capsule by mouth daily. 14 capsule 0    Melatonin 5 MG TABS Take 1 tablet by mouth as needed.     Misc Natural Products (ADV TURMERIC CURCUMIN COMPLEX PO) Take 1 capsule by mouth daily.     Multiple Vitamins-Minerals (CENTRUM SILVER PO) Take 1 tablet by mouth daily.     traMADol (ULTRAM) 50 MG tablet Take 1 tablet (50 mg total) by mouth every 8 (eight) hours as needed for moderate pain. 90 tablet 5   VALERIAN ROOT PO Take 1 capsule by mouth as needed.     vitamin B-12 (CYANOCOBALAMIN) 1000 MCG tablet Take 5,000 mcg by mouth daily.      vitamin C (ASCORBIC ACID) 500 MG tablet Take 500 mg by mouth daily.     amLODipine (NORVASC) 5 MG tablet Take 1 tablet (5 mg total) by mouth daily. 90 tablet 1   No facility-administered medications prior to visit.    Review of Systems;  Patient denies headache, fevers, malaise, unintentional weight loss, skin rash, eye pain, sinus congestion and sinus pain, sore throat, dysphagia,  hemoptysis , cough, dyspnea, wheezing, chest pain, palpitations, orthopnea, edema, abdominal pain, nausea, melena, diarrhea, constipation, flank pain, dysuria, hematuria, urinary  Frequency, nocturia, numbness, tingling, seizures,  Focal weakness, Loss of consciousness,  Tremor, insomnia, depression, anxiety, and suicidal ideation.      Objective:  BP [place] Kitchen)  168/82   Pulse 79   Temp 97.9 F (36.6 C) (Oral)   Ht 4\' 8"  (1.422 m)   Wt 105 lb 12.8 oz (48 kg)   SpO2 97%   BMI 23.72 kg/m   BP Readings from Last 3 Encounters:  03/30/22 (!) 168/82  01/25/22 (!) 146/84  12/28/21 (!) 176/88    Wt Readings from Last 3 Encounters:  03/30/22 105 lb 12.8 oz (48 kg)  01/25/22 102 lb 12.8 oz (46.6 kg)  12/28/21 106 lb 9.6 oz (48.4 kg)    Physical Exam  Lab Results  Component Value Date   HGBA1C 5.6 11/12/2021    Lab Results  Component Value Date   CREATININE 0.76 11/12/2021   CREATININE 0.82 08/15/2020   CREATININE 0.66 06/24/2020    Lab Results  Component Value Date   WBC 7.9 12/21/2021   HGB 11.9 (L)  12/21/2021   HCT 35.0 (L) 12/21/2021   PLT 291.0 12/21/2021   GLUCOSE 78 11/12/2021   CHOL 227 (H) 11/12/2021   TRIG 95.0 11/12/2021   HDL 62.20 11/12/2021   LDLDIRECT 143.0 11/12/2021   LDLCALC 146 (H) 11/12/2021   ALT 14 11/12/2021   AST 17 11/12/2021   NA 143 11/12/2021   K 3.6 11/12/2021   CL 107 11/12/2021   CREATININE 0.76 11/12/2021   BUN 31 (H) 11/12/2021   CO2 27 11/12/2021   TSH 1.66 11/12/2021   HGBA1C 5.6 11/12/2021    MM 3D SCREEN BREAST BILATERAL  Result Date: 09/05/2020 CLINICAL DATA:  Screening. EXAM: DIGITAL SCREENING BILATERAL MAMMOGRAM WITH TOMOSYNTHESIS AND CAD TECHNIQUE: Bilateral screening digital craniocaudal and mediolateral oblique mammograms were obtained. Bilateral screening digital breast tomosynthesis was performed. The images were evaluated with computer-aided detection. COMPARISON:  Previous exam(s). ACR Breast Density Category c: The breast tissue is heterogeneously dense, which may obscure small masses. FINDINGS: There are no findings suspicious for malignancy. IMPRESSION: No mammographic evidence of malignancy. A result letter of this screening mammogram will be mailed directly to the patient. RECOMMENDATION: Screening mammogram in one year. (Code:SM-B-01Y) BI-RADS CATEGORY  1: Negative. Electronically Signed   By: Lajean Manes M.D.   On: 09/05/2020 14:07  DEXAScan  Result Date: 09/05/2020 EXAM: DUAL X-RAY ABSORPTIOMETRY (DXA) FOR BONE MINERAL DENSITY IMPRESSION: Your patient Natalie Coleman completed a BMD test on 09/04/2020 using the [place] (software version: 14.10) manufactured by [employer]. The following summarizes the results of our evaluation. Technologist: SCE PATIENT BIOGRAPHICAL: Name: Natalie Coleman, Natalie Coleman Patient ID: [id number] Birth Date: 1940-10-31 Height: 53.5 in. Gender: Female Exam Date: 09/04/2020 Weight: 103.3 lbs. Indications: Advanced Age, Early Menopause, Height Loss, High Risk Meds, History of Fracture  (Adult), History of Osteoporosis, Hysterectomy, Postmenopausal Fractures: Tibia/Fibula Treatments: Calcium, Multi-Vitamin, Prolia DENSITOMETRY RESULTS: Site         Region     Measured Date Measured Age WHO Classification Young Adult T-score BMD         %Change vs. Previous Significant Change (*) DualFemur Neck Right 09/04/2020 81.8 Osteoporosis -2.5 0.693 g/cm2 -2.9% - DualFemur Neck Right 01/12/2016 77.2 Osteopenia -2.3 0.714 g/cm2 - - DualFemur Total Mean 09/04/2020 81.8 Osteopenia -2.0 0.753 g/cm2 6.2% Yes DualFemur Total Mean 01/12/2016 77.2 Osteopenia -2.4 0.709 g/cm2 - - Left Forearm Radius 33% 09/04/2020 81.8 Osteoporosis -2.8 0.635 g/cm2 - - ASSESSMENT: The BMD measured at Forearm Radius 33% is 0.635 g/cm2 with a T-score of -2.8. This patient is considered osteoporotic according to [facility]) criteria. The scan quality is good.  Lumbar spine was not utilized due to advanced degenerative changes. Compared with prior study, there has been a significant increase in the total hip. World Pharmacologist [facility]) criteria for post-menopausal, Caucasian Women: Normal:                   T-score at or above -1 SD Osteopenia/low bone mass: T-score between -1 and -2.5 SD Osteoporosis:             T-score at or below -2.5 SD RECOMMENDATIONS: 1. All patients should optimize calcium and vitamin D intake. 2. Consider FDA-approved medical therapies in postmenopausal women and men aged 81 years and older, based on the following: a. A hip or vertebral(clinical or morphometric) fracture b. T-score < -2.5 at the femoral neck or spine after appropriate evaluation to exclude secondary causes c. Low bone mass (T-score between -1.0 and -2.5 at the femoral neck or spine) and a 10-year probability of a hip fracture > 3% or a 10-year probability of a major osteoporosis-related fracture > 20% based on the [place]-adapted WHO algorithm 3. Clinician judgment and/or patient preferences may indicate treatment for people  with 10-year fracture probabilities above or below these levels FOLLOW-UP: People with diagnosed cases of osteoporosis or at high risk for fracture should have regular bone mineral density tests. For patients eligible for Medicare, routine testing is allowed once every 2 years. The testing frequency can be increased to one year for patients who have rapidly progressing disease, those who are receiving or discontinuing medical therapy to restore bone mass, or have additional risk factors. I have reviewed this report, and agree with the above findings. Mark A. Thornton Papas, M.D. [facility] Electronically Signed   By: Lavonia Dana M.D.   On: 09/05/2020 08:12    Assessment & Plan:  .Hyperlipidemia with target LDL less than 130  Vitamin D deficiency -     VITAMIN D 25 Hydroxy (Vit-D Deficiency, Fractures)  Essential hypertension Assessment & Plan: she reports compliance with medication regimen  but has an elevated reading today in office.  She is not using NSAIDs daily.  Discussed goal of   (130/80 for patients over 70)  to preserve renal function.  She has been asked to check her  BP  at home and  submit readings for evaluation.   Orders: -     Comprehensive metabolic panel -     Microalbumin / creatinine urine ratio  Iron deficiency anemia, unspecified iron deficiency anemia type -     CBC with Differential/Platelet -     Iron, TIBC and Ferritin Panel  Chronic lumbar radiculopathy Assessment & Plan: Currently quiescent.  Managed with tylenol .  Advised to add  tramadol when pain is aggravated by activity given her uncontrolled hypertension    Pseudodementia Assessment & Plan: At last visit I  recommended  more aggressive treatment of depression and chronic pain .   Her mood seems better since her daughter moved back home, and her pain is controlled with tylenol   Other orders -     amLODIPine Besylate; Take 1 tablet (5 mg total) by mouth daily.  Dispense: 90 tablet; Refill:  0     I provided 30 minutes of face-to-face time during this encounter reviewing patient's last visit with me,   recent surgical and non surgical procedures, previous  labs and imaging studies, counseling on currently addressed issues,  and post visit ordering to diagnostics and therapeutics .   Follow-up: No follow-ups on file.  Crecencio Mc, MD

## 2022-03-30 NOTE — Assessment & Plan Note (Signed)
she reports compliance with medication regimen  but has an elevated reading today in office.  She is not using NSAIDs daily.  Discussed goal of   (130/80 for patients over 70)  to preserve renal function.  She has been asked to check her  BP  at home and  submit readings for evaluation.

## 2022-03-30 NOTE — Assessment & Plan Note (Addendum)
Currently quiescent.  Managed with tylenol .  Advised to add  tramadol when pain is aggravated by activity given her uncontrolled hypertension

## 2022-03-30 NOTE — Telephone Encounter (Signed)
During checkout pt mentioned that she needs her Second Prolia shot in 6 months. I scheduled her for 4/8 @10am$ .

## 2022-03-31 LAB — COMPREHENSIVE METABOLIC PANEL
ALT: 73 U/L — ABNORMAL HIGH (ref 0–35)
AST: 62 U/L — ABNORMAL HIGH (ref 0–37)
Albumin: 4.5 g/dL (ref 3.5–5.2)
Alkaline Phosphatase: 49 U/L (ref 39–117)
BUN: 31 mg/dL — ABNORMAL HIGH (ref 6–23)
CO2: 25 mEq/L (ref 19–32)
Calcium: 10 mg/dL (ref 8.4–10.5)
Chloride: 103 mEq/L (ref 96–112)
Creatinine, Ser: 0.88 mg/dL (ref 0.40–1.20)
GFR: 60.77 mL/min (ref >60.00)
Glucose, Bld: 90 mg/dL (ref 70–99)
Potassium: 4.1 mEq/L (ref 3.5–5.1)
Sodium: 137 mEq/L (ref 135–145)
Total Bilirubin: 0.3 mg/dL (ref 0.2–1.2)
Total Protein: 7.1 g/dL (ref 6.0–8.3)

## 2022-03-31 LAB — CBC WITH DIFFERENTIAL/PLATELET
Basophils Absolute: 0.1 10*3/uL (ref 0.0–0.1)
Basophils Relative: 0.8 % (ref 0.0–3.0)
Eosinophils Absolute: 0.1 10*3/uL (ref 0.0–0.7)
Eosinophils Relative: 1.8 % (ref 0.0–5.0)
HCT: 31.8 % — ABNORMAL LOW (ref 36.0–46.0)
Hemoglobin: 10.2 g/dL — ABNORMAL LOW (ref 12.0–15.0)
Lymphocytes Relative: 28.7 % (ref 12.0–46.0)
Lymphs Abs: 2.1 10*3/uL (ref 0.7–4.0)
MCHC: 32 g/dL (ref 30.0–36.0)
MCV: 82.1 fl (ref 78.0–100.0)
Monocytes Absolute: 0.6 10*3/uL (ref 0.1–1.0)
Monocytes Relative: 8.5 % (ref 3.0–12.0)
Neutro Abs: 4.4 10*3/uL (ref 1.4–7.7)
Neutrophils Relative %: 60.2 % (ref 43.0–77.0)
Platelets: 347 10*3/uL (ref 150.0–400.0)
RBC: 3.88 Mil/uL (ref 3.87–5.11)
RDW: 18.8 % — ABNORMAL HIGH (ref 11.5–15.5)
WBC: 7.3 10*3/uL (ref 4.0–10.5)

## 2022-03-31 LAB — MICROALBUMIN / CREATININE URINE RATIO
Creatinine,U: 76.1 mg/dL
Microalb Creat Ratio: 9.4 mg/g (ref 0.0–30.0)
Microalb, Ur: 7.2 mg/dL — ABNORMAL HIGH (ref 0.0–1.9)

## 2022-03-31 LAB — IRON,TIBC AND FERRITIN PANEL
%SAT: 24 % (calc) (ref 16–45)
Ferritin: 9 ng/mL — ABNORMAL LOW (ref 16–288)
Iron: 93 ug/dL (ref 45–160)
TIBC: 386 mcg/dL (calc) (ref 250–450)

## 2022-03-31 LAB — VITAMIN D 25 HYDROXY (VIT D DEFICIENCY, FRACTURES): VITD: 51.03 ng/mL (ref 30.00–100.00)

## 2022-04-01 NOTE — Addendum Note (Signed)
Addended by: Crecencio Mc on: 04/01/2022 05:07 PM   Modules accepted: Orders

## 2022-04-06 ENCOUNTER — Other Ambulatory Visit: Payer: Medicare PPO

## 2022-04-08 ENCOUNTER — Ambulatory Visit: Payer: Medicare PPO

## 2022-04-09 ENCOUNTER — Encounter: Payer: Self-pay | Admitting: Nurse Practitioner

## 2022-04-09 ENCOUNTER — Ambulatory Visit: Payer: Medicare PPO | Admitting: Nurse Practitioner

## 2022-04-09 VITALS — BP 118/78 | HR 81 | Temp 97.6°F | Ht <= 58 in | Wt 108.0 lb

## 2022-04-09 DIAGNOSIS — S20211A Contusion of right front wall of thorax, initial encounter: Secondary | ICD-10-CM | POA: Diagnosis not present

## 2022-04-09 DIAGNOSIS — S0033XA Contusion of nose, initial encounter: Secondary | ICD-10-CM

## 2022-04-09 DIAGNOSIS — W19XXXA Unspecified fall, initial encounter: Secondary | ICD-10-CM

## 2022-04-09 DIAGNOSIS — S8001XA Contusion of right knee, initial encounter: Secondary | ICD-10-CM

## 2022-04-09 NOTE — Patient Instructions (Addendum)
Use OTC ibuprofen every 4 to 6 hours. Use ice on the bruises. Rest,ice, elevation of the right knee.  X-rays ordered schedule an appointment for x-rays.  Call the office if symptoms does not improves.

## 2022-04-09 NOTE — Progress Notes (Unsigned)
Established Patient Office Visit  Subjective:  Patient ID: Natalie Coleman, female    DOB: Mar 15, 1940  Age: 82 y.o. MRN: [id number]  CC: No chief complaint on file.  HPI  Natalie Coleman presents for c/o fall yesterday night around 7 pm. She is accompanied by her daughter.  She was going to kitchen and was not wearing her sleeper. She slipped on the wooden floor. She did not remember anything about the fall. She states that her right sides hurts. She has bursing on the nose. The daughter states that she was bleeding from her nose when she arrived at the home.   She has brises on both the knees but the right side hurt more.   She got up and called the neighbours for help.    Fall The accident occurred 2 days ago.     Past Medical History:  Diagnosis Date   Colon polyps    GERD (gastroesophageal reflux disease)    Hemorrhoids    History of blood transfusion    Osteoporosis     Past Surgical History:  Procedure Laterality Date   CESAREAN SECTION  [id number]   CHOLECYSTECTOMY  2008   COLONOSCOPY  2009   COLONOSCOPY WITH PROPOFOL N/A 12/10/2015   Procedure: COLONOSCOPY WITH PROPOFOL;  Surgeon: Robert Bellow, MD;  Location: [facility] ENDOSCOPY;  Service: Endoscopy;  Laterality: N/A;   EYE SURGERY Right    FRACTURE SURGERY     right tibia   ORIF ANKLE FRACTURE BIMALLEOLAR  March 2006   ORIF TIBIA & FIBULA FRACTURES  March 2006    Family History  Problem Relation Age of Onset   Hypertension Father    Hypertension Mother    Hypertension Sister    Breast cancer Cousin    Cancer Neg Hx     Social History   Socioeconomic History   Marital status: Divorced    Spouse name: Not on file   Number of children: Not on file   Years of education: Not on file   Highest education level: Not on file  Occupational History   Not on file  Tobacco Use   Smoking status: Never   Smokeless tobacco: Never  Substance and Sexual Activity   Alcohol use: Yes    Alcohol/week: 1.0  standard drink of alcohol    Types: 1 Glasses of wine per week    Comment: ocassional   Drug use: No   Sexual activity: Never  Other Topics Concern   Not on file  Social History Narrative   Not on file   Social Determinants of Health   Financial Resource Strain: Low Risk  (06/29/2021)   Overall Financial Resource Strain (CARDIA)    Difficulty of Paying Living Expenses: Not hard at all  Food Insecurity: No Food Insecurity (01/04/2022)   Hunger Vital Sign    Worried About Running Out of Food in the Last Year: Never true    Ran Out of Food in the Last Year: Never true  Transportation Needs: No Transportation Needs (01/04/2022)   PRAPARE - Hydrologist (Medical): No    Lack of Transportation (Non-Medical): No  Physical Activity: Unknown (05/03/2017)   Exercise Vital Sign    Days of Exercise per Week: 0 days    Minutes of Exercise per Session: Not on file  Stress: No Stress Concern Present (06/29/2021)   [place]    Feeling of Stress : Not at all  Social Connections: Unknown (06/29/2021)   Social Connection and Isolation Panel [NHANES]    Frequency of Communication with Friends and Family: Once a week    Frequency of Social Gatherings with Friends and Family: More than three times a week    Attends Religious Services: Not on file    Active Member of Clubs or Organizations: Not on file    Attends Archivist Meetings: Not on file    Marital Status: Not on file  Intimate Partner Violence: Not At Risk (06/29/2021)   Humiliation, Afraid, Rape, and Kick questionnaire    Fear of Current or Ex-Partner: No    Emotionally Abused: No    Physically Abused: No    Sexually Abused: No     Outpatient Medications Prior to Visit  Medication Sig Dispense Refill   amLODipine (NORVASC) 5 MG tablet Take 1 tablet (5 mg total) by mouth daily. 90 tablet 0   ammonium lactate (AMLACTIN) 12 % lotion  Apply 1 application topically as needed for dry skin. 400 g 0   aspirin 81 MG tablet Take 81 mg by mouth daily.     Calcium Citrate 250 MG TABS Take 2 tablets by mouth 3 (three) times daily.     celecoxib (CELEBREX) 200 MG capsule Take 1 capsule (200 mg total) by mouth daily. 30 capsule 5   Coenzyme Q10 100 MG TABS Take 1 tablet by mouth daily.     denosumab (PROLIA) 60 MG/ML SOLN injection Inject 60 mg into the skin every 6 (six) months. Administer in upper arm, thigh, or abdomen     GARLIC PO Take 1 capsule by mouth daily.     Lactobacillus-Inulin ([place]) CAPS Take 1 capsule by mouth daily. 14 capsule 0   Melatonin 5 MG TABS Take 1 tablet by mouth as needed.     Misc Natural Products (ADV TURMERIC CURCUMIN COMPLEX PO) Take 1 capsule by mouth daily.     Multiple Vitamins-Minerals (CENTRUM SILVER PO) Take 1 tablet by mouth daily.     traMADol (ULTRAM) 50 MG tablet Take 1 tablet (50 mg total) by mouth every 8 (eight) hours as needed for moderate pain. 90 tablet 5   VALERIAN ROOT PO Take 1 capsule by mouth as needed.     vitamin B-12 (CYANOCOBALAMIN) 1000 MCG tablet Take 5,000 mcg by mouth daily.      vitamin C (ASCORBIC ACID) 500 MG tablet Take 500 mg by mouth daily.     No facility-administered medications prior to visit.    Allergies  Allergen Reactions   Dexamethasone     Excessive jitteriness , confusion     ROS Review of Systems    Objective:    Physical Exam HENT:     Nose:      Comments: Bruise more on right Musculoskeletal:       Arms:       Legs:     There were no vitals taken for this visit. Wt Readings from Last 3 Encounters:  03/30/22 105 lb 12.8 oz (48 kg)  01/25/22 102 lb 12.8 oz (46.6 kg)  12/28/21 106 lb 9.6 oz (48.4 kg)     Health Maintenance  Topic Date Due   DTaP/Tdap/Td (2 - Tdap) 08/16/2014   MAMMOGRAM  09/04/2021   INFLUENZA VACCINE  05/16/2022 (Originally 09/15/2021)   Zoster Vaccines- Shingrix (1 of 2) 06/27/2022  (Originally 10/24/1988)   Medicare Annual Wellness (AWV)  06/30/2022   Pneumonia Vaccine 4+ Years old  Completed  DEXA SCAN  Completed   HPV VACCINES  Aged Out   COVID-19 Vaccine  Discontinued    There are no preventive care reminders to display for this patient.  Lab Results  Component Value Date   TSH 1.66 11/12/2021   Lab Results  Component Value Date   WBC 7.3 03/30/2022   HGB 10.2 (L) 03/30/2022   HCT 31.8 (L) 03/30/2022   MCV 82.1 03/30/2022   PLT 347.0 03/30/2022   Lab Results  Component Value Date   NA 137 03/30/2022   K 4.1 03/30/2022   CO2 25 03/30/2022   GLUCOSE 90 03/30/2022   BUN 31 (H) 03/30/2022   CREATININE 0.88 03/30/2022   BILITOT 0.3 03/30/2022   ALKPHOS 49 03/30/2022   AST 62 (H) 03/30/2022   ALT 73 (H) 03/30/2022   PROT 7.1 03/30/2022   ALBUMIN 4.5 03/30/2022   CALCIUM 10.0 03/30/2022   ANIONGAP 10 06/24/2020   GFR 60.77 03/30/2022   Lab Results  Component Value Date   CHOL 227 (H) 11/12/2021   Lab Results  Component Value Date   HDL 62.20 11/12/2021   Lab Results  Component Value Date   LDLCALC 146 (H) 11/12/2021   Lab Results  Component Value Date   TRIG 95.0 11/12/2021   Lab Results  Component Value Date   CHOLHDL 4 11/12/2021   Lab Results  Component Value Date   HGBA1C 5.6 11/12/2021      Assessment & Plan:   Problem List Items Addressed This Visit   None    No orders of the defined types were placed in this encounter.    Follow-up: No follow-ups on file.    Theresia Lo, NP

## 2022-04-12 ENCOUNTER — Encounter: Payer: Self-pay | Admitting: Nurse Practitioner

## 2022-04-12 ENCOUNTER — Other Ambulatory Visit: Payer: Medicare PPO

## 2022-04-12 DIAGNOSIS — W19XXXA Unspecified fall, initial encounter: Secondary | ICD-10-CM | POA: Insufficient documentation

## 2022-04-12 NOTE — Assessment & Plan Note (Signed)
X-rays ordered. She does not want to go to the hospital for x-rays. Will reschedule her appointment on Monday for x-rays in the office. Advised her to take Tylenol for pain and apply ice on the bruises.

## 2022-04-13 ENCOUNTER — Other Ambulatory Visit: Payer: Medicare PPO

## 2022-04-14 ENCOUNTER — Other Ambulatory Visit (INDEPENDENT_AMBULATORY_CARE_PROVIDER_SITE_OTHER): Payer: Medicare PPO

## 2022-04-14 ENCOUNTER — Ambulatory Visit (INDEPENDENT_AMBULATORY_CARE_PROVIDER_SITE_OTHER): Payer: Medicare PPO

## 2022-04-14 DIAGNOSIS — S8001XA Contusion of right knee, initial encounter: Secondary | ICD-10-CM | POA: Diagnosis not present

## 2022-04-14 DIAGNOSIS — W19XXXA Unspecified fall, initial encounter: Secondary | ICD-10-CM | POA: Diagnosis not present

## 2022-04-14 DIAGNOSIS — R0781 Pleurodynia: Secondary | ICD-10-CM

## 2022-04-14 DIAGNOSIS — S20211A Contusion of right front wall of thorax, initial encounter: Secondary | ICD-10-CM

## 2022-04-14 DIAGNOSIS — D649 Anemia, unspecified: Secondary | ICD-10-CM | POA: Diagnosis not present

## 2022-04-14 DIAGNOSIS — Z043 Encounter for examination and observation following other accident: Secondary | ICD-10-CM | POA: Diagnosis not present

## 2022-04-14 LAB — HEPATIC FUNCTION PANEL
ALT: 30 U/L (ref 0–35)
AST: 25 U/L (ref 0–37)
Albumin: 4.1 g/dL (ref 3.5–5.2)
Alkaline Phosphatase: 51 U/L (ref 39–117)
Bilirubin, Direct: 0.1 mg/dL (ref 0.0–0.3)
Total Bilirubin: 0.4 mg/dL (ref 0.2–1.2)
Total Protein: 6.6 g/dL (ref 6.0–8.3)

## 2022-04-14 LAB — VITAMIN B12: Vitamin B-12: 366 pg/mL (ref 211–911)

## 2022-04-14 NOTE — Addendum Note (Signed)
Addended by: Neta Ehlers on: 04/14/2022 11:47 AM   Modules accepted: Orders

## 2022-04-14 NOTE — Addendum Note (Signed)
Addended by: Neta Ehlers on: 04/14/2022 11:46 AM   Modules accepted: Orders

## 2022-04-15 DIAGNOSIS — D649 Anemia, unspecified: Secondary | ICD-10-CM | POA: Diagnosis not present

## 2022-04-16 LAB — PROTEIN ELECTROPHORESIS, SERUM
Albumin ELP: 4.1 g/dL (ref 3.8–4.8)
Alpha 1: 0.3 g/dL (ref 0.2–0.3)
Alpha 2: 0.7 g/dL (ref 0.5–0.9)
Beta 2: 0.4 g/dL (ref 0.2–0.5)
Beta Globulin: 0.4 g/dL (ref 0.4–0.6)
Gamma Globulin: 0.7 g/dL — ABNORMAL LOW (ref 0.8–1.7)
Total Protein: 6.6 g/dL (ref 6.1–8.1)

## 2022-04-20 LAB — IFE AND PE, RANDOM URINE
% BETA, Urine: 39.3 %
ALBUMIN, U: 40 %
ALPHA 1 URINE: 4.2 %
ALPHA-2-GLOBULIN, U: 8.9 %
GAMMA GLOBULIN URINE: 7.6 %
Protein, Ur: 25.6 mg/dL

## 2022-04-20 NOTE — Progress Notes (Signed)
No new fracture noted.  Multiple old healed bilateral rib fractures.

## 2022-04-20 NOTE — Progress Notes (Signed)
The x-rays did not show any fracture.

## 2022-04-21 ENCOUNTER — Encounter: Payer: Self-pay | Admitting: Internal Medicine

## 2022-04-21 DIAGNOSIS — D649 Anemia, unspecified: Secondary | ICD-10-CM | POA: Insufficient documentation

## 2022-04-23 DIAGNOSIS — D649 Anemia, unspecified: Secondary | ICD-10-CM | POA: Diagnosis not present

## 2022-04-23 NOTE — Addendum Note (Signed)
Addended by: Neta Ehlers on: 04/23/2022 11:48 AM   Modules accepted: Orders

## 2022-04-26 ENCOUNTER — Telehealth: Payer: Self-pay | Admitting: *Deleted

## 2022-04-26 ENCOUNTER — Telehealth: Payer: Self-pay

## 2022-04-26 LAB — ERYTHROPOIETIN: Erythropoietin: 17 m[IU]/mL (ref 2.6–18.5)

## 2022-04-26 MED ORDER — IRON (FERROUS SULFATE) 325 (65 FE) MG PO TABS: 325.0000 mg | ORAL_TABLET | Freq: Every day | ORAL | 0 refills | Status: DC

## 2022-04-26 NOTE — Telephone Encounter (Signed)
LM FOR PT TO [facility] ::  Crecencio Mc, MD  Adair Laundry, CMA Her anemia may respond to iron supplementation.  Sending a supplement to pharmacy to take [place].   Repeat cbc and iron in 6 weeks

## 2022-04-26 NOTE — Telephone Encounter (Signed)
$  0 due; PA required-Pt scheduled for 05/24/22  Sent to PA team for approval

## 2022-04-26 NOTE — Addendum Note (Signed)
Addended by: Crecencio Mc on: 04/26/2022 12:50 PM   Modules accepted: Orders

## 2022-04-27 ENCOUNTER — Other Ambulatory Visit (HOSPITAL_COMMUNITY): Payer: Self-pay

## 2022-04-27 NOTE — Telephone Encounter (Signed)
Pharmacy Patient Advocate Encounter   Received notification that prior authorization for Prolia '60mg'$  is required/requested.  Per Test Claim: $64.00 with pharmacy benefit   PA submitted on 04/27/22 to ([place] via Cover[place]eds [place] or [facility]) confirmation # [id number] Status is pending

## 2022-04-28 NOTE — Telephone Encounter (Signed)
Aware and appt is scheduled.

## 2022-04-28 NOTE — Telephone Encounter (Signed)
Pt ready for scheduling for Prolia on or after : 05/18/2022  Out-of-pocket cost due at time of visit: $0  Primary: [employer] - Medicare Prolia co-insurance: 0% Admin fee co-insurance: 0%  Secondary: N/A Prolia co-insurance:  Admin fee co-insurance:   Medical Benefit Details: Date Benefits were checked: 04/07/2022 Deductible: no/ Coinsurance: 0%/ Admin Fee: 0%  Prior Auth: approved PA# [id number] Expiration Date: 02/15/2023   Pharmacy benefit: Copay $64.00 If patient wants fill through the pharmacy benefit please send prescription to:  [facility] , and include estimated need by date in rx notes. Pharmacy will ship medication directly to the office.  Patient NOT eligible for Prolia Copay Card. Copay Card can make patient's cost as little as $25. Link to apply: [email or web address]://[email or web address]  Prolia approval letter attached to chart.   *Prolia and administration will be covered at 100% after the OOP is met $4,000 out of pocket max ($20.00 met).  ** This summary of benefits is an estimation of the patient's out-of-pocket cost. Exact cost may very based on individual plan coverage.

## 2022-04-28 NOTE — Telephone Encounter (Signed)
Patient Advocate Encounter  Prior Authorization for Prolia '60mg'$  has been approved.    PA# [id number] Effective dates: 04/10/19 through 02/15/23

## 2022-05-24 ENCOUNTER — Ambulatory Visit (INDEPENDENT_AMBULATORY_CARE_PROVIDER_SITE_OTHER): Payer: Medicare PPO

## 2022-05-24 DIAGNOSIS — M810 Age-related osteoporosis without current pathological fracture: Secondary | ICD-10-CM

## 2022-05-24 MED ORDER — DENOSUMAB 60 MG/ML ~~LOC~~ SOSY
60.0000 mg | PREFILLED_SYRINGE | Freq: Once | SUBCUTANEOUS | Status: AC
  Administered 2022-05-24: 60 mg via SUBCUTANEOUS

## 2022-05-24 NOTE — Progress Notes (Signed)
Pt presented for their subcutaneous Prolia injection. Pt was identified through two identifiers. Pt was given the information packets about the Prolia and told to schedule their next injection 6 months out. Pt tolerated the subq injection well in the left arm.  

## 2022-06-07 ENCOUNTER — Other Ambulatory Visit (INDEPENDENT_AMBULATORY_CARE_PROVIDER_SITE_OTHER): Payer: Medicare PPO

## 2022-06-07 DIAGNOSIS — D649 Anemia, unspecified: Secondary | ICD-10-CM

## 2022-06-07 LAB — CBC WITH DIFFERENTIAL/PLATELET
Basophils Absolute: 0 10*3/uL (ref 0.0–0.1)
Basophils Relative: 0.8 % (ref 0.0–3.0)
Eosinophils Absolute: 0.1 10*3/uL (ref 0.0–0.7)
Eosinophils Relative: 1.3 % (ref 0.0–5.0)
HCT: 39.1 % (ref 36.0–46.0)
Hemoglobin: 12.7 g/dL (ref 12.0–15.0)
Lymphocytes Relative: 22.9 % (ref 12.0–46.0)
Lymphs Abs: 1.5 10*3/uL (ref 0.7–4.0)
MCHC: 32.5 g/dL (ref 30.0–36.0)
MCV: 86.1 fl (ref 78.0–100.0)
Monocytes Absolute: 0.6 10*3/uL (ref 0.1–1.0)
Monocytes Relative: 9.1 % (ref 3.0–12.0)
Neutro Abs: 4.3 10*3/uL (ref 1.4–7.7)
Neutrophils Relative %: 65.9 % (ref 43.0–77.0)
Platelets: 290 10*3/uL (ref 150.0–400.0)
RBC: 4.55 Mil/uL (ref 3.87–5.11)
RDW: 22 % — ABNORMAL HIGH (ref 11.5–15.5)
WBC: 6.4 10*3/uL (ref 4.0–10.5)

## 2022-06-08 LAB — IRON,TIBC AND FERRITIN PANEL
%SAT: 22 % (calc) (ref 16–45)
Ferritin: 35 ng/mL (ref 16–288)
Iron: 70 ug/dL (ref 45–160)
TIBC: 314 mcg/dL (calc) (ref 250–450)

## 2022-06-15 ENCOUNTER — Encounter: Payer: Self-pay | Admitting: Internal Medicine

## 2022-06-15 ENCOUNTER — Ambulatory Visit: Payer: Medicare PPO | Admitting: Internal Medicine

## 2022-06-15 VITALS — BP 130/78 | HR 65 | Temp 97.5°F | Ht <= 58 in | Wt 107.2 lb

## 2022-06-15 DIAGNOSIS — R4189 Other symptoms and signs involving cognitive functions and awareness: Secondary | ICD-10-CM

## 2022-06-15 DIAGNOSIS — G3184 Mild cognitive impairment, so stated: Secondary | ICD-10-CM

## 2022-06-15 DIAGNOSIS — I10 Essential (primary) hypertension: Secondary | ICD-10-CM

## 2022-06-15 DIAGNOSIS — M810 Age-related osteoporosis without current pathological fracture: Secondary | ICD-10-CM | POA: Diagnosis not present

## 2022-06-15 DIAGNOSIS — D509 Iron deficiency anemia, unspecified: Secondary | ICD-10-CM | POA: Diagnosis not present

## 2022-06-15 LAB — TSH: TSH: 0.97 u[IU]/mL (ref 0.35–5.50)

## 2022-06-15 LAB — B12 AND FOLATE PANEL
Folate: >23.5 ng/mL (ref >5.9)
Vitamin B-12: 414 pg/mL (ref 211–911)

## 2022-06-15 NOTE — Assessment & Plan Note (Signed)
Now managed with Prolia.

## 2022-06-15 NOTE — Progress Notes (Signed)
Subjective:  Patient ID: Natalie Coleman, female    DOB: September 19, 1940  Age: 82 y.o. MRN: [id number]  CC: The primary encounter diagnosis was Mild cognitive impairment. Diagnoses of Cognitive decline, Essential hypertension, Osteoporosis, postmenopausal, and Iron deficiency anemia, unspecified iron deficiency anemia type were also pertinent to this visit.   HPI JERRIYAH LOUIS presents for  Chief Complaint  Patient presents with   Medical Management of Chronic Issues   She is accompanied by Gwen Pounds her neighbor/friend  History of fall :  evaluated Feb 26 by NP following an unwitnessed fall at home. Due to lack of appropriate footing on hardwood floor.  No acute fractures.    All injuries have healed .  She is able to rise from chair without using arms   Osteoporosis: received Prolia April 8 .last dEXA July 2022  Anemia iron deficient: resolved by recent labs .  No history of anemia   Feeling good. Friend wants to report her  decline in short term memory with confusion . Patient refuses to acknowledge and gets upset when family brings it up . There is a FH of Alzheimers in mother and in her recently deceased sister (died 2 weeks ago) .    HTN:  taking amlodipine 5 mg daily .  Home readings have been    130/80 or less       Outpatient Medications Prior to Visit  Medication Sig Dispense Refill   amLODipine (NORVASC) 5 MG tablet Take 1 tablet (5 mg total) by mouth daily. 90 tablet 0   ammonium lactate (AMLACTIN) 12 % lotion Apply 1 application topically as needed for dry skin. 400 g 0   ASHWAGANDHA PO Take 1 tablet by mouth daily.     Calcium Citrate 250 MG TABS Take 2 tablets by mouth 3 (three) times daily.     Coenzyme Q10 100 MG TABS Take 1 tablet by mouth daily.     denosumab (PROLIA) 60 MG/ML SOLN injection Inject 60 mg into the skin every 6 (six) months. Administer in upper arm, thigh, or abdomen     Iron, Ferrous Sulfate, 325 (65 Fe) MG TABS Take 325 mg by mouth daily. 90  tablet 0   Lactobacillus-Inulin ([employer]) CAPS Take 1 capsule by mouth daily. 14 capsule 0   Misc Natural Products (ADV TURMERIC CURCUMIN COMPLEX PO) Take 1 capsule by mouth daily.     Multiple Vitamins-Minerals (CENTRUM SILVER PO) Take 1 tablet by mouth daily.     vitamin C (ASCORBIC ACID) 500 MG tablet Take 500 mg by mouth daily.     Melatonin 5 MG TABS Take 1 tablet by mouth as needed. (Patient not taking: Reported on 06/15/2022)     aspirin 81 MG tablet Take 81 mg by mouth daily. (Patient not taking: Reported on 06/15/2022)     celecoxib (CELEBREX) 200 MG capsule Take 1 capsule (200 mg total) by mouth daily. (Patient not taking: Reported on 06/15/2022) 30 capsule 5   GARLIC PO Take 1 capsule by mouth daily. (Patient not taking: Reported on 06/15/2022)     VALERIAN ROOT PO Take 1 capsule by mouth as needed. (Patient not taking: Reported on 06/15/2022)     vitamin B-12 (CYANOCOBALAMIN) 1000 MCG tablet Take 5,000 mcg by mouth daily.  (Patient not taking: Reported on 06/15/2022)     No facility-administered medications prior to visit.    Review of Systems;  Patient denies headache, fevers, malaise, unintentional weight loss, skin rash, eye pain, sinus congestion and  sinus pain, sore throat, dysphagia,  hemoptysis , cough, dyspnea, wheezing, chest pain, palpitations, orthopnea, edema, abdominal pain, nausea, melena, diarrhea, constipation, flank pain, dysuria, hematuria, urinary  Frequency, nocturia, numbness, tingling, seizures,  Focal weakness, Loss of consciousness,  Tremor, insomnia, depression, anxiety, and suicidal ideation.      Objective:  BP 130/78   Pulse 65   Temp (!) 97.5 F (36.4 C) (Oral)   Ht 4\' 8"  (1.422 m)   Wt 107 lb 3.2 oz (48.6 kg)   SpO2 98%   BMI 24.03 kg/m   BP Readings from Last 3 Encounters:  06/15/22 130/78  04/09/22 118/78  03/30/22 (!) 168/82    Wt Readings from Last 3 Encounters:  06/15/22 107 lb 3.2 oz (48.6 kg)  04/09/22 108 lb (49  kg)  03/30/22 105 lb 12.8 oz (48 kg)    Physical Exam Vitals reviewed.  Constitutional:      General: She is not in acute distress.    Appearance: Normal appearance. She is normal weight. She is not ill-appearing, toxic-appearing or diaphoretic.  HENT:     Head: Normocephalic.  Eyes:     General: No scleral icterus.       Right eye: No discharge.        Left eye: No discharge.     Conjunctiva/sclera: Conjunctivae normal.  Cardiovascular:     Rate and Rhythm: Normal rate and regular rhythm.     Heart sounds: Normal heart sounds.  Pulmonary:     Effort: Pulmonary effort is normal. No respiratory distress.     Breath sounds: Normal breath sounds.  Musculoskeletal:        General: Normal range of motion.  Skin:    General: Skin is warm and dry.  Neurological:     General: No focal deficit present.     Mental Status: She is alert and oriented to person, place, and time. Mental status is at baseline.  Psychiatric:        Mood and Affect: Mood normal.        Behavior: Behavior normal.        Thought Content: Thought content normal.        Judgment: Judgment normal.    Lab Results  Component Value Date   HGBA1C 5.6 11/12/2021    Lab Results  Component Value Date   CREATININE 0.88 03/30/2022   CREATININE 0.76 11/12/2021   CREATININE 0.82 08/15/2020    Lab Results  Component Value Date   WBC 6.4 06/07/2022   HGB 12.7 06/07/2022   HCT 39.1 06/07/2022   PLT 290.0 06/07/2022   GLUCOSE 90 03/30/2022   CHOL 227 (H) 11/12/2021   TRIG 95.0 11/12/2021   HDL 62.20 11/12/2021   LDLDIRECT 143.0 11/12/2021   LDLCALC 146 (H) 11/12/2021   ALT 30 04/14/2022   AST 25 04/14/2022   NA 137 03/30/2022   K 4.1 03/30/2022   CL 103 03/30/2022   CREATININE 0.88 03/30/2022   BUN 31 (H) 03/30/2022   CO2 25 03/30/2022   TSH 0.97 06/15/2022   HGBA1C 5.6 11/12/2021   MICROALBUR 7.2 (H) 03/30/2022    Assessment & Plan:  .Mild cognitive impairment Assessment & Plan: At last visit I   recommended  more aggressive treatment of depression and chronic pain .   Her mood seems better since her daughter moved back home, and her pain is controlled with tylenol however her short term memory has declined based on reports from family.  She refuses to have an  MRI but has a FH of AD in mother and sister,  she has begun  phosphatidyl serine ,  recommended by her friend/neighbor for the last 2 weeks    Cognitive decline -     Ambulatory referral to Neurology -     B12 and Folate Panel -     RPR -     TSH  Essential hypertension Assessment & Plan: she reports compliance with medication regimen  but has an elevated reading today in office.  She is not using NSAIDs daily.  Home readings are at goal of 130/80 for patients over 70   to preserve renal function.    Osteoporosis, postmenopausal Assessment & Plan: Now managed with Prolia.    Iron deficiency anemia, unspecified iron deficiency anemia type Assessment & Plan: Resolved with iron supplementation  Lab Results  Component Value Date   WBC 6.4 06/07/2022   HGB 12.7 06/07/2022   HCT 39.1 06/07/2022   MCV 86.1 06/07/2022   PLT 290.0 06/07/2022   Lab Results  Component Value Date   IRON 70 06/07/2022   TIBC 314 06/07/2022   FERRITIN 35 06/07/2022         Follow-up: Return in about 3 months (around 09/14/2022).   Sherlene Shams, MD

## 2022-06-15 NOTE — Assessment & Plan Note (Signed)
Resolved with iron supplementation  Lab Results  Component Value Date   WBC 6.4 06/07/2022   HGB 12.7 06/07/2022   HCT 39.1 06/07/2022   MCV 86.1 06/07/2022   PLT 290.0 06/07/2022   Lab Results  Component Value Date   IRON 70 06/07/2022   TIBC 314 06/07/2022   FERRITIN 35 06/07/2022

## 2022-06-15 NOTE — Assessment & Plan Note (Addendum)
At last visit I  recommended  more aggressive treatment of depression and chronic pain .   Her mood seems better since her daughter moved back home, and her pain is controlled with tylenol however her short term memory has declined based on reports from family.  She refuses to have an MRI but has a FH of AD in mother and sister,  she has begun  phosphatidyl serine ,  recommended by her friend/neighbor for the last 2 weeks

## 2022-06-15 NOTE — Patient Instructions (Addendum)
I am referring yo to Dr Sherryll Burger for an evaluation of your memory  The blood work we are doing today to rule out deficiencies that may be affecting your memory  Your blood pressure should e 130/80 or less to be considered "controlled." Please bring your readings from home to your next visit

## 2022-06-15 NOTE — Assessment & Plan Note (Signed)
she reports compliance with medication regimen  but has an elevated reading today in office.  She is not using NSAIDs daily.  Home readings are at goal of 130/80 for patients over 70   to preserve renal function.

## 2022-06-16 LAB — RPR: RPR Ser Ql: NONREACTIVE

## 2022-06-21 ENCOUNTER — Telehealth: Payer: Self-pay | Admitting: Internal Medicine

## 2022-06-21 NOTE — Telephone Encounter (Signed)
Contacted Natalie Coleman to schedule their annual wellness visit. Call back at later date: REQ CALL BACK 07/05/22  Verlee Rossetti; Care Guide Ambulatory Clinical Support [place] [facility] Direct Dial: [phone]

## 2022-08-25 DIAGNOSIS — G243 Spasmodic torticollis: Secondary | ICD-10-CM | POA: Diagnosis not present

## 2022-08-25 DIAGNOSIS — R413 Other amnesia: Secondary | ICD-10-CM | POA: Diagnosis not present

## 2022-08-31 ENCOUNTER — Other Ambulatory Visit: Payer: Self-pay | Admitting: Neurology

## 2022-08-31 DIAGNOSIS — R413 Other amnesia: Secondary | ICD-10-CM

## 2022-09-14 ENCOUNTER — Ambulatory Visit: Payer: Medicare PPO | Admitting: Internal Medicine

## 2022-11-03 ENCOUNTER — Encounter: Payer: Self-pay | Admitting: Internal Medicine

## 2022-11-03 ENCOUNTER — Ambulatory Visit: Payer: Medicare PPO | Admitting: Internal Medicine

## 2022-11-03 VITALS — BP 120/80 | HR 82 | Temp 97.7°F | Ht <= 58 in | Wt 104.6 lb

## 2022-11-03 DIAGNOSIS — M810 Age-related osteoporosis without current pathological fracture: Secondary | ICD-10-CM | POA: Diagnosis not present

## 2022-11-03 DIAGNOSIS — G3184 Mild cognitive impairment, so stated: Secondary | ICD-10-CM | POA: Diagnosis not present

## 2022-11-03 DIAGNOSIS — Z0000 Encounter for general adult medical examination without abnormal findings: Secondary | ICD-10-CM
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
## 2022-11-03 NOTE — Assessment & Plan Note (Signed)
NO EVIDENCE OF ALZHEIMERS DISEASE BY SCREENING DONE BY SHAH.  

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
## 2022-11-03 NOTE — Assessment & Plan Note (Signed)

## 2022-11-03 NOTE — Assessment & Plan Note (Signed)
Now managed with Prolia. Marland Kitchen NEXT DOSE DUE oCT 2024.  VITAMIN D LEVEL HAS ALWAYS BEEN EXCELLENT.

## 2022-11-03 NOTE — Patient Instructions (Addendum)
Your  blood pressure is fine .  Continue taking amlodipine 5 mg once daily   You are overdue for your tetanus vaccine.  .  The Tdap (tetanus-diphtheria-whooping cough vaccine ) and Shingrix vaccines are now  COVERED BY MEDICARE if you get them at your pharmacy .   You can expect 24 hours of flu like symptoms after receiving the shingles vaccine, so plan accordingly.

## 2022-11-12 ENCOUNTER — Telehealth: Payer: Self-pay | Admitting: *Deleted

## 2022-11-12 DIAGNOSIS — E559 Vitamin D deficiency, unspecified: Secondary | ICD-10-CM

## 2022-11-12 DIAGNOSIS — M810 Age-related osteoporosis without current pathological fracture: Secondary | ICD-10-CM

## 2022-11-12 NOTE — Telephone Encounter (Signed)
Sending request to PA team for benefit verification for Prolia.   Last injection: 05/24/22.  Due on or after 11/23/2022.  Received below message from [employer] Portal

## 2022-11-16 ENCOUNTER — Ambulatory Visit (INDEPENDENT_AMBULATORY_CARE_PROVIDER_SITE_OTHER): Payer: Medicare PPO | Admitting: *Deleted

## 2022-11-16 VITALS — Ht <= 58 in | Wt 105.0 lb

## 2022-11-16 DIAGNOSIS — Z0000 Encounter for general adult medical examination without abnormal findings: Secondary | ICD-10-CM | POA: Diagnosis not present

## 2022-11-16 NOTE — Patient Instructions (Signed)
Natalie Coleman , Thank you for taking time to come for your Medicare Wellness Visit. I appreciate your ongoing commitment to your health goals. Please review the following plan we discussed and let me know if I can assist you in the future.   Referrals/Orders/Follow-Ups/Clinician Recommendations: Remember to update Tetanus, Flu and Shingles vaccines  This is a list of the screening recommended for you and due dates:  Health Maintenance  Topic Date Due   Zoster (Shingles) Vaccine (1 of 2) 10/24/1988   DTaP/Tdap/Td vaccine (2 - Tdap) 08/16/2014   Mammogram  09/04/2021   Flu Shot  Never done   Medicare Annual Wellness Visit  11/16/2023   Pneumonia Vaccine  Completed   DEXA scan (bone density measurement)  Completed   HPV Vaccine  Aged Out   COVID-19 Vaccine  Discontinued    Advanced directives: (Declined) Advance directive discussed with you today. Even though you declined this today, please call our office should you change your mind, and we can give you the proper paperwork for you to fill out.  Next Medicare Annual Wellness Visit scheduled for next year: Yes 11/18/23 @ 11:15

## 2022-11-16 NOTE — Progress Notes (Signed)
Subjective:   Natalie Coleman is a 82 y.o. female who presents for Medicare Annual (Subsequent) preventive examination.  Visit Complete: Virtual  I connected with  Natalie Coleman on 11/16/22 by a audio enabled telemedicine application and verified that I am speaking with the correct person using two identifiers.  Patient Location: Home  Provider Location: Office/Clinic  I discussed the limitations of evaluation and management by telemedicine. The patient expressed understanding and agreed to proceed.  Because this visit was a virtual/telehealth visit, some criteria may be missing or patient reported. Any vitals not documented were not able to be obtained and vitals that have been documented are patient reported.    Cardiac Risk Factors include: advanced age (>20men, >63 women);dyslipidemia;hypertension     Objective:    Today's Vitals   11/16/22 1035  Weight: 105 lb (47.6 kg)  Height: 4\' 8"  (1.422 m)   Body mass index is 23.54 kg/m.     11/16/2022   10:53 AM 06/29/2021    2:31 PM 06/24/2020    3:40 PM 06/13/2020   10:53 AM 06/13/2019    9:58 AM 06/05/2018    9:07 AM 05/03/2017    2:41 PM  Advanced Directives  Does Patient Have a Medical Advance Directive? No No No No No No No  Would patient like information on creating a medical advance directive? No - Patient declined No - Patient declined  No - Patient declined No - Patient declined No - Patient declined No - Patient declined    Current Medications (verified) Outpatient Encounter Medications as of 11/16/2022  Medication Sig   amLODipine (NORVASC) 5 MG tablet Take 1 tablet (5 mg total) by mouth daily.   ammonium lactate (AMLACTIN) 12 % lotion Apply 1 application topically as needed for dry skin.   ASHWAGANDHA PO Take 1 tablet by mouth daily.   Calcium Citrate 250 MG TABS Take 2 tablets by mouth 3 (three) times daily.   Coenzyme Q10 100 MG TABS Take 1 tablet by mouth daily.   denosumab (PROLIA) 60 MG/ML SOLN  injection Inject 60 mg into the skin every 6 (six) months. Administer in upper arm, thigh, or abdomen   Lactobacillus-Inulin (CULTURELLE DIGESTIVE HEALTH) CAPS Take 1 capsule by mouth daily.   Misc Natural Products (ADV TURMERIC CURCUMIN COMPLEX PO) Take 1 capsule by mouth daily.   Multiple Vitamins-Minerals (CENTRUM SILVER PO) Take 1 tablet by mouth daily.   vitamin C (ASCORBIC ACID) 500 MG tablet Take 500 mg by mouth daily.   donepezil (ARICEPT) 5 MG tablet Take by mouth. (Patient not taking: Reported on 11/16/2022)   Iron, Ferrous Sulfate, 325 (65 Fe) MG TABS Take 325 mg by mouth daily. (Patient not taking: Reported on 11/16/2022)   Melatonin 5 MG TABS Take 1 tablet by mouth as needed. (Patient not taking: Reported on 11/16/2022)   No facility-administered encounter medications on file as of 11/16/2022.    Allergies (verified) Dexamethasone   History: Past Medical History:  Diagnosis Date   Colon polyps    GERD (gastroesophageal reflux disease)    Hemorrhoids    History of blood transfusion    Osteoporosis    Past Surgical History:  Procedure Laterality Date   CESAREAN SECTION  1610,9604   CHOLECYSTECTOMY  2008   COLONOSCOPY  2009   COLONOSCOPY WITH PROPOFOL N/A 12/10/2015   Procedure: COLONOSCOPY WITH PROPOFOL;  Surgeon: Earline Mayotte, MD;  Location: [facility] ENDOSCOPY;  Service: Endoscopy;  Laterality: N/A;   EYE SURGERY Right  FRACTURE SURGERY     right tibia   ORIF ANKLE FRACTURE BIMALLEOLAR  March 2006   ORIF TIBIA & FIBULA FRACTURES  March 2006   Family History  Problem Relation Age of Onset   Hypertension Father    Hypertension Mother    Hypertension Sister    Breast cancer Cousin    Cancer Neg Hx    Social History   Socioeconomic History   Marital status: Divorced    Spouse name: Not on file   Number of children: Not on file   Years of education: Not on file   Highest education level: Not on file  Occupational History   Not on file  Tobacco Use    Smoking status: Never   Smokeless tobacco: Never  Substance and Sexual Activity   Alcohol use: Yes    Alcohol/week: 1.0 standard drink of alcohol    Types: 1 Glasses of wine per week    Comment: ocassional   Drug use: No   Sexual activity: Never  Other Topics Concern   Not on file  Social History Narrative   widow   Social Determinants of Health   Financial Resource Strain: Low Risk  (11/16/2022)   Overall Financial Resource Strain (CARDIA)    Difficulty of Paying Living Expenses: Not hard at all  Food Insecurity: No Food Insecurity (11/16/2022)   Hunger Vital Sign    Worried About Running Out of Food in the Last Year: Never true    Ran Out of Food in the Last Year: Never true  Transportation Needs: No Transportation Needs (11/16/2022)   PRAPARE - Administrator, Civil Service (Medical): No    Lack of Transportation (Non-Medical): No  Physical Activity: Inactive (11/16/2022)   Exercise Vital Sign    Days of Exercise per Week: 0 days    Minutes of Exercise per Session: 0 min  Stress: No Stress Concern Present (11/16/2022)   [employer] of Occupational Health - Occupational Stress Questionnaire    Feeling of Stress : Not at all  Social Connections: Moderately Integrated (11/16/2022)   Social Connection and Isolation Panel [NHANES]    Frequency of Communication with Friends and Family: More than three times a week    Frequency of Social Gatherings with Friends and Family: Twice a week    Attends Religious Services: More than 4 times per year    Active Member of [employer] or Organizations: Yes    Attends Engineer, structural: More than 4 times per year    Marital Status: Divorced    Tobacco Counseling Counseling given: Not Answered   Clinical Intake:  Pre-visit preparation completed: Yes  Pain : No/denies pain     BMI - recorded: 23.54 Nutritional Status: BMI of 19-24  Normal Nutritional Risks: None Diabetes: No  How often do you need to have  someone help you when you read instructions, pamphlets, or other written materials from your doctor or pharmacy?: 1 - Never  Interpreter Needed?: No  Information entered by :: R. Dereona Kolodny LPN   Activities of Daily Living    11/16/2022   10:37 AM  In your present state of health, do you have any difficulty performing the following activities:  Hearing? 0  Vision? 0  Comment glasses  Difficulty concentrating or making decisions? 0  Walking or climbing stairs? 1  Comment with stairs  Dressing or bathing? 0  Doing errands, shopping? 0  Preparing Food and eating ? N  Using the Toilet? N  In the past six months, have you accidently leaked urine? N  Do you have problems with loss of bowel control? N  Managing your Medications? N  Managing your Finances? N  Housekeeping or managing your Housekeeping? N    Patient Care Team: Sherlene Shams, MD as PCP - General (Internal Medicine) Lemar Livings Merrily Pew, MD (General Surgery) Sherlene Shams, MD (Internal Medicine)  Indicate any recent Medical Services you may have received from other than [facility] providers in the past year (date may be approximate).     Assessment:   This is a routine wellness examination for Natalie Coleman.  Hearing/Vision screen Hearing Screening - Comments:: No issues Vision Screening - Comments:: glasses   Goals Addressed             This Visit's Progress    Patient Stated       Wants to go out more and walk more       Depression Screen    11/16/2022   10:48 AM 04/09/2022   10:01 AM 01/25/2022    4:27 PM 11/16/2021   10:19 AM 06/29/2021    2:30 PM 09/17/2020    9:20 AM 08/15/2020   11:37 AM  PHQ 2/9 Scores  PHQ - 2 Score 1 0 1 4 1 2 5   PHQ- 9 Score 1   8  6 15     Fall Risk    11/16/2022   10:40 AM 06/15/2022   11:45 AM 04/09/2022   10:00 AM 03/30/2022    2:18 PM 01/25/2022    4:27 PM  Fall Risk   Falls in the past year? 0 1 1 0 0  Number falls in past yr: 0 0 0 0   Injury with Fall? 0 1 1 0   Risk for  fall due to : No Fall Risks History of fall(s) No Fall Risks No Fall Risks No Fall Risks  Follow up Falls prevention discussed;Falls evaluation completed Falls evaluation completed Falls evaluation completed Falls evaluation completed Falls evaluation completed    MEDICARE RISK AT HOME: Medicare Risk at Home Any stairs in or around the home?: No If so, are there any without handrails?: No Home free of loose throw rugs in walkways, pet beds, electrical cords, etc?: Yes Adequate lighting in your home to reduce risk of falls?: Yes Life alert?: No Use of a cane, walker or w/c?: No Grab bars in the bathroom?: No Shower chair or bench in shower?: Yes Elevated toilet seat or a handicapped toilet?: No     Cognitive Function:    12/30/2014    9:34 AM  MMSE - Mini Mental State Exam  Orientation to time 5  Orientation to Place 5  Registration 3  Attention/ Calculation 5  Recall 3  Language- name 2 objects 2  Language- repeat 1  Language- follow 3 step command 3  Language- read & follow direction 1  Write a sentence 1  Copy design 1  Total score 30        11/16/2022   10:53 AM 06/29/2021    2:39 PM 06/13/2020   11:02 AM 06/13/2019   10:07 AM 06/05/2018    9:18 AM  6CIT Screen  What Year? 0 points 0 points 0 points 0 points 0 points  What month? 3 points 0 points 0 points 0 points 0 points  What time? 0 points 0 points 0 points 0 points 0 points  Count back from 20 2 points 0 points 0 points  0 points  Months in reverse 4 points 0 points 0 points  0 points  Repeat phrase 8 points  10 points  0 points  Total Score 17 points  10 points  0 points    Immunizations Immunization History  Administered Date(s) Administered   Pneumococcal Conjugate-13 08/15/2012   Pneumococcal Polysaccharide-23 08/17/2004, 01/13/2015   Td 08/15/2004   Zoster, Live 01/18/2014    TDAP status: Due, Education has been provided regarding the importance of this vaccine. Advised may receive this vaccine  at local pharmacy or Health Dept. Aware to provide a copy of the vaccination record if obtained from local pharmacy or Health Dept. Verbalized acceptance and understanding.  Flu Vaccine status: Due, Education has been provided regarding the importance of this vaccine. Advised may receive this vaccine at local pharmacy or Health Dept. Aware to provide a copy of the vaccination record if obtained from local pharmacy or Health Dept. Verbalized acceptance and understanding.  Pneumococcal vaccine status: Up to date  Covid-19 vaccine status: Information provided on how to obtain vaccines.   Qualifies for Shingles Vaccine? Yes   Zostavax completed Yes   Shingrix Completed?: No.    Education has been provided regarding the importance of this vaccine. Patient has been advised to call insurance company to determine out of pocket expense if they have not yet received this vaccine. Advised may also receive vaccine at local pharmacy or Health Dept. Verbalized acceptance and understanding.  Screening Tests Health Maintenance  Topic Date Due   Zoster Vaccines- Shingrix (1 of 2) 10/24/1988   DTaP/Tdap/Td (2 - Tdap) 08/16/2014   MAMMOGRAM  09/04/2021   Medicare Annual Wellness (AWV)  06/30/2022   INFLUENZA VACCINE  Never done   Pneumonia Vaccine 24+ Years old  Completed   DEXA SCAN  Completed   HPV VACCINES  Aged Out   COVID-19 Vaccine  Discontinued    Health Maintenance  Health Maintenance Due  Topic Date Due   Zoster Vaccines- Shingrix (1 of 2) 10/24/1988   DTaP/Tdap/Td (2 - Tdap) 08/16/2014   MAMMOGRAM  09/04/2021   Medicare Annual Wellness (AWV)  06/30/2022   INFLUENZA VACCINE  Never done    Colorectal cancer screening: No longer required.   Mammogram status: No longer required due to age.  Bone Density status: Completed 08/2020. Results reflect: Bone density results: OSTEOPOROSIS. Repeat every 2 years. Wants to discuss with PCP  Lung Cancer Screening: (Low Dose CT Chest recommended if  Age 63-80 years, 20 pack-year currently smoking OR have quit w/in 15years.) does not qualify.     Additional Screening:  Hepatitis C Screening: does not qualify; Completed NAage  Vision Screening: Recommended annual ophthalmology exams for early detection of glaucoma and other disorders of the eye. Is the patient up to date with their annual eye exam?  Yes  Who is the provider or what is the name of the office in which the patient attends annual eye exams? [facility] If pt is not established with a provider, would they like to be referred to a provider to establish care? No .   Dental Screening: Recommended annual dental exams for proper oral hygiene    Community Resource Referral / Chronic Care Management: CRR required this visit?  No   CCM required this visit?  No     Plan:     I have personally reviewed and noted the following in the patient's chart:   Medical and social history Use of alcohol, tobacco or illicit drugs  Current medications and supplements including  opioid prescriptions. Patient is not currently taking opioid prescriptions. Functional ability and status Nutritional status Physical activity Advanced directives List of other physicians Hospitalizations, surgeries, and ER visits in previous 12 months Vitals Screenings to include cognitive, depression, and falls Referrals and appointments  In addition, I have reviewed and discussed with patient certain preventive protocols, quality metrics, and best practice recommendations. A written personalized care plan for preventive services as well as general preventive health recommendations were provided to patient.     Sydell Axon, LPN   47/09/2954   After Visit Summary: (Pick Up) Due to this being a telephonic visit, with patients personalized plan was offered to patient and patient has requested to Pick up at office.  Nurse Notes: None

## 2022-11-17 NOTE — Telephone Encounter (Signed)
Pt scheduled for next Prolia on 11/29/22  Awaiting benefit verification & prior auth

## 2022-11-26 NOTE — Telephone Encounter (Signed)
3rd request-pt has appt for Prolia on Monday 11/29/23 @ 11:45am.

## 2022-11-29 ENCOUNTER — Telehealth: Payer: Self-pay

## 2022-11-29 ENCOUNTER — Other Ambulatory Visit (HOSPITAL_COMMUNITY): Payer: Self-pay

## 2022-11-29 ENCOUNTER — Ambulatory Visit: Payer: Medicare PPO

## 2022-11-29 NOTE — Telephone Encounter (Signed)
Pt ready for scheduling for Prolia on or after : 11/29/22  Out-of-pocket cost due at time of visit: $20  Primary: [employer] - Medicare Prolia co-insurance: $20 Admin fee co-insurance: $0  Secondary: N/A Prolia co-insurance:  Admin fee co-insurance:   Medical Benefit Details: Date Benefits were checked: 11/29/22 Deductible: no/ Coinsurance: $20/ Admin Fee: $0  Prior Auth: Approved PA# [id number] Expiration Date: 02/15/2023  # of doses approved:  Pharmacy benefit: Copay $64 If patient wants fill through the pharmacy benefit please send prescription to:  [facility] , and include estimated need by date in rx notes. Pharmacy will ship medication directly to the office.  Patient not eligible for Prolia Copay Card. Copay Card can make patient's cost as little as $25. Link to apply: [email or web address]://www.amgensupportplus.com/copay  ** This summary of benefits is an estimation of the patient's out-of-pocket cost. Exact cost may very based on individual plan coverage.

## 2022-11-29 NOTE — Telephone Encounter (Signed)
Noted, thanks!

## 2022-11-30 NOTE — Telephone Encounter (Signed)
Patient just called asking about her Prolia shot. I scheduled her an appointment on October 17 th at 2:30.

## 2022-12-01 NOTE — Telephone Encounter (Signed)
Pt missed appt on 11/29/22. She was already scheduled.

## 2022-12-02 ENCOUNTER — Ambulatory Visit: Payer: Medicare PPO

## 2022-12-02 DIAGNOSIS — M810 Age-related osteoporosis without current pathological fracture: Secondary | ICD-10-CM | POA: Diagnosis not present

## 2022-12-02 MED ORDER — DENOSUMAB 60 MG/ML ~~LOC~~ SOSY
60.0000 mg | PREFILLED_SYRINGE | Freq: Once | SUBCUTANEOUS | Status: AC
Start: 2022-12-02 — End: 2022-12-02
  Administered 2022-12-02: 60 mg via SUBCUTANEOUS

## 2022-12-02 NOTE — Progress Notes (Signed)
Pt presented today for a Prolia injection. Left arm, SQ. Pt voiced no concerns nor showed any signs of distress during injection.

## 2023-03-21 ENCOUNTER — Ambulatory Visit: Payer: Medicare PPO | Admitting: Internal Medicine

## 2023-03-21 ENCOUNTER — Ambulatory Visit: Payer: Self-pay | Admitting: Internal Medicine

## 2023-03-21 NOTE — Telephone Encounter (Signed)
Chief Complaint: Anxiety  Symptoms: Anxiety, stomach ache,  Frequency: comes and goes Pertinent Negatives: Patient denies current stomach ache, pain, fever, nausea, vomiting, diarrhea. Denies thoughts of self harm or harm to others.  Disposition: [] ED /[] Urgent Care (no appt availability in office) / [x] Appointment(In office/virtual)/ []  [facility]/ [] Home Care/ [] Refused Recommended Disposition /[] [facility]/ []  Follow-up with PCP Additional Notes: Patient states she had an upset stomach for a few days that has gotten better, but she spoke with her son and he advised her to be seen by her provider to make sure she is okay. Patient is very tearful on the call. Patient states that she is scared because her son is making a big deal about this even though she feels better. Care advice was given and patient has been scheduled for evaluation by PCP on 03/23/23. Advised patient if stomach symptoms return to callback.   Copied from CRM [id number]. Topic: Clinical - Red Word Triage >> Mar 21, 2023 12:31 PM Samuel Jester B wrote: Red Word that prompted transfer to Nurse Triage: Pt stated that she is not doing well, she is having stomach issues. She stated that ther stomach isn't hurting today or vomiting and says she does not have any symptoms but his hysterically crying. Reason for Disposition  [1] Anxiety symptoms AND [2] has not been evaluated for this by doctor (or NP/PA)  Answer Assessment - Initial Assessment Questions 1. CONCERN: "Did anything happen that prompted you to call today?"      I had some stomach problems but they are better now  2. ANXIETY SYMPTOMS: "Can you describe how you (your loved one; patient) have been feeling?" (e.g., tense, restless, panicky, anxious, keyed up, overwhelmed, sense of impending doom).      Patient is crying over the phone  3. ONSET: "How long have you been feeling this way?" (e.g., hours, days, weeks)     Today  4. SEVERITY: "How would you rate the  level of anxiety?" (e.g., 0 - 10; or mild, moderate, severe).     Mild to Moderate  5. FUNCTIONAL IMPAIRMENT: "How have these feelings affected your ability to do daily activities?" "Have you had more difficulty than usual doing your normal daily activities?" (e.g., getting better, same, worse; self-care, school, work, interactions)     I can't stop crying right now 6. HISTORY: "Have you felt this way before?" "Have you ever been diagnosed with an anxiety problem in the past?" (e.g., generalized anxiety disorder, panic attacks, PTSD). If Yes, ask: "How was this problem treated?" (e.g., medicines, counseling, etc.)     Anxiety  7. RISK OF HARM - SUICIDAL IDEATION: "Do you ever have thoughts of hurting or killing yourself?" If Yes, ask:  "Do you have these feelings now?" "Do you have a plan on how you would do this?"     No  8. TREATMENT:  "What has been done so far to treat this anxiety?" (e.g., medicines, relaxation strategies). "What has helped?"     I just talk to my family  9. TREATMENT - THERAPIST: "Do you have a counselor or therapist? Name?"     No  10. POTENTIAL TRIGGERS: "Do you drink caffeinated beverages (e.g., coffee, colas, teas), and how much daily?" "Do you drink alcohol or use any drugs?" "Have you started any new medicines recently?"       No 11. PATIENT SUPPORT: "Who is with you now?" "Who do you live with?" "Do you have family or friends who you  can talk to?"        Yes, my son 17. OTHER SYMPTOMS: "Do you have any other symptoms?" (e.g., feeling depressed, trouble concentrating, trouble sleeping, trouble breathing, palpitations or fast heartbeat, chest pain, sweating, nausea, or diarrhea)       Stomach ache  Protocols used: Anxiety and Panic Attack-A-AH

## 2023-03-21 NOTE — Telephone Encounter (Signed)
 noted

## 2023-03-23 ENCOUNTER — Telehealth: Payer: Self-pay

## 2023-03-23 ENCOUNTER — Encounter: Payer: Self-pay | Admitting: Internal Medicine

## 2023-03-23 ENCOUNTER — Ambulatory Visit: Payer: Medicare PPO | Admitting: Internal Medicine

## 2023-03-23 VITALS — BP 164/88 | HR 84 | Ht <= 58 in | Wt 104.2 lb

## 2023-03-23 DIAGNOSIS — G3184 Mild cognitive impairment, so stated: Secondary | ICD-10-CM

## 2023-03-23 DIAGNOSIS — I10 Essential (primary) hypertension: Secondary | ICD-10-CM

## 2023-03-23 DIAGNOSIS — F32A Depression, unspecified: Secondary | ICD-10-CM

## 2023-03-23 NOTE — Telephone Encounter (Signed)
 Copied from CRM [id number]. Topic: General - Other >> Mar 23, 2023 12:35 PM Viola F wrote: Reason for CRM: Patient daughter [name] called, says patient has appointment today 2/5 @ 4:45pm and wants to giver the doctor a heads up about how patient has been doing. Patient has been vomiting 3x a week for the past 2 1/2 months, having memory loss, only drinks enough water to take down pills and doesn't drink water after that only sodas and coffee. Daughter [name] request a phone call back with update regarding patient appointment

## 2023-03-23 NOTE — Patient Instructions (Addendum)
 I want you to drink 32 ounces of water daily as your first goal(Week One)   After one week,  increase your goal to 40 ounces of water daily  I think going for a walk every day would be GREAT for  your anxiety   YOU NEED TO TAKE YOUR AMLODIPINE  EVERY DAY .  USE A PILL BOX  TO MAKE SURE YOU ARE TAKING IT CORRECTLY   I WANT YOU TO RETURN FOR A BLOOD PRESSURE CHECK AFTER YOU HAVE BEEN TAKING YOUR AMLODIPINE  DAILY FOR AT LEAST A WEEK
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
## 2023-03-24 NOTE — Assessment & Plan Note (Signed)
 She was evaluated by neurology in 2023 and has no evidence of  ALZHEIMERS DISEASE BY SCREENING DONE BY [facility] whose opinion was that the etiology o her CGI is more likely RELATED TO REMOTE HEAD INJURY .  She has not progressed by my evaluation;  her untreated depression and anxiety are more concerning and likely aggravating her dysfunction and apathy .

## 2023-03-24 NOTE — Assessment & Plan Note (Signed)
 Her symptoms are aggravated by loneliness and lack of social opportunities.  She is  extremely resistant to the idea of moving to an assisted living facility  and would not engage in the discussion at this time.  I have recommended that she return to her church activities,  or consider volunteering at the hospital e.

## 2023-03-24 NOTE — Assessment & Plan Note (Signed)
 She was reminded of the risk of noncompliance with treatment of hypertension and was able to state the consequences of untreated hypertesnion

## 2023-03-31 ENCOUNTER — Ambulatory Visit: Payer: Medicare PPO | Admitting: Internal Medicine

## 2023-04-01 ENCOUNTER — Ambulatory Visit: Payer: Medicare PPO | Admitting: Family

## 2023-04-01 ENCOUNTER — Encounter: Payer: Self-pay | Admitting: Family

## 2023-04-01 ENCOUNTER — Ambulatory Visit: Payer: Medicare PPO

## 2023-04-01 VITALS — BP 130/78 | HR 80 | Temp 98.1°F | Ht <= 58 in | Wt 100.4 lb

## 2023-04-01 DIAGNOSIS — R110 Nausea: Secondary | ICD-10-CM

## 2023-04-01 DIAGNOSIS — F32A Depression, unspecified: Secondary | ICD-10-CM

## 2023-04-01 DIAGNOSIS — R899 Unspecified abnormal finding in specimens from other organs, systems and tissues: Secondary | ICD-10-CM

## 2023-04-01 DIAGNOSIS — I10 Essential (primary) hypertension: Secondary | ICD-10-CM | POA: Diagnosis not present

## 2023-04-01 DIAGNOSIS — R112 Nausea with vomiting, unspecified: Secondary | ICD-10-CM | POA: Insufficient documentation

## 2023-04-01 DIAGNOSIS — K5900 Constipation, unspecified: Secondary | ICD-10-CM | POA: Diagnosis not present

## 2023-04-01 LAB — COMPREHENSIVE METABOLIC PANEL
ALT: 40 U/L — ABNORMAL HIGH (ref 0–35)
AST: 40 U/L — ABNORMAL HIGH (ref 0–37)
Albumin: 3.8 g/dL (ref 3.5–5.2)
Alkaline Phosphatase: 64 U/L (ref 39–117)
BUN: 27 mg/dL — ABNORMAL HIGH (ref 6–23)
CO2: 27 meq/L (ref 19–32)
Calcium: 8.4 mg/dL (ref 8.4–10.5)
Chloride: 106 meq/L (ref 96–112)
Creatinine, Ser: 0.89 mg/dL (ref 0.40–1.20)
GFR: 59.53 mL/min — ABNORMAL LOW (ref >60.00)
Glucose, Bld: 81 mg/dL (ref 70–99)
Potassium: 3.7 meq/L (ref 3.5–5.1)
Sodium: 143 meq/L (ref 135–145)
Total Bilirubin: 0.3 mg/dL (ref 0.2–1.2)
Total Protein: 6.5 g/dL (ref 6.0–8.3)

## 2023-04-01 LAB — CBC WITH DIFFERENTIAL/PLATELET
Basophils Absolute: 0 10*3/uL (ref 0.0–0.1)
Basophils Relative: 0.4 % (ref 0.0–3.0)
Eosinophils Absolute: 0 10*3/uL (ref 0.0–0.7)
Eosinophils Relative: 0.1 % (ref 0.0–5.0)
HCT: 40.4 % (ref 36.0–46.0)
Hemoglobin: 13.5 g/dL (ref 12.0–15.0)
Lymphocytes Relative: 21.7 % (ref 12.0–46.0)
Lymphs Abs: 0.9 10*3/uL (ref 0.7–4.0)
MCHC: 33.5 g/dL (ref 30.0–36.0)
MCV: 92.2 fL (ref 78.0–100.0)
Monocytes Absolute: 0.7 10*3/uL (ref 0.1–1.0)
Monocytes Relative: 15.5 % — ABNORMAL HIGH (ref 3.0–12.0)
Neutro Abs: 2.6 10*3/uL (ref 1.4–7.7)
Neutrophils Relative %: 62.3 % (ref 43.0–77.0)
Platelets: 232 10*3/uL (ref 150.0–400.0)
RBC: 4.38 Mil/uL (ref 3.87–5.11)
RDW: 13.3 % (ref 11.5–15.5)
WBC: 4.2 10*3/uL (ref 4.0–10.5)

## 2023-04-01 LAB — TSH: TSH: 1.66 u[IU]/mL (ref 0.35–5.50)

## 2023-04-01 MED ORDER — FAMOTIDINE 20 MG PO TABS
20.0000 mg | ORAL_TABLET | Freq: Every day | ORAL | 1 refills | Status: DC
Start: 2023-04-01 — End: 2023-12-12

## 2023-04-01 NOTE — Assessment & Plan Note (Addendum)
Weight loss of 4 pounds.  Abdomen is soft.  Suspected underlying GERD as from what patient can recall, emesis is a small amount, nonbloody and more likely at nighttime. Start Pepcid 20 mg daily and titrate if needed.  Question of underlying constipation is playing a role as well.  Pending abdominal x-ray.

## 2023-04-01 NOTE — Patient Instructions (Addendum)
Start pepcid ac in the evening for suspected reflux.  Please focus on caloric dense foods that she enjoys. Concerned with weight loss. Concerned that depression is affecting appetite as well.    Close follow up Constipation, Adult Constipation is when a person has trouble pooping (having a bowel movement). When you have this condition, you may poop fewer than 3 times a week. Your poop (stool) may also be dry, hard, or bigger than normal. Follow these instructions at home: Eating and drinking  Eat foods that have a lot of fiber, such as: Fresh fruits and vegetables. Whole grains. Beans. Eat less of foods that are low in fiber and high in fat and sugar, such as: [place] fries. Hamburgers. Cookies. Candy. Soda. Drink enough fluid to keep your pee (urine) pale yellow. General instructions Exercise regularly or as told by your doctor. Try to do 150 minutes of exercise each week. Go to the restroom when you feel like you need to poop. Do not hold it in. Take over-the-counter and prescription medicines only as told by your doctor. These include any fiber supplements. When you poop: Do deep breathing while relaxing your lower belly (abdomen). Relax your pelvic floor. The pelvic floor is a group of muscles that support the rectum, bladder, and intestines (as well as the uterus in women). Watch your condition for any changes. Tell your doctor if you notice any. Keep all follow-up visits as told by your doctor. This is important. Contact a doctor if: You have pain that gets worse. You have a fever. You have not pooped for 4 days. You vomit. You are not hungry. You lose weight. You are bleeding from the opening of the butt (anus). You have thin, pencil-like poop. Get help right away if: You have a fever, and your symptoms suddenly get worse. You leak poop or have blood in your poop. Your belly feels hard or bigger than normal (bloated). You have very bad belly pain. You feel dizzy or  you faint. Summary Constipation is when a person poops fewer than 3 times a week, has trouble pooping, or has poop that is dry, hard, or bigger than normal. Eat foods that have a lot of fiber. Drink enough fluid to keep your pee (urine) pale yellow. Take over-the-counter and prescription medicines only as told by your doctor. These include any fiber supplements. This information is not intended to replace advice given to you by your health care provider. Make sure you discuss any questions you have with your health care provider. Document Revised: 12/16/2021 Document Reviewed: 12/16/2021 Elsevier Patient Education  2024 Elsevier Inc.  GERD in Adults: Diet Changes When you have gastroesophageal reflux disease (GERD), you may need to make changes to your diet. Choosing the right foods can help with your symptoms. Think about working with an expert in healthy eating called a dietitian. They can help you make healthy food choices. What are tips for following this plan? Reading food labels Look for foods that are low in saturated fat. Foods that may help with your symptoms include: Foods with less than 5% of daily value (DV) of fat. Foods with 0 grams of trans fat. Cooking [employer] in ways that don't use a lot of fat. These ways include: Baking. Steaming. Grilling. Broiling. To add flavor, try to use herbs that are low in spice and acidity. Avoid frying your food. Meal planning  Eat small meals often rather than eating 3 large meals each day. Eat your meals slowly in a  place where you feel relaxed. If told by your health care provider, avoid: Foods that cause symptoms. Keep a food diary to keep track of foods that cause symptoms. Alcohol. Drinking a lot of liquid with meals. General instructions For 2-3 hours after you eat, avoid: Bending over. Exercise. Lying down. Chew sugar-free gum after meals. What foods should I eat? Eat a healthy diet. Try to include: Foods with high  amounts of fiber. These include: Fruits and vegetables. Whole grains and beans. Low-fat dairy products. Lean meats, fish, and poultry. Egg whites. Foods that cause symptoms in someone else may not cause symptoms for you. Work with your provider to find foods that are safe for you. The items listed above may not be all the foods and drinks you can have. Talk with a dietitian to learn more. The items listed above may not be a complete list of foods and beverages you can eat and drink. Contact a dietitian for more information. What foods should I avoid? Limiting some of these foods may help with your symptoms. Each person is different. Talk with a dietitian or your provider to help you find the exact foods to avoid. Some of the foods to avoid may include: Fruits Fruits with a lot of acid in them. These may include citrus fruits, such as oranges, grapefruit, pineapple, and lemons. Vegetables Deep-fried vegetables, such as [place] fries. Vegetables, sauces, or toppings made with added fat and vegetables with acid in them. These may include tomatoes and tomato products, chili peppers, onions, garlic, and horseradish. Grains Pastries or quick breads with added fat. Meats and other proteins High-fat meats, such as fatty beef or pork, hot dogs, ribs, ham, sausage, salami, and bacon. Fried meat or protein, such as fried fish and fried chicken. Egg yolks. Fats and oils Butter. Margarine. Shortening. Ghee. Drinks Coffee and other drinks with caffeine in them. Fizzy and sugary drinks, such as soda and energy drinks. Fruit juice made with acidic fruits, such as orange or grapefruit. Tomato juice. Sweets and desserts Chocolate and cocoa. Donuts. Seasonings and condiments Mint, such as peppermint and spearmint. Condiments, herbs, or seasonings that cause symptoms. These may include curry, hot sauce, or vinegar-based salad dressings. The items listed above may not be all the foods and drinks you should  avoid. Talk with a dietitian to learn more. Questions to ask your health care provider Changes to your diet and everyday life are often the first steps taken to manage symptoms of GERD. If these changes don't help, talk with your provider about taking medicines. Where to find more information International Foundation for Gastrointestinal Disorders: aboutgerd.org This information is not intended to replace advice given to you by your health care provider. Make sure you discuss any questions you have with your health care provider. Document Revised: 12/14/2022 Document Reviewed: 06/30/2022 Elsevier Patient Education  2024 [employer].

## 2023-04-01 NOTE — Progress Notes (Signed)
Assessment & Plan:  Nausea Assessment & Plan: Weight loss of 4 pounds.  Abdomen is soft.  Suspected underlying GERD as from what patient can recall, emesis is a small amount, nonbloody and more likely at nighttime. Start Pepcid 20 mg daily and titrate if needed.  Question of underlying constipation is playing a role as well.  Pending abdominal x-ray.   Orders: -     Famotidine; Take 1 tablet (20 mg total) by mouth at bedtime.  Dispense: 30 tablet; Refill: 1 -     DG Abd 1 View; Future  Essential hypertension -     Comprehensive metabolic panel -     TSH -     CBC with Differential/Platelet  Mild depressive disorder Assessment & Plan: Concern in regards to lack of appetite if symptom of depression.  Discussed with patient consideration for medicine such as Remeron and/or counseling.  Patient would like to discuss this with Dr Darrick Huntsman.       Return precautions given.   Risks, benefits, and alternatives of the medications and treatment plan prescribed today were discussed, and patient expressed understanding.   Education regarding symptom management and diagnosis given to patient on AVS either electronically or printed.  No follow-ups on file.  Rennie Plowman, FNP  Subjective:    Patient ID: Natalie Coleman, female    DOB: 07-Mar-1940, 83 y.o.   MRN: [id number]  CC: Natalie Coleman is a 83 y.o. female who presents today for an acute visit.    HPI: Accompanied by daughter   Episodic vomiting x 3 months, however patient doesn't recall quantity or color at first.  Later in the visit she describes a small quantity.  Vomiting has not been witnessed by daughter.  Patient describes no blood in emesis  Symptom occurs at night. For some time, she has slept in the recliner due to chronic back pain  No sob, leg swelling, abdominal pain, diarrhea, dysuria.   She doesn't rememebr last BM, but thinks yesterday. 'I am not eating so I dont feel I need to go the bathroom'  Daughter  is concerned with loss of appetitte  Morning is coffee with crackers and cheese with a swallow of water.   No NSAIDs. No alcohol use.   Lives alone and prepares her own meals. Daughter buys food.           History of hypertension, osteoporosis, GERD   History of cholecystectomy  Colonoscopy without polyps 12/10/2015  Seen 03/23/2023 by primary in which discussed vomiting twice. Discussed mild cognitive impairment, untreated depression and anxietyuntreated depression and anxiety.  Evaluation Dr. Sherryll Burger, neurology; 01/04/2022 with no evidence of Alzheimer disease.  Allergies: Dexamethasone Current Outpatient Medications on File Prior to Visit  Medication Sig Dispense Refill   amLODipine (NORVASC) 5 MG tablet Take 1 tablet (5 mg total) by mouth daily. 90 tablet 0   ammonium lactate (AMLACTIN) 12 % lotion Apply 1 application topically as needed for dry skin. 400 g 0   ASHWAGANDHA PO Take 1 tablet by mouth daily.     Calcium Citrate 250 MG TABS Take 2 tablets by mouth 3 (three) times daily.     Coenzyme Q10 100 MG TABS Take 1 tablet by mouth daily.     denosumab (PROLIA) 60 MG/ML SOLN injection Inject 60 mg into the skin every 6 (six) months. Administer in upper arm, thigh, or abdomen     Lactobacillus-Inulin (CULTURELLE DIGESTIVE HEALTH) CAPS Take 1 capsule by mouth daily. 14 capsule 0  Misc Natural Products (ADV TURMERIC CURCUMIN COMPLEX PO) Take 1 capsule by mouth daily.     Multiple Vitamins-Minerals (CENTRUM SILVER PO) Take 1 tablet by mouth daily.     vitamin C (ASCORBIC ACID) 500 MG tablet Take 500 mg by mouth daily.     Melatonin 5 MG TABS Take 1 tablet by mouth as needed. (Patient not taking: Reported on 04/01/2023)     No current facility-administered medications on file prior to visit.    Review of Systems  Constitutional:  Positive for unexpected weight change. Negative for chills, fatigue and fever.  Respiratory:  Negative for cough.   Cardiovascular:  Negative for  chest pain and palpitations.  Gastrointestinal:  Positive for constipation and nausea. Negative for abdominal pain, diarrhea and vomiting.  Psychiatric/Behavioral:  Negative for suicidal ideas.       Objective:    BP 130/78   Pulse 80   Temp 98.1 F (36.7 C) (Oral)   Ht 4\' 6"  (1.372 m)   Wt 100 lb 6.4 oz (45.5 kg)   SpO2 98%   BMI 24.21 kg/m   BP Readings from Last 3 Encounters:  04/01/23 130/78  03/23/23 (!) 164/88  11/03/22 120/80   Wt Readings from Last 3 Encounters:  04/01/23 100 lb 6.4 oz (45.5 kg)  03/23/23 104 lb 3.2 oz (47.3 kg)  11/16/22 105 lb (47.6 kg)      03/23/2023    5:06 PM 11/16/2022   10:48 AM 04/09/2022   10:01 AM  Depression screen PHQ 2/9  Decreased Interest 0 0 0  Down, Depressed, Hopeless 0 1 0  PHQ - 2 Score 0 1 0  Altered sleeping  0   Tired, decreased energy  0   Change in appetite  0   Feeling bad or failure about yourself   0   Trouble concentrating  0   Moving slowly or fidgety/restless  0   Suicidal thoughts  0   PHQ-9 Score  1   Difficult doing work/chores  Not difficult at all     Physical Exam Vitals reviewed.  Constitutional:      Appearance: Normal appearance. She is well-developed.  Eyes:     Conjunctiva/sclera: Conjunctivae normal.  Cardiovascular:     Rate and Rhythm: Normal rate and regular rhythm.     Pulses: Normal pulses.     Heart sounds: Normal heart sounds.  Pulmonary:     Effort: Pulmonary effort is normal.     Breath sounds: Normal breath sounds. No wheezing, rhonchi or rales.  Abdominal:     General: Bowel sounds are normal. There is no distension.     Palpations: Abdomen is soft. Abdomen is not rigid. There is no fluid wave or mass.     Tenderness: There is no abdominal tenderness. There is no guarding or rebound.  Skin:    General: Skin is warm and dry.  Neurological:     Mental Status: She is alert.  Psychiatric:        Speech: Speech normal.        Behavior: Behavior normal.        Thought Content:  Thought content normal.

## 2023-04-05 NOTE — Assessment & Plan Note (Addendum)
Concern in regards to lack of appetite if symptom of depression.  Discussed with patient consideration for medicine such as Remeron and/or counseling.  Patient would like to discuss this with Dr Darrick Huntsman.

## 2023-04-07 ENCOUNTER — Telehealth: Payer: Self-pay

## 2023-04-07 NOTE — Telephone Encounter (Signed)
 Pt returned call about lab results

## 2023-04-07 NOTE — Addendum Note (Signed)
Addended by: [place], Newel Oien on: 04/07/2023 11:58 AM   Modules accepted: Orders

## 2023-04-07 NOTE — Telephone Encounter (Signed)
LVM   again on daughter phone to call back to go over message regarding results below Call pt and daughter Moderate stool burden which is indicative of constipation.  I would recommend using MiraLAX every 3 to 4  days scheduled to try to titrate bowel movements.  If this is not effective, she may then use MiraLAX every other day.  Increasing fiber, water will also help with constipation

## 2023-04-07 NOTE — Telephone Encounter (Signed)
Copied from CRM [id number]. Topic: Clinical - Lab/Test Results >> Apr 07, 2023 11:45 AM Gurney Maxin H wrote: Reason for CRM: Patients daughter states she has a missed call from clinic from today at 11:36 am but no voicemail, agent didn't see any notes as to who called, patients daughter states they were waiting on lab results, please reach back back out to daughter. Thanks.  [name] [phone]

## 2023-04-07 NOTE — Telephone Encounter (Signed)
LVM on daughter phone to inform her that I called [facility] to get xray released and labs are ordered need to schedule appt for non fasting labs for Mom in 8 weeks. Please schedule when she calls back

## 2023-04-07 NOTE — Telephone Encounter (Signed)
LVM on daughter phone again  to inform her that I called [facility] to get xray released and labs are ordered need to schedule appt for non fasting labs for Mom in 8 weeks. Please schedule when she calls back . See result note as well

## 2023-04-08 ENCOUNTER — Telehealth: Payer: Self-pay

## 2023-04-08 NOTE — Telephone Encounter (Signed)
 LMTCB

## 2023-04-08 NOTE — Telephone Encounter (Signed)
Copied from CRM [id number]. Topic: Clinical - Medical Advice >> Apr 08, 2023  2:42 PM Almira Coaster wrote: Reason for CRM: Patient daughter is calling back to ask if it's okay for patient to take Aleve back and muscle or advil instead of taking Tylenol.

## 2023-04-08 NOTE — Telephone Encounter (Signed)
Spoke to pt daughter [name] and went over Mom results,  not sure but almost certain that pt is taking Tylenol because pt does live alone, she verbalized understanding and scheduled Mom non fasting  lab appt.for 05/09/23

## 2023-04-11 NOTE — Telephone Encounter (Signed)
 noted

## 2023-04-18 ENCOUNTER — Ambulatory Visit: Payer: Medicare PPO | Admitting: Internal Medicine

## 2023-04-20 ENCOUNTER — Ambulatory Visit: Payer: Medicare PPO | Admitting: Internal Medicine

## 2023-04-24 MED ORDER — DENOSUMAB 60 MG/ML ~~LOC~~ SOSY
60.0000 mg | PREFILLED_SYRINGE | Freq: Once | SUBCUTANEOUS | Status: AC
Start: 2023-06-02 — End: 2023-06-02
  Administered 2023-06-02: 60 mg via SUBCUTANEOUS

## 2023-04-24 NOTE — Addendum Note (Signed)
 Addended by: Warden Fillers on: 04/24/2023 09:03 PM   Modules accepted: Orders

## 2023-05-04 ENCOUNTER — Ambulatory Visit: Payer: Medicare PPO | Admitting: Internal Medicine

## 2023-05-04 NOTE — Assessment & Plan Note (Deleted)
 She was reminded of the risk of noncompliance with treatment of hypertension and was able to state the consequences of untreated hypertesnion

## 2023-05-09 ENCOUNTER — Other Ambulatory Visit: Payer: Medicare PPO

## 2023-05-10 ENCOUNTER — Other Ambulatory Visit (INDEPENDENT_AMBULATORY_CARE_PROVIDER_SITE_OTHER)

## 2023-05-10 DIAGNOSIS — M810 Age-related osteoporosis without current pathological fracture: Secondary | ICD-10-CM | POA: Diagnosis not present

## 2023-05-10 DIAGNOSIS — R899 Unspecified abnormal finding in specimens from other organs, systems and tissues: Secondary | ICD-10-CM

## 2023-05-10 DIAGNOSIS — E559 Vitamin D deficiency, unspecified: Secondary | ICD-10-CM | POA: Diagnosis not present

## 2023-05-11 LAB — CBC WITH DIFFERENTIAL/PLATELET
Basophils Absolute: 0.1 10*3/uL (ref 0.0–0.1)
Basophils Relative: 1.1 % (ref 0.0–3.0)
Eosinophils Absolute: 0.1 10*3/uL (ref 0.0–0.7)
Eosinophils Relative: 0.8 % (ref 0.0–5.0)
HCT: 38.9 % (ref 36.0–46.0)
Hemoglobin: 13.2 g/dL (ref 12.0–15.0)
Lymphocytes Relative: 20.6 % (ref 12.0–46.0)
Lymphs Abs: 1.7 10*3/uL (ref 0.7–4.0)
MCHC: 34 g/dL (ref 30.0–36.0)
MCV: 92.4 fl (ref 78.0–100.0)
Monocytes Absolute: 0.7 10*3/uL (ref 0.1–1.0)
Monocytes Relative: 8.8 % (ref 3.0–12.0)
Neutro Abs: 5.6 10*3/uL (ref 1.4–7.7)
Neutrophils Relative %: 68.7 % (ref 43.0–77.0)
Platelets: 234 10*3/uL (ref 150.0–400.0)
RBC: 4.21 Mil/uL (ref 3.87–5.11)
RDW: 13.6 % (ref 11.5–15.5)
WBC: 8.1 10*3/uL (ref 4.0–10.5)

## 2023-05-11 LAB — HEPATIC FUNCTION PANEL
ALT: 9 U/L (ref 0–35)
AST: 16 U/L (ref 0–37)
Albumin: 4 g/dL (ref 3.5–5.2)
Alkaline Phosphatase: 51 U/L (ref 39–117)
Bilirubin, Direct: 0.1 mg/dL (ref 0.0–0.3)
Total Bilirubin: 0.4 mg/dL (ref 0.2–1.2)
Total Protein: 6.6 g/dL (ref 6.0–8.3)

## 2023-05-12 LAB — VITAMIN D 25 HYDROXY (VIT D DEFICIENCY, FRACTURES): VITD: 37.28 ng/mL (ref 30.00–100.00)

## 2023-06-02 ENCOUNTER — Ambulatory Visit (INDEPENDENT_AMBULATORY_CARE_PROVIDER_SITE_OTHER)

## 2023-06-02 DIAGNOSIS — M810 Age-related osteoporosis without current pathological fracture: Secondary | ICD-10-CM

## 2023-06-02 MED ORDER — DENOSUMAB 60 MG/ML ~~LOC~~ SOSY: 60.0000 mg | PREFILLED_SYRINGE | Freq: Once | SUBCUTANEOUS | Status: DC

## 2023-06-02 MED ORDER — DENOSUMAB 60 MG/ML ~~LOC~~ SOSY
60.0000 mg | PREFILLED_SYRINGE | Freq: Once | SUBCUTANEOUS | Status: AC
  Administered 2023-12-12: 60 mg via SUBCUTANEOUS

## 2023-06-02 NOTE — Progress Notes (Signed)
 Pt presented today for Prolia injection. Left deltoid, SQ. Pt voiced no concerns nor showed any signs of distress during injection.

## 2023-07-05 ENCOUNTER — Encounter: Payer: Self-pay | Admitting: Internal Medicine

## 2023-11-02 ENCOUNTER — Telehealth: Payer: Self-pay

## 2023-11-02 NOTE — Telephone Encounter (Signed)
 Prolia  VOB initiated via [email or web address]  Next Prolia  inj DUE: 12/02/23

## 2023-11-03 ENCOUNTER — Other Ambulatory Visit (HOSPITAL_COMMUNITY): Payer: Self-pay

## 2023-11-03 NOTE — Telephone Encounter (Signed)
 BUY AND BILL PA SUBMITTED VIA LATENT. KEY: [id number]    PHARMACY COPAY: $64

## 2023-11-03 NOTE — Telephone Encounter (Signed)
 SABRA

## 2023-11-04 NOTE — Telephone Encounter (Signed)
 Pt ready for scheduling for PROLIA  on or after : 12/02/23  Option# 1: Buy/Bill (Office supplied medication)  Out-of-pocket cost due at time of clinic visit: $357  Number of injection/visits approved: 2  Primary: [employer] Prolia  co-insurance: 20% Admin fee co-insurance: 20%  Secondary: --- Prolia  co-insurance:  Admin fee co-insurance:   Medical Benefit Details: Date Benefits were checked: 11/03/23 Deductible: NO/ Coinsurance: 20%/ Admin Fee: 20%  Prior Auth: APPROVED PA# [id number] Expiration Date: 04/10/19-02/15/24  # of doses approved: 2 ----------------------------------------------------------------------- Option# 2- Med Obtained from pharmacy:  Pharmacy benefit: Copay $64 (Paid to pharmacy) Admin Fee: 20% (Pay at clinic)  Prior Auth: N/A PA# Expiration Date:   # of doses approved:   If patient wants fill through the pharmacy benefit please send prescription to: [facility], and include estimated need by date in rx notes. Pharmacy will ship medication directly to the office.  Patient NOT eligible for Prolia  Copay Card. Copay Card can make patient's cost as little as $25. Link to apply: [email or web address]://www.amgensupportplus.[email or web address]/copay  ** This summary of benefits is an estimation of the patient's out-of-pocket cost. Exact cost may very based on individual plan coverage.

## 2023-11-10 ENCOUNTER — Other Ambulatory Visit: Payer: Self-pay | Admitting: Pharmacy Technician

## 2023-11-10 ENCOUNTER — Other Ambulatory Visit (HOSPITAL_COMMUNITY): Payer: Self-pay

## 2023-11-10 ENCOUNTER — Other Ambulatory Visit: Payer: Self-pay | Admitting: *Deleted

## 2023-11-10 ENCOUNTER — Other Ambulatory Visit: Payer: Self-pay

## 2023-11-10 DIAGNOSIS — M810 Age-related osteoporosis without current pathological fracture: Secondary | ICD-10-CM

## 2023-11-10 MED ORDER — DENOSUMAB 60 MG/ML ~~LOC~~ SOSY
60.0000 mg | PREFILLED_SYRINGE | SUBCUTANEOUS | 1 refills | Status: AC
  Filled 2023-11-10 – 2023-12-06 (×2): qty 1, 180d supply, fill #0

## 2023-11-10 NOTE — Progress Notes (Signed)
 Pharmacy Patient Advocate Encounter  Insurance verification completed.   The patient is insured through [employer]   Ran test claim for Prolia. Co-pay is $64.  This test claim was processed through [facility]- copay amounts may vary at other pharmacies due to pharmacy/plan contracts, or as the patient moves through the different stages of their insurance plan.

## 2023-11-14 ENCOUNTER — Other Ambulatory Visit: Payer: Self-pay

## 2023-11-17 ENCOUNTER — Other Ambulatory Visit: Payer: Self-pay

## 2023-11-18 ENCOUNTER — Telehealth: Payer: Self-pay | Admitting: *Deleted

## 2023-11-18 NOTE — Telephone Encounter (Signed)
 Called to perform AWV.  Patient stated that she was not aware of the visit. Patient stated that she does not want to do the visit without her daughter being there. Spoke to patient's daughter Natalie Coleman and appointment was rescheduled.     Patient last saw Dr. Marylynn 03/23/23 and has missed several appointments.  Spoke to patient's daughter and was advised that her mom's short term memory is getting worse. Patient's daughter stated that she would need to drive her mom to an appointment with Dr. Marylynn and she needs a Monday because that is the day that she is off.

## 2023-11-25 NOTE — Telephone Encounter (Signed)
 Pt just needs an appt scheduled with Dr Marylynn.   See original message below

## 2023-12-01 ENCOUNTER — Other Ambulatory Visit: Payer: Self-pay

## 2023-12-04 DIAGNOSIS — I129 Hypertensive chronic kidney disease with stage 1 through stage 4 chronic kidney disease, or unspecified chronic kidney disease: Secondary | ICD-10-CM | POA: Diagnosis not present

## 2023-12-04 DIAGNOSIS — Z8249 Family history of ischemic heart disease and other diseases of the circulatory system: Secondary | ICD-10-CM | POA: Diagnosis not present

## 2023-12-04 DIAGNOSIS — M810 Age-related osteoporosis without current pathological fracture: Secondary | ICD-10-CM | POA: Diagnosis not present

## 2023-12-04 DIAGNOSIS — Z7982 Long term (current) use of aspirin: Secondary | ICD-10-CM | POA: Diagnosis not present

## 2023-12-04 DIAGNOSIS — M1990 Unspecified osteoarthritis, unspecified site: Secondary | ICD-10-CM | POA: Diagnosis not present

## 2023-12-04 DIAGNOSIS — N182 Chronic kidney disease, stage 2 (mild): Secondary | ICD-10-CM | POA: Diagnosis not present

## 2023-12-04 DIAGNOSIS — R011 Cardiac murmur, unspecified: Secondary | ICD-10-CM | POA: Diagnosis not present

## 2023-12-04 DIAGNOSIS — G3184 Mild cognitive impairment, so stated: Secondary | ICD-10-CM | POA: Diagnosis not present

## 2023-12-04 DIAGNOSIS — F411 Generalized anxiety disorder: Secondary | ICD-10-CM | POA: Diagnosis not present

## 2023-12-06 ENCOUNTER — Telehealth: Payer: Self-pay | Admitting: Internal Medicine

## 2023-12-06 ENCOUNTER — Other Ambulatory Visit (HOSPITAL_COMMUNITY): Payer: Self-pay

## 2023-12-06 ENCOUNTER — Other Ambulatory Visit: Payer: Self-pay

## 2023-12-06 NOTE — Telephone Encounter (Signed)
 I spoke with patient and scheduled an appointment on 12/08/2023 at 3:15pm for her to have a Prolia  injection.  I let patient know that there is a $25 copay.

## 2023-12-06 NOTE — Telephone Encounter (Signed)
 Spoke with daughter Jacquelynne) and discussed Prolia . She will call [facility] today to have them deliver injection

## 2023-12-06 NOTE — Telephone Encounter (Signed)
 Copied from CRM [id number]. Topic: Clinical - Prescription Issue >> Dec 06, 2023 11:51 AM Mia F wrote: Reason for CRM: Pt daughter [name] states the pharmacy says they have not received a rx for the denosumab  (PROLIA ) 60 MG/ML SOSY injection yet. Please contact to have delivered. [phone]

## 2023-12-06 NOTE — Telephone Encounter (Unsigned)
 Copied from CRM [id number]. Topic: Clinical - Request for Lab/Test Order >> Dec 06, 2023 11:49 AM Natalie Coleman wrote: Reason for CRM: pt called and stated someone called her about a prolia  injection & she stated she needs to schedule an appt for this. I did not see this encounter. Please call & advise.

## 2023-12-06 NOTE — Progress Notes (Signed)
 Specialty Pharmacy Initial Fill Coordination Note  Natalie Coleman is a 83 y.o. female contacted today regarding initial fill of specialty medication(s) Denosumab  (PROLIA )   Patient requested Courier to Provider Office   Delivery date: 12/07/23   Verified address: [facility] [address]   Medication will be filled on 10/21.   Patient is aware of $64 copayment.

## 2023-12-06 NOTE — Telephone Encounter (Signed)
 Pt is also due for a follow up appt with Dr. Marylynn.

## 2023-12-07 ENCOUNTER — Telehealth: Payer: Self-pay

## 2023-12-07 NOTE — Telephone Encounter (Signed)
 Okay for pt to receive injection at [facility] with Dr Marylynn on 10/27

## 2023-12-07 NOTE — Telephone Encounter (Signed)
 noted

## 2023-12-07 NOTE — Telephone Encounter (Signed)
 Patient's daughter, [name], called and I asked her if patient told her that she has been scheduled for a Prolia  injection on 12/08/2023.  [name] states she did not tell her about that.  [name] states patient is having some short term memory issues, so she asked that we please call her with any needs we have regarding scheduling patient's appointments, etc.  [name] states she will be bringing patient to her appointments, and 12/08/2023 is not really good for her.  [name] asked if it would be possible to move the appointment to Monday (12/12/2023) and combine the Prolia  injection with the follow-up appointment patient needs with Dr. Verneita Kettering.  [name] states she would like to be sure it's ok for Prolia  to sit in the refrigerator that long.  I let [name] know that I will send a message to find out and we will contact her if we need to change the appointment.  I did let [name] know that there will be a $25 copay for the Prolia  injection.

## 2023-12-08 ENCOUNTER — Ambulatory Visit

## 2023-12-12 ENCOUNTER — Ambulatory Visit: Admitting: Internal Medicine

## 2023-12-12 ENCOUNTER — Encounter: Payer: Self-pay | Admitting: Internal Medicine

## 2023-12-12 VITALS — BP 162/72 | HR 85 | Ht <= 58 in | Wt 110.4 lb

## 2023-12-12 DIAGNOSIS — S0990XS Unspecified injury of head, sequela: Secondary | ICD-10-CM

## 2023-12-12 DIAGNOSIS — R5383 Other fatigue: Secondary | ICD-10-CM

## 2023-12-12 DIAGNOSIS — I10 Essential (primary) hypertension: Secondary | ICD-10-CM | POA: Diagnosis not present

## 2023-12-12 DIAGNOSIS — R7301 Impaired fasting glucose: Secondary | ICD-10-CM

## 2023-12-12 DIAGNOSIS — D509 Iron deficiency anemia, unspecified: Secondary | ICD-10-CM | POA: Diagnosis not present

## 2023-12-12 DIAGNOSIS — M810 Age-related osteoporosis without current pathological fracture: Secondary | ICD-10-CM | POA: Diagnosis not present

## 2023-12-12 DIAGNOSIS — F0280 Dementia in other diseases classified elsewhere without behavioral disturbance: Secondary | ICD-10-CM | POA: Diagnosis not present

## 2023-12-12 DIAGNOSIS — E785 Hyperlipidemia, unspecified: Secondary | ICD-10-CM | POA: Diagnosis not present

## 2023-12-12 DIAGNOSIS — E559 Vitamin D deficiency, unspecified: Secondary | ICD-10-CM | POA: Diagnosis not present

## 2023-12-12 MED ORDER — DENOSUMAB 60 MG/ML ~~LOC~~ SOSY: 60.0000 mg | PREFILLED_SYRINGE | SUBCUTANEOUS | Status: AC

## 2023-12-12 NOTE — Patient Instructions (Signed)
 It was good to see Natalie Coleman today  Once her living situation has been changed,  I would like to reevaluate Natalie Coleman  to see if her mood has improved as well as her blood pressure

## 2023-12-12 NOTE — Assessment & Plan Note (Signed)
 Managed currently with Prolia  .  Dose  given today

## 2023-12-12 NOTE — Progress Notes (Unsigned)
 Subjective:  Patient ID: Natalie Coleman, female    DOB: [birth year]  Age: 83 y.o. MRN: [id number]  CC: The primary encounter diagnosis was Essential hypertension. Diagnoses of Hyperlipidemia with target LDL less than 130, Iron  deficiency anemia, unspecified iron  deficiency anemia type, Other fatigue, and Impaired fasting glucose were also pertinent to this visit.   HPI [name] presents for  Chief Complaint  Patient presents with   Medical Management of Chronic Issues   1) last seen in February  has gained 10 lb since visit   2) HTN: : elevated today without amlodipine  .  She is not checking at home and declines shortness of breath , chest pain.   3( mild cognitive impairment with anxiety :  still living at home,  alone,  not being supervised . Afraid to be alone.    Daughter [name] is planning to move in.  Patient picks apart the pill packs.  Patient denies anxiety but daughter notes that at night during phone conversations that she frequently endorses anxiety      Outpatient Medications Prior to Visit  Medication Sig Dispense Refill   ammonium lactate  (AMLACTIN) 12 % lotion Apply 1 application topically as needed for dry skin. 400 g 0   ASHWAGANDHA PO Take 1 tablet by mouth daily.     Calcium Citrate 250 MG TABS Take 2 tablets by mouth 3 (three) times daily.     Coenzyme Q10 100 MG TABS Take 1 tablet by mouth daily.     denosumab  (PROLIA ) 60 MG/ML SOSY injection Inject 60 mg into the skin every 6 (six) months. 1 mL 1   famotidine  (PEPCID ) 20 MG tablet Take 1 tablet (20 mg total) by mouth at bedtime. 30 tablet 1   Lactobacillus-Inulin (CULTURELLE DIGESTIVE HEALTH) CAPS Take 1 capsule by mouth daily. 14 capsule 0   Misc Natural Products (ADV TURMERIC CURCUMIN COMPLEX PO) Take 1 capsule by mouth daily.     Multiple Vitamins-Minerals (CENTRUM SILVER PO) Take 1 tablet by mouth daily.     vitamin C (ASCORBIC ACID) 500 MG tablet Take 500 mg by mouth daily.     amLODipine   (NORVASC ) 5 MG tablet Take 1 tablet (5 mg total) by mouth daily. (Patient not taking: Reported on 12/12/2023) 90 tablet 0   Melatonin 5 MG TABS Take 1 tablet by mouth as needed. (Patient not taking: Reported on 04/01/2023)     Facility-Administered Medications Prior to Visit  Medication Dose Route Frequency Provider Last Rate Last Admin   denosumab  (PROLIA ) injection 60 mg  60 mg Subcutaneous Once Jasaun Carn L, MD        Review of Systems;  Patient denies headache, fevers, malaise, unintentional weight loss, skin rash, eye pain, sinus congestion and sinus pain, sore throat, dysphagia,  hemoptysis , cough, dyspnea, wheezing, chest pain, palpitations, orthopnea, edema, abdominal pain, nausea, melena, diarrhea, constipation, flank pain, dysuria, hematuria, urinary  Frequency, nocturia, numbness, tingling, seizures,  Focal weakness, Loss of consciousness,  Tremor, insomnia, depression, anxiety, and suicidal ideation.      Objective:  BP (!) 162/72   Pulse 85   Ht 4' 6 (1.372 m)   Wt 110 lb 6.4 oz (50.1 kg)   SpO2 99%   BMI 26.62 kg/m   BP Readings from Last 3 Encounters:  12/12/23 (!) 162/72  04/01/23 130/78  03/23/23 (!) 164/88    Wt Readings from Last 3 Encounters:  12/12/23 110 lb 6.4 oz (50.1 kg)  04/01/23 100 lb 6.4 oz (  45.5 kg)  03/23/23 104 lb 3.2 oz (47.3 kg)    Physical Exam  Lab Results  Component Value Date   HGBA1C 5.6 11/12/2021    Lab Results  Component Value Date   CREATININE 0.89 04/01/2023   CREATININE 0.88 03/30/2022   CREATININE 0.76 11/12/2021    Lab Results  Component Value Date   WBC 8.1 05/10/2023   HGB 13.2 05/10/2023   HCT 38.9 05/10/2023   PLT 234.0 05/10/2023   GLUCOSE 81 04/01/2023   CHOL 227 (H) 11/12/2021   TRIG 95.0 11/12/2021   HDL 62.20 11/12/2021   LDLDIRECT 143.0 11/12/2021   LDLCALC 146 (H) 11/12/2021   ALT 9 05/10/2023   AST 16 05/10/2023   NA 143 04/01/2023   K 3.7 04/01/2023   CL 106 04/01/2023   CREATININE 0.89  04/01/2023   BUN 27 (H) 04/01/2023   CO2 27 04/01/2023   TSH 1.66 04/01/2023   HGBA1C 5.6 11/12/2021    MM 3D SCREEN BREAST BILATERAL Result Date: 09/05/2020 CLINICAL DATA:  Screening. EXAM: DIGITAL SCREENING BILATERAL MAMMOGRAM WITH TOMOSYNTHESIS AND CAD TECHNIQUE: Bilateral screening digital craniocaudal and mediolateral oblique mammograms were obtained. Bilateral screening digital breast tomosynthesis was performed. The images were evaluated with computer-aided detection. COMPARISON:  Previous exam(s). ACR Breast Density Category c: The breast tissue is heterogeneously dense, which may obscure small masses. FINDINGS: There are no findings suspicious for malignancy. IMPRESSION: No mammographic evidence of malignancy. A result letter of this screening mammogram will be mailed directly to the patient. RECOMMENDATION: Screening mammogram in one year. (Code:SM-B-01Y) BI-RADS CATEGORY  1: Negative. Electronically Signed   By: Alm Parkins M.D.   On: 09/05/2020 14:07  DEXAScan Result Date: 09/05/2020 EXAM: DUAL X-RAY ABSORPTIOMETRY (DXA) FOR BONE MINERAL DENSITY IMPRESSION: Your patient Natalie Coleman completed a BMD test on 09/04/2020 using the [employer] iDXA DXA System (software version: 14.10) manufactured by [employer]. The following summarizes the results of our evaluation. Technologist: SCE PATIENT BIOGRAPHICAL: Name: Natalie, Coleman Patient ID: [id number] Birth Date: Jun 23, 1940 Height: 53.5 in. Gender: Female Exam Date: 09/04/2020 Weight: 103.3 lbs. Indications: Advanced Age, Early Menopause, Height Loss, High Risk Meds, History of Fracture (Adult), History of Osteoporosis, Hysterectomy, Postmenopausal Fractures: Tibia/Fibula Treatments: Calcium, Multi-Vitamin, Prolia  DENSITOMETRY RESULTS: Site         Region     Measured Date Measured Age WHO Classification Young Adult T-score BMD         %Change vs. Previous Significant Change (*) DualFemur Neck Right 09/04/2020 81.8 Osteoporosis  -2.5 0.693 g/cm2 -2.9% - DualFemur Neck Right 01/12/2016 77.2 Osteopenia -2.3 0.714 g/cm2 - - DualFemur Total Mean 09/04/2020 81.8 Osteopenia -2.0 0.753 g/cm2 6.2% Yes DualFemur Total Mean 01/12/2016 77.2 Osteopenia -2.4 0.709 g/cm2 - - Left Forearm Radius 33% 09/04/2020 81.8 Osteoporosis -2.8 0.635 g/cm2 - - ASSESSMENT: The BMD measured at Forearm Radius 33% is 0.635 g/cm2 with a T-score of -2.8. This patient is considered osteoporotic according to World Health Organization [facility]) criteria. The scan quality is good. Lumbar spine was not utilized due to advanced degenerative changes. Compared with prior study, there has been a significant increase in the total hip. World [employer] Writer [facility]) criteria for post-menopausal, Caucasian Women: Normal:                   T-score at or above -1 SD Osteopenia/low bone mass: T-score between -1 and -2.5 SD Osteoporosis:             T-score at or below -2.5  SD RECOMMENDATIONS: 1. All patients should optimize calcium and vitamin D  intake. 2. Consider FDA-approved medical therapies in postmenopausal women and men aged 53 years and older, based on the following: a. A hip or vertebral(clinical or morphometric) fracture b. T-score < -2.5 at the femoral neck or spine after appropriate evaluation to exclude secondary causes c. Low bone mass (T-score between -1.0 and -2.5 at the femoral neck or spine) and a 10-year probability of a hip fracture > 3% or a 10-year probability of a major osteoporosis-related fracture > 20% based on the [place] -adapted WHO algorithm 3. Clinician judgment and/or patient preferences may indicate treatment for people with 10-year fracture probabilities above or below these levels FOLLOW-UP: People with diagnosed cases of osteoporosis or at high risk for fracture should have regular bone mineral density tests. For patients eligible for Medicare, routine testing is allowed once every 2 years. The testing frequency can be increased to one year for patients who  have rapidly progressing disease, those who are receiving or discontinuing medical therapy to restore bone mass, or have additional risk factors. I have reviewed this report, and agree with the above findings. Mark A. Vida, M.D. [facility] Electronically Signed   By: Oneil Vida M.D.   On: 09/05/2020 08:12    Assessment & Plan:  .Essential hypertension  Hyperlipidemia with target LDL less than 130  Iron  deficiency anemia, unspecified iron  deficiency anemia type  Other fatigue  Impaired fasting glucose     I spent 34 minutes on the day of this face to face encounter reviewing patient's  most recent visit with cardiology,  nephrology,  and neurology,  prior relevant surgical and non surgical procedures, recent  labs and imaging studies, counseling on weight management,  reviewing the assessment and plan with patient, and post visit ordering and reviewing of  diagnostics and therapeutics with patient  .   Follow-up: No follow-ups on file.   Verneita LITTIE Kettering, MD

## 2023-12-12 NOTE — Assessment & Plan Note (Signed)
 Untreated currently due to lack of supervision of medications , which is being addressed by daughter in the next several months.  Prior attempts to maange medications with monthly pill packs were unsuccessful because patient would empty several pill packs at the same time.

## 2023-12-12 NOTE — Assessment & Plan Note (Addendum)
 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 Seen in July 2024,  MRI brain and aricept declined by patient.   She has  progressed by my evaluation,  and her daughter note that patient allowed a stranger into there home for a visit  but could not recall who or why the visit was made.  ;  her untreated depression and anxiety are more likely aggravating her dysfunction and apathy .    HER daughter [name] is moving back in with her in the next several months,  which should help.  Will reevaluate after the living situation has been addressed.

## 2023-12-13 LAB — COMPREHENSIVE METABOLIC PANEL WITH GFR
ALT: 9 U/L (ref 0–35)
AST: 13 U/L (ref 0–37)
Albumin: 4.3 g/dL (ref 3.5–5.2)
Alkaline Phosphatase: 50 U/L (ref 39–117)
BUN: 31 mg/dL — ABNORMAL HIGH (ref 6–23)
CO2: 29 meq/L (ref 19–32)
Calcium: 9.3 mg/dL (ref 8.4–10.5)
Chloride: 104 meq/L (ref 96–112)
Creatinine, Ser: 0.92 mg/dL (ref 0.40–1.20)
GFR: 56.93 mL/min — ABNORMAL LOW (ref >60.00)
Glucose, Bld: 87 mg/dL (ref 70–99)
Potassium: 4 meq/L (ref 3.5–5.1)
Sodium: 142 meq/L (ref 135–145)
Total Bilirubin: 0.3 mg/dL (ref 0.2–1.2)
Total Protein: 6.6 g/dL (ref 6.0–8.3)

## 2023-12-13 LAB — CBC WITH DIFFERENTIAL/PLATELET
Basophils Absolute: 0.1 K/uL (ref 0.0–0.1)
Basophils Relative: 0.8 % (ref 0.0–3.0)
Eosinophils Absolute: 0.1 K/uL (ref 0.0–0.7)
Eosinophils Relative: 1.5 % (ref 0.0–5.0)
HCT: 39.1 % (ref 36.0–46.0)
Hemoglobin: 13 g/dL (ref 12.0–15.0)
Lymphocytes Relative: 18.2 % (ref 12.0–46.0)
Lymphs Abs: 1.6 K/uL (ref 0.7–4.0)
MCHC: 33.2 g/dL (ref 30.0–36.0)
MCV: 92.2 fl (ref 78.0–100.0)
Monocytes Absolute: 0.7 K/uL (ref 0.1–1.0)
Monocytes Relative: 8.4 % (ref 3.0–12.0)
Neutro Abs: 6.1 K/uL (ref 1.4–7.7)
Neutrophils Relative %: 71.1 % (ref 43.0–77.0)
Platelets: 267 K/uL (ref 150.0–400.0)
RBC: 4.24 Mil/uL (ref 3.87–5.11)
RDW: 13.5 % (ref 11.5–15.5)
WBC: 8.6 K/uL (ref 4.0–10.5)

## 2023-12-13 LAB — LIPID PANEL
Cholesterol: 221 mg/dL — ABNORMAL HIGH (ref 0–200)
HDL: 52.9 mg/dL (ref >39.00)
LDL Cholesterol: 137 mg/dL — ABNORMAL HIGH (ref 0–99)
NonHDL: 168.12
Total CHOL/HDL Ratio: 4
Triglycerides: 156 mg/dL — ABNORMAL HIGH (ref 0.0–149.0)
VLDL: 31.2 mg/dL (ref 0.0–40.0)

## 2023-12-13 LAB — VITAMIN D 25 HYDROXY (VIT D DEFICIENCY, FRACTURES): VITD: 32.95 ng/mL (ref 30.00–100.00)

## 2023-12-13 LAB — TSH: TSH: 0.95 u[IU]/mL (ref 0.35–5.50)

## 2023-12-13 LAB — HEMOGLOBIN A1C: Hgb A1c MFr Bld: 5.6 % (ref 4.6–6.5)

## 2023-12-13 LAB — LDL CHOLESTEROL, DIRECT: Direct LDL: 131 mg/dL

## 2023-12-13 NOTE — Assessment & Plan Note (Signed)
 Resolved with iron  supplementation. Rechecking today   Lab Results  Component Value Date   WBC 8.1 05/10/2023   HGB 13.2 05/10/2023   HCT 38.9 05/10/2023   MCV 92.4 05/10/2023   PLT 234.0 05/10/2023   Lab Results  Component Value Date   IRON  70 06/07/2022   TIBC 314 06/07/2022   FERRITIN 35 06/07/2022

## 2023-12-15 ENCOUNTER — Ambulatory Visit: Payer: Self-pay | Admitting: Internal Medicine

## 2023-12-16 NOTE — Telephone Encounter (Signed)
 Copied from CRM #[id number]. Topic: Clinical - Lab/Test Results >> Dec 16, 2023  1:58 PM Franky GRADE wrote: Reason for CRM: Patient's daughter [name] is returning a call she received from Karynn Deblasi, I advised of the lab results. She understood and had no questions but would like a copy of the results mailed to the home address.

## 2024-01-04 NOTE — Telephone Encounter (Signed)
 open in error

## 2024-03-19 ENCOUNTER — Ambulatory Visit

## 2024-05-30 ENCOUNTER — Ambulatory Visit
